# Patient Record
Sex: Female | Born: 1965 | Race: Black or African American | Hispanic: No | Marital: Married | Smoking: Former smoker
Health system: Southern US, Community
[De-identification: ages and names within clinical notes are randomized; demographics above are authoritative.]

## PROBLEM LIST (undated history)

## (undated) DIAGNOSIS — F419 Anxiety disorder, unspecified: Secondary | ICD-10-CM

## (undated) DIAGNOSIS — E119 Type 2 diabetes mellitus without complications: Secondary | ICD-10-CM

## (undated) DIAGNOSIS — D649 Anemia, unspecified: Secondary | ICD-10-CM

## (undated) DIAGNOSIS — N189 Chronic kidney disease, unspecified: Secondary | ICD-10-CM

## (undated) DIAGNOSIS — E042 Nontoxic multinodular goiter: Secondary | ICD-10-CM

## (undated) DIAGNOSIS — Z9109 Other allergy status, other than to drugs and biological substances: Secondary | ICD-10-CM

## (undated) DIAGNOSIS — E785 Hyperlipidemia, unspecified: Secondary | ICD-10-CM

## (undated) DIAGNOSIS — M5430 Sciatica, unspecified side: Secondary | ICD-10-CM

## (undated) DIAGNOSIS — I10 Essential (primary) hypertension: Secondary | ICD-10-CM

## (undated) DIAGNOSIS — E669 Obesity, unspecified: Secondary | ICD-10-CM

## (undated) DIAGNOSIS — F32A Depression, unspecified: Secondary | ICD-10-CM

## (undated) HISTORY — DX: Obesity, unspecified: E66.9

## (undated) HISTORY — DX: Morbid (severe) obesity due to excess calories: E66.01

## (undated) HISTORY — DX: Hyperlipidemia, unspecified: E78.5

## (undated) HISTORY — DX: Chronic kidney disease, unspecified: N18.9

## (undated) HISTORY — PX: PARTIAL HYSTERECTOMY: SHX80

## (undated) HISTORY — DX: Nontoxic multinodular goiter: E04.2

---
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
## 1997-08-20 ENCOUNTER — Emergency Department (HOSPITAL_COMMUNITY): Admission: EM | Admit: 1997-08-20 | Discharge: 1997-08-20 | Payer: Self-pay | Admitting: Emergency Medicine

## 1997-09-14 ENCOUNTER — Inpatient Hospital Stay (HOSPITAL_COMMUNITY): Admission: AD | Admit: 1997-09-14 | Discharge: 1997-09-14 | Payer: Self-pay | Admitting: Obstetrics

## 1997-09-17 ENCOUNTER — Inpatient Hospital Stay (HOSPITAL_COMMUNITY): Admission: AD | Admit: 1997-09-17 | Discharge: 1997-09-17 | Payer: Self-pay | Admitting: Obstetrics & Gynecology

## 1998-04-11 ENCOUNTER — Inpatient Hospital Stay (HOSPITAL_COMMUNITY): Admission: AD | Admit: 1998-04-11 | Discharge: 1998-04-11 | Payer: Self-pay | Admitting: Obstetrics & Gynecology

## 1998-04-11 ENCOUNTER — Encounter: Payer: Self-pay | Admitting: Obstetrics & Gynecology

## 1998-05-11 ENCOUNTER — Inpatient Hospital Stay (HOSPITAL_COMMUNITY): Admission: AD | Admit: 1998-05-11 | Discharge: 1998-05-11 | Payer: Self-pay | Admitting: Obstetrics

## 1998-05-23 ENCOUNTER — Encounter: Admission: RE | Admit: 1998-05-23 | Discharge: 1998-05-23 | Payer: Self-pay | Admitting: Obstetrics

## 1998-08-08 ENCOUNTER — Encounter: Admission: RE | Admit: 1998-08-08 | Discharge: 1998-08-08 | Payer: Self-pay | Admitting: Obstetrics

## 1999-10-31 ENCOUNTER — Inpatient Hospital Stay (HOSPITAL_COMMUNITY): Admission: AD | Admit: 1999-10-31 | Discharge: 1999-10-31 | Payer: Self-pay | Admitting: Obstetrics & Gynecology

## 1999-12-05 ENCOUNTER — Inpatient Hospital Stay (HOSPITAL_COMMUNITY): Admission: AD | Admit: 1999-12-05 | Discharge: 1999-12-05 | Payer: Self-pay | Admitting: *Deleted

## 1999-12-06 ENCOUNTER — Emergency Department (HOSPITAL_COMMUNITY): Admission: EM | Admit: 1999-12-06 | Discharge: 1999-12-06 | Payer: Self-pay | Admitting: *Deleted

## 1999-12-09 ENCOUNTER — Other Ambulatory Visit: Admission: RE | Admit: 1999-12-09 | Discharge: 1999-12-09 | Payer: Self-pay | Admitting: Obstetrics

## 1999-12-09 ENCOUNTER — Encounter: Admission: RE | Admit: 1999-12-09 | Discharge: 1999-12-09 | Payer: Self-pay | Admitting: Obstetrics & Gynecology

## 1999-12-11 ENCOUNTER — Ambulatory Visit (HOSPITAL_COMMUNITY): Admission: RE | Admit: 1999-12-11 | Discharge: 1999-12-11 | Payer: Self-pay | Admitting: *Deleted

## 2000-09-08 ENCOUNTER — Emergency Department (HOSPITAL_COMMUNITY): Admission: EM | Admit: 2000-09-08 | Discharge: 2000-09-08 | Payer: Self-pay | Admitting: Emergency Medicine

## 2000-09-09 ENCOUNTER — Emergency Department (HOSPITAL_COMMUNITY): Admission: EM | Admit: 2000-09-09 | Discharge: 2000-09-09 | Payer: Self-pay | Admitting: *Deleted

## 2002-05-18 ENCOUNTER — Inpatient Hospital Stay (HOSPITAL_COMMUNITY): Admission: AD | Admit: 2002-05-18 | Discharge: 2002-05-18 | Payer: Self-pay | Admitting: Family Medicine

## 2002-05-18 ENCOUNTER — Encounter: Payer: Self-pay | Admitting: Family Medicine

## 2002-11-09 ENCOUNTER — Inpatient Hospital Stay (HOSPITAL_COMMUNITY): Admission: AD | Admit: 2002-11-09 | Discharge: 2002-11-09 | Payer: Self-pay | Admitting: Obstetrics & Gynecology

## 2002-11-09 ENCOUNTER — Encounter: Payer: Self-pay | Admitting: Obstetrics & Gynecology

## 2002-11-28 ENCOUNTER — Encounter: Admission: RE | Admit: 2002-11-28 | Discharge: 2002-11-28 | Payer: Self-pay | Admitting: Obstetrics and Gynecology

## 2002-11-28 ENCOUNTER — Other Ambulatory Visit: Admission: RE | Admit: 2002-11-28 | Discharge: 2002-11-28 | Payer: Self-pay | Admitting: Obstetrics & Gynecology

## 2002-11-28 ENCOUNTER — Encounter (INDEPENDENT_AMBULATORY_CARE_PROVIDER_SITE_OTHER): Payer: Self-pay

## 2003-01-16 ENCOUNTER — Encounter: Admission: RE | Admit: 2003-01-16 | Discharge: 2003-01-16 | Payer: Self-pay | Admitting: Obstetrics and Gynecology

## 2003-01-23 ENCOUNTER — Emergency Department (HOSPITAL_COMMUNITY): Admission: EM | Admit: 2003-01-23 | Discharge: 2003-01-23 | Payer: Self-pay | Admitting: Emergency Medicine

## 2003-02-09 ENCOUNTER — Inpatient Hospital Stay (HOSPITAL_COMMUNITY): Admission: AD | Admit: 2003-02-09 | Discharge: 2003-02-09 | Payer: Self-pay | Admitting: Obstetrics & Gynecology

## 2003-02-12 ENCOUNTER — Emergency Department (HOSPITAL_COMMUNITY): Admission: EM | Admit: 2003-02-12 | Discharge: 2003-02-12 | Payer: Self-pay | Admitting: *Deleted

## 2003-10-19 ENCOUNTER — Ambulatory Visit: Payer: Self-pay | Admitting: Family Medicine

## 2003-10-19 ENCOUNTER — Inpatient Hospital Stay (HOSPITAL_COMMUNITY): Admission: AD | Admit: 2003-10-19 | Discharge: 2003-10-19 | Payer: Self-pay | Admitting: Family Medicine

## 2003-10-23 ENCOUNTER — Ambulatory Visit: Payer: Self-pay | Admitting: Obstetrics & Gynecology

## 2003-11-13 ENCOUNTER — Ambulatory Visit: Payer: Self-pay | Admitting: Obstetrics & Gynecology

## 2004-06-27 ENCOUNTER — Inpatient Hospital Stay (HOSPITAL_COMMUNITY): Admission: AD | Admit: 2004-06-27 | Discharge: 2004-06-27 | Payer: Self-pay | Admitting: *Deleted

## 2004-07-17 ENCOUNTER — Ambulatory Visit: Payer: Self-pay | Admitting: Obstetrics and Gynecology

## 2004-07-22 ENCOUNTER — Ambulatory Visit: Payer: Self-pay | Admitting: Obstetrics & Gynecology

## 2004-07-25 ENCOUNTER — Ambulatory Visit: Payer: Self-pay | Admitting: Family Medicine

## 2004-08-26 ENCOUNTER — Ambulatory Visit: Payer: Self-pay | Admitting: Obstetrics & Gynecology

## 2004-08-26 ENCOUNTER — Other Ambulatory Visit: Admission: RE | Admit: 2004-08-26 | Discharge: 2004-08-26 | Payer: Self-pay | Admitting: Obstetrics & Gynecology

## 2004-09-22 ENCOUNTER — Encounter (INDEPENDENT_AMBULATORY_CARE_PROVIDER_SITE_OTHER): Payer: Self-pay | Admitting: *Deleted

## 2004-09-22 ENCOUNTER — Ambulatory Visit: Payer: Self-pay | Admitting: Obstetrics & Gynecology

## 2004-09-22 ENCOUNTER — Inpatient Hospital Stay (HOSPITAL_COMMUNITY): Admission: AD | Admit: 2004-09-22 | Discharge: 2004-09-23 | Payer: Self-pay | Admitting: Obstetrics & Gynecology

## 2004-11-18 ENCOUNTER — Ambulatory Visit: Payer: Self-pay | Admitting: Obstetrics and Gynecology

## 2005-01-26 HISTORY — PX: ABDOMINAL HYSTERECTOMY: SHX81

## 2005-01-26 HISTORY — PX: TOTAL VAGINAL HYSTERECTOMY: SHX2548

## 2007-06-23 ENCOUNTER — Emergency Department (HOSPITAL_COMMUNITY): Admission: EM | Admit: 2007-06-23 | Discharge: 2007-06-23 | Payer: Self-pay | Admitting: Emergency Medicine

## 2007-06-23 ENCOUNTER — Encounter (INDEPENDENT_AMBULATORY_CARE_PROVIDER_SITE_OTHER): Payer: Self-pay | Admitting: Emergency Medicine

## 2007-06-23 ENCOUNTER — Ambulatory Visit: Payer: Self-pay | Admitting: Vascular Surgery

## 2009-08-06 ENCOUNTER — Emergency Department (HOSPITAL_COMMUNITY): Admission: EM | Admit: 2009-08-06 | Discharge: 2009-08-07 | Payer: Self-pay | Admitting: Emergency Medicine

## 2010-06-13 NOTE — Group Therapy Note (Signed)
NAME:  CEDRICA, BRUNE NO.:  [id number]   MEDICAL RECORD NO.:  [id number]          PATIENT TYPE:  WOC   LOCATION:  [facility]                   FACILITY:  [facility]   PHYSICIAN:  Argentina Donovan, MD        DATE OF BIRTH:  July 14, 1965   DATE OF SERVICE:  11/18/2004                                    CLINIC NOTE   The patient is a 45 year old black female who on the 28th of last month  underwent total vaginal hysterectomy.  Benign proliferative endometrium,  disorganized was her diagnosis post pathology.  On examination she had no  significant symptoms.  The vaginal cuff completely healed and there is very  little pain on bimanual examination.  Because of the habitus of the patient  I could not feel the ovaries.  She weighs 301 pounds.   DIAGNOSES:  Post vaginal hysterectomy, well healed.  Patient discharged.  Will order her lisinopril for blood pressure and renew her __________ .           ______________________________  Argentina Donovan, MD     PR/MEDQ  D:  11/18/2004  T:  11/19/2004  Job:  [id number]

## 2010-06-13 NOTE — Op Note (Signed)
NAME:  Rebecca Avila, Rebecca Avila                  ACCOUNT NO.:  [id number]   MEDICAL RECORD NO.:  [id number]          PATIENT TYPE:  INP   LOCATION:  9399                          [facility]   PHYSICIAN:  Lesly Dukes, M.D. DATE OF BIRTH:  [birth year]   DATE OF PROCEDURE:  09/22/2004  DATE OF DISCHARGE:                                 OPERATIVE REPORT   PREOPERATIVE DIAGNOSIS:  A 45 year old female with menorrhagia, fibroids,  adenomas, and anemia.   POSTOPERATIVE DIAGNOSIS:  A 45 year old female with menorrhagia, fibroids,  adenomas, and anemia.   PROCEDURE:  Transvaginal hysterectomy.   ASSISTANT:  Tanya S. Shawnie Pons, M.D.   ANESTHESIA:  General.   PATHOLOGY:  Uterus and cervix.   ESTIMATED BLOOD LOSS:  300.   COMPLICATIONS:  None.   FINDINGS:  Enlarged uterus weighing 214 g, mild cystocele, grossly normal  ovaries visually.   PROCEDURE:  After informed consent was obtained, the patient was taken to  the operating room and general anesthesia was induced.  The patient was  placed in the dorsal lithotomy position and dressed in the normal sterile  fashion.  Retractors were placed through the vagina and the cervix was  grasped with Christella Hartigan tenaculum.  The cervix was infiltrated with a 1%  lidocaine with 1:200,000 epinephrine solution.  The cervix was then  circumferentially incised with a scalpel until the level of the endopelvic  fascia.  The peritoneum was then identified anteriorly and entered with the  Metzenbaum scissors.  The Pratts were placed intraperitoneally.  The same  procedure was used to enter the peritoneum posteriorly and a long weighted  speculum was placed into the peritoneal cavity.  Heaney clamps were used to  clamp the uterosacral ligaments and uterine arteries.  Each pedicle was then  transected and the tissue ligated with 0 Vicryl.  Good hemostasis was noted.  The uterus was then bivalved once the uterine arteries were transected and  found to be hemostatic.   The utero-ovarian pedicles were then doubly  clamped, transected, and suture ligated with 0 Vicryl x2.  Good hemostasis  was noted from the utero-ovarian pedicles.  At this time, we saw the  ovaries, which looked grossly normal.  The vaginal cuff and posterior  peritoneum were then run in a locked fashion to aid hemostasis.  All  pedicles were examined one last time and all pedicles were noted to be  hemostatic.  The vaginal cuff was then closed with 0 Vicryl in a running  fashion.  The vagina was packed with 2 inch packing  tape soaked in Cleocin cream.  The patient tolerated the procedure well.  Sponge, laparotomy pad, instrument, and needle count were correct x2.  The  patient went to the recovery room in stable condition.  A Foley was placed  in the bladder at the end of the procedure.           ______________________________  Lesly Dukes, M.D.     KHL/MEDQ  D:  09/22/2004  T:  09/22/2004  Job:  [id number]

## 2010-06-13 NOTE — Group Therapy Note (Signed)
NAME:  Rebecca Avila, Rebecca Avila NO.:  [id number]   MEDICAL RECORD NO.:  [id number]          PATIENT TYPE:  WOC   LOCATION:  [facility]                   [facility]ACILITY[facility]   PHYSICIAN:  Elsie Lincoln, MD      DATE OF BIRTH:  08-29-1965   DATE OF SERVICE:  07/22/2004                                    CLINIC NOTE   HISTORY:  The patient is a 45 year old P70 female with LMP June 26, 2004, who  re-presents to the clinic for a complaint of severe dysmenorrhea.  The  patient most likely has adenomyosis.  She was scheduled for a hysterectomy  in 2005, after receiving Lupron to help her menorrhagia and anemia.  She  never showed up for surgery.  There were some sexual issues, and these are  outlined in previous notes.   The patient now is ready to have a hysterectomy, and has Medicaid.  Her  hemoglobin today is 10, and she desires to get one more shot of Lupron to  prevent her from having menses until surgery can be scheduled.  She is still  hypertensive, which has not received treatment.  I told her that she needs  to see a doctor before I schedule the surgery.  She also needs an  endometrial biopsy and Pap smear, as it has been almost two years.  The  patient is scheduled for that in three weeks.  In the meantime, she will  come back on Friday for a Lupron injection.  She is also given a  prescription for Vicodin to help her with the menses that will occur after  getting the Lupron.   The patient denies any new medical problems or surgeries.  We will schedule  her for a vaginal hysterectomy in the next four to six weeks.       KL/MEDQ  D:  07/22/2004  T:  07/22/2004  Job:  [id number]

## 2010-06-13 NOTE — Group Therapy Note (Signed)
NAME:  Rebecca Avila, Rebecca Avila NO.:  [id number]   MEDICAL RECORD NO.:  [id number]          PATIENT TYPE:  WOC   LOCATION[facility]                   [facility]   PHYSICIAN:  Elsie Lincoln, MD      DATE OF BIRTH:  16-Jan-1966   DATE OF SERVICE:  10/23/2003                                    CLINIC NOTE   REASON FOR VISIT:  The patient is a 45 year old female para 1-0-1-1 who was  worked up last year for a hysterectomy for adenomyosis.  The patient had  received blood transfusion secondary to a low hematocrit and also Lupron in  order to help her anemia.  The patient was scheduled for surgery early  January and was placed on iron.  She was sent four certified letters that  were all returned.  The patient states that she was not in a good state to  have the surgery at that time.  The patient then presented to the MAU on  October 19, 2003 with pain.  She was given a prescription for Percocet and  she had a hemoglobin of 9.4 that day.  Today she presents now desiring  hysterectomy.  She now has a stable house, her husband is out of jail, and  she has a working phone number.  She was given a shot of Lupron today in  order to help shrink the uterus again and aid in resolving her anemia.  Her  blood pressure today was 169/103.  She was told back in 2004 to get follow-  up for this hypertension before the surgery.  Now that she has gained 20  more pounds, her blood pressure is higher.  She was given an appointment  today for family practice in order to get her blood pressure under control  prior to surgery.  Once again, the patient was give a prescription for  Lupron and is going to come back tomorrow for the injection, and then return  to clinic in 8 weeks for a preoperative visit and to schedule surgery.  The  patient understands that we will attempt a transvaginal hysterectomy;  however, if not possible we will proceed with transabdominal hysterectomy.      KL/MEDQ   D:  10/23/2003  T:  10/23/2003  Job:  [id number]

## 2010-06-13 NOTE — Discharge Summary (Signed)
NAME:  Rebecca Avila, Rebecca Avila                  ACCOUNT NO.:  [id number]   MEDICAL RECORD NO.:  [id number]          PATIENT TYPE:  INP   LOCATION:  9305                          [facility]   PHYSICIAN:  Lesly Dukes, M.D. DATE OF BIRTH:  [birth year]   DATE OF ADMISSION:  09/22/2004  DATE OF DISCHARGE:  09/23/2004                                 DISCHARGE SUMMARY   ADMISSION DIAGNOSES:  1.  Menorrhagia.  2.  Fibroids.  3.  Adenomas.  4.  Anemia.   DISCHARGE DIAGNOSIS:  Status post transvaginal hysterectomy for menorrhagia,  fibroids, adenomas and anemia.   PERTINENT LABORATORY DATA:  Discharge hemoglobin 8.9, hematocrit 29.0, white  count 13.2 thousand, platelets 392,000.   HOSPITAL COURSE:  this is a 45 year old African-American female with a  history of menorrhagia, fibroids and adenomas who underwent a total vaginal  hysterectomy without any complications. The patient was discharged home on  postoperative day number 1 in good condition. Her blood pressures were  elevated during her hospital stay so she was started on Lisinopril 5 mg  daily and will continue this at discharge. The patient remained afebrile  throughout her hospital course and was ambulating and tolerating p.o. on the  day of discharge.   DISPOSITION:  She is discharged home on a regular diet. Activity as  tolerated.   MEDICATIONS:  1.  Lisinopril 5 mg p.o. daily.  2.  Percocet 5/325 mg 1-2 tablets q.4-6h p.r.n. pain.   DISCHARGE INSTRUCTIONS:  She is to take all medications as prescribed. She  is to follow up with Dr. Penne Lash at our clinic in 2 weeks.      Altamese Cabal, M.D.    ______________________________  Lesly Dukes, M.D.    KS/MEDQ  D:  09/23/2004  T:  09/24/2004  Job:  [id number]

## 2010-06-13 NOTE — H&P (Signed)
NAME:  Rebecca Avila, Rebecca Avila NO.:  [id number]   MEDICAL RECORD NO.:  [id number]          PATIENT TYPE:  WOC   LOCATION:  [facility]                   [facility]ACILITY[facility]   PHYSICIAN:  Elsie Lincoln, MD      DATE OF BIRTH:  Oct 26, 1965   DATE OF SERVICE:  08/26/2004                             PRE-OP HISTORY & PHYSICAL   The patient is a 45 year old G2, para 1-0-1-1 who has had severe  dysmenorrhea and menorrhagia since 2004.  The patient has been on Lupron to  help her anemia.  She most likely has adenomyosis on ultrasound.  She does  not desire to have any birth control pills or endometrial ablation and these  things are unlikely to work given it is probably adenomyosis.  Patient is  scheduled for a transvaginal hysterectomy on August 28 at 9:30.   PAST MEDICAL HISTORY:  Hypertension.  Patient has not gone for medication  for this at a primary care physician.  I have given her hydrochlorothiazide  in the past; however, she says it makes her jittery.  Patient was started on  lisinopril today 5 mg p.o. daily.  Patient is to come in if there is  increased swelling or a hacking dry cough.   PAST SURGICAL HISTORY:  None.   PAST GYNECOLOGICAL HISTORY:  One spontaneous AB and one NSVD of an 8 pound  baby.  Patient denies history of sexually transmitted diseases, ovarian  cysts, fibroids, or Pap smears that are not normal.   SOCIAL HISTORY:  Patient is a smoker of approximately three cigarettes a  day.  No alcohol or drugs.   FAMILY HISTORY:  No breast, uterine, colon, or ovarian cancer.  No diabetes.   REVIEW OF SYSTEMS:  No chest pain, shortness of breath, nausea, vomiting,  diarrhea, or change in bladder habits.  She does have occasional headache,  but none today.   PHYSICAL EXAMINATION:  VITAL SIGNS:  Temperature 97.3, pulse 85, blood  pressure 149/100, weight 298.3, height 5 feet 10 inches.  GENERAL:  Well-nourished, well-developed, in no apparent distress.  NECK:   Supple, enlarged bilateral homogenous thyroid.  However, patient  states she has had multiple TSHs in the past and they have all been normal.  CHEST:  Clear to auscultation bilaterally.  CARDIOVASCULAR:  Regular rate and rhythm.  ABDOMEN:  Soft, obese, nontender, nondistended.  No rebound.  No guarding.  GENITALIA:  Tanner V.  There is a small folliculitis on the left labia  majora that is draining.  Patient has had several of these now that the  weather is hot and she is encouraged to take sitz baths twice a day.  Vagina  pink, normal rugae.  Cervix closed, nontender.  No CMT.  Uterus anteverted,  slightly enlarged.  Adnexa:  No masses.  Nontender.  EXTREMITIES:  Slight edema.  No clubbing in the lower extremities.   ASSESSMENT/PLAN:  A 45 year old female with adenomyosis, severe  dysmenorrhea, and anemia.  1.  TVH on August 28.  2.  Check TSH and CMP today as starting an ACE.  3.  Lisinopril 5 mg p.o. daily.  Patient is to show up for surgery on the      28th.       KL/MEDQ  D:  08/26/2004  T:  08/26/2004  Job:  [id number]

## 2010-06-13 NOTE — Group Therapy Note (Signed)
NAME:  Rebecca Avila, Rebecca Avila NO.:  [id number]   MEDICAL RECORD NO.:  [id number]                   PATIENT TYPE:  OUT   LOCATION:  [facility]                           [facility]ACILITY[facility]   PHYSICIAN:  Elsie Lincoln, MD                   DATE OF BIRTH:  January 25, 1966   DATE OF SERVICE:  11/28/2002                                    CLINIC NOTE   REASON FOR VISIT:  This is a 45 year old G 2, para 1-0-1-1 who presents for  followup of a MAU visit on November 09, 2002.  The patient was having her  period at that time and having a lot of abdominal pain.  She had been  diagnosed with adenomyosis previously and had been having painful periods  for several years.  The patient states she is through with childbearing and  would like a hysterectomy.  On the day that she was seen in the MAU, she was  noted to be anemic with a hemoglobin of 7.8.  GC, Chlamydia, and wet prep  were negative.  The uterus on ultrasound was mildly enlarged measuring 11.9  x 7.6 x 7.5.  The endometrial stripe was ill-defined but was within normal  limits about 7.5 mm.  The myometrial echotexture was heterogenous,  particularly anteriorly, consistent with adenomyosis.  She was noted to be  hypertensive at her MAU visit and was instructed to get treated for her  hypertension by a primary medical doctor.   PAST MEDICAL HISTORY:  Hypertension.   PAST SURGICAL HISTORY:  None.   PAST GYNECOLOGICAL HISTORY:  One spontaneous abortion, one NSVD of an 8  pound baby.  No sexually transmitted diseases.  No ovarian cysts.  No  fibroids, only adenomyosis as described above.   SOCIAL HISTORY:  The patient is a smoker approximately 3 cigarettes a day.  No alcohol.  No drugs.   FAMILY HISTORY:  No breast, uterine, colon, or ovarian cancers.  No  diabetes.   REVIEW OF SYSTEMS:  No chest pain, shortness of breath, nausea, vomiting,  diarrhea, or change in bladder habits.  The patient does have migraines.  Experiences occasional headaches.   PHYSICAL EXAMINATION:  VITAL SIGNS:  Pulse 77, blood pressure 159/96, weight  293.1, height 5 feet 10 inches.  BREASTS:  No masses.  No skin changes.  Nontender.  ABDOMEN:  Obese.  Nontender.  Distended.  EXTERNAL GENITALIA:  Tanner V.  VAGINA:  Normal pink mucosa.  No discharge or blood.  CERVIX:  Closed, nontender.  No lesion.  UTERUS:  Difficult to palpate but nontender.  This is secondary to body  habitus.  ADNEXA:  No masses.  Nontender.   ASSESSMENT:  A 45 year old para 1-0-1-1 who is finished with childbearing  and had suffered from adenomyosis and desiring hysterectomy.  The patient  also has severe anemia secondary to her heavy menses.   PLAN:  1. Pap smear done today.  2. Endometrial biopsy done today.  3. The patient advised to quit smoking and change diet to help weight loss.  4. Referred to [facility] within a week to get treated for     hypertension.  5. The patient amenorrheic secondary to a three-month dose of Lupron that     she received in MAU.  Desires to have hysterectomy within the three     months so the patient will not experience menses again.  6. Continue iron three times a day.  7. Return to clinic in six weeks for preop, consultation, consent, and labs.                                               Elsie Lincoln, MD    KL/MEDQ  D:  11/28/2002  T:  11/28/2002  Job:  [id number]

## 2010-06-13 NOTE — Group Therapy Note (Signed)
NAME:  Rebecca Avila, Rebecca Avila NO.:  [id number]   MEDICAL RECORD NO.:  [id number]                   PATIENT TYPE:  OUT   LOCATION:  [facility]                           [facility]ACILITY:  [facility]   PHYSICIAN:  Elsie Lincoln, MD                   DATE OF BIRTH:  1965/09/02   DATE OF SERVICE:  01/16/2003                                    CLINIC NOTE   REPORT TITLE:  GYNECOLOGY CLINIC   REASON FOR VISIT:  A 45 year old female para 1-0-1-1.  This is the  preoperative visit for a hysterectomy.  The patient has a history of anemia  from menorrhagia and lots of abdominal pain from adenomyosis.  The patient  received Lupron on November 09, 2002 and has been amenorrheic and undergoing  the climacteric symptoms with medical menopause.  Pap smear and endometrial  biopsy were negative since then.  The patient still desires a hysterectomy.  She does not want medical management or a Mirena IUD.  The patient  understands that the risks of this procedure includes but is not limited to  bleeding, infection, damage to the bowel and bladder and major blood  vessels.  After discussion about route we will attempt a vaginal  hysterectomy.  However, if there is not a lot of descent of the uterus then  we will proceed with abdominal hysterectomy via Pfannenstiel incision.  The  patient agrees to this.  The ovaries will be left behind.   Of note, the patient did not go to the medical appointment to have her  borderline hypertension evaluated.  Today it is 139/91, weight is 287.6 and  this is a 5 pound weight loss.  The patient has not been taking the iron  that has been prescribed.  Her last CBC her hemoglobin was 7.8 in October.  I believe this has gone up as she has been amenorrheic for several months.  We will schedule this surgery early January before the Lupron stops taking  effect.  The patient is given another prescription and samples for Chromagen  and once again we will schedule the  surgery early January.  Colace is also  given for constipation and the patient is to do a bowel prep with GoLYTELY  starting at 3 p.m. the day before the surgery.                                               Elsie Lincoln, MD    KL/MEDQ  D:  01/16/2003  T:  01/16/2003  Job:  [id number]

## 2011-02-05 ENCOUNTER — Observation Stay (HOSPITAL_COMMUNITY)
Admission: EM | Admit: 2011-02-05 | Discharge: 2011-02-06 | Disposition: A | Discharge status: Home / Self Care | Payer: Self-pay | Attending: Emergency Medicine | Admitting: Emergency Medicine

## 2011-02-05 ENCOUNTER — Encounter (HOSPITAL_COMMUNITY): Payer: Self-pay

## 2011-02-05 DIAGNOSIS — T782XXA Anaphylactic shock, unspecified, initial encounter: Secondary | ICD-10-CM

## 2011-02-05 DIAGNOSIS — T39091A Poisoning by salicylates, accidental (unintentional), initial encounter: Secondary | ICD-10-CM | POA: Insufficient documentation

## 2011-02-05 DIAGNOSIS — I10 Essential (primary) hypertension: Secondary | ICD-10-CM | POA: Insufficient documentation

## 2011-02-05 DIAGNOSIS — F17200 Nicotine dependence, unspecified, uncomplicated: Secondary | ICD-10-CM | POA: Insufficient documentation

## 2011-02-05 DIAGNOSIS — Y92009 Unspecified place in unspecified non-institutional (private) residence as the place of occurrence of the external cause: Secondary | ICD-10-CM | POA: Insufficient documentation

## 2011-02-05 DIAGNOSIS — T39094A Poisoning by salicylates, undetermined, initial encounter: Principal | ICD-10-CM | POA: Insufficient documentation

## 2011-02-05 HISTORY — DX: Essential (primary) hypertension: I10

## 2011-02-05 HISTORY — DX: Other allergy status, other than to drugs and biological substances: Z91.09

## 2011-02-05 LAB — BASIC METABOLIC PANEL
CO2: 25 mEq/L (ref 19–32)
Calcium: 10.8 mg/dL — ABNORMAL HIGH (ref 8.4–10.5)
Glucose, Bld: 124 mg/dL — ABNORMAL HIGH (ref 70–99)

## 2011-02-05 MED ORDER — EPINEPHRINE 0.3 MG/0.3ML IJ DEVI
INTRAMUSCULAR | Status: AC
  Administered 2011-02-05: 0.3 mg via INTRAMUSCULAR
  Filled 2011-02-05: qty 0.3

## 2011-02-05 MED ORDER — FAMOTIDINE 10 MG PO TABS: ORAL_TABLET | ORAL | Status: DC

## 2011-02-05 MED ORDER — FAMOTIDINE IN NACL 20-0.9 MG/50ML-% IV SOLN: 20.0000 mg | Freq: Two times a day (BID) | INTRAVENOUS | Status: DC

## 2011-02-05 MED ORDER — METHYLPREDNISOLONE SODIUM SUCC 125 MG IJ SOLR
125.0000 mg | Freq: Once | INTRAMUSCULAR | Status: AC
  Administered 2011-02-05: 125 mg via INTRAVENOUS
  Filled 2011-02-05: qty 2

## 2011-02-05 MED ORDER — EPINEPHRINE 0.3 MG/0.3ML IJ DEVI
0.3000 mg | Freq: Once | INTRAMUSCULAR | Status: AC
  Administered 2011-02-05: 0.3 mg via INTRAMUSCULAR

## 2011-02-05 MED ORDER — DIPHENHYDRAMINE HCL 25 MG PO CAPS: 25.0000 mg | ORAL_CAPSULE | Freq: Four times a day (QID) | ORAL | Status: DC | PRN

## 2011-02-05 MED ORDER — DIPHENHYDRAMINE HCL 50 MG/ML IJ SOLN
50.0000 mg | Freq: Once | INTRAMUSCULAR | Status: AC
  Administered 2011-02-05: 50 mg via INTRAVENOUS
  Filled 2011-02-05: qty 1

## 2011-02-05 MED ORDER — METHYLPREDNISOLONE SODIUM SUCC 125 MG IJ SOLR
125.0000 mg | Freq: Four times a day (QID) | INTRAMUSCULAR | Status: DC
  Administered 2011-02-05: 125 mg via INTRAVENOUS
  Filled 2011-02-05: qty 2

## 2011-02-05 MED ORDER — PREDNISONE 10 MG PO TABS: 20.0000 mg | ORAL_TABLET | Freq: Two times a day (BID) | ORAL | Status: DC

## 2011-02-05 MED ORDER — RANITIDINE HCL 150 MG/10ML PO SYRP
150.0000 mg | ORAL_SOLUTION | Freq: Once | ORAL | Status: AC
  Administered 2011-02-05: 150 mg via ORAL
  Filled 2011-02-05 (×2): qty 10

## 2011-02-05 MED ORDER — EPINEPHRINE 0.3 MG/0.3ML IJ DEVI: 0.3000 mg | INTRAMUSCULAR | Status: DC | PRN

## 2011-02-05 MED ORDER — ONDANSETRON HCL 4 MG/2ML IJ SOLN
4.0000 mg | Freq: Once | INTRAMUSCULAR | Status: AC
  Administered 2011-02-05: 4 mg via INTRAVENOUS
  Filled 2011-02-05: qty 2

## 2011-02-05 MED ORDER — FAMOTIDINE 20 MG PO TABS
20.0000 mg | ORAL_TABLET | Freq: Two times a day (BID) | ORAL | Status: DC
  Administered 2011-02-05: 20 mg via ORAL
  Filled 2011-02-05: qty 1

## 2011-02-05 NOTE — ED Notes (Signed)
Bedside report given to [facility]

## 2011-02-05 NOTE — ED Notes (Signed)
Pt states that she was having some abdominal issues this afternoon so she decided to take some alka seltzer. She started to feel itching in the back of her throat and looked at the ingredient list and saw that it contained asa. 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 Pt states that her eyes are swelling and her throat is itching and she feels like her lower lip is starting to swell. Pt took 25mg  benadryl po pta with no relief. Alert and oriented.

## 2011-02-05 NOTE — ED Provider Notes (Signed)
History     CSN: [id number]  Arrival date & time 02/05/11  1642   First MD Initiated Contact with Patient 02/05/11 1705      Chief Complaint  Patient presents with  . Allergic Reaction    (Consider location/radiation/quality/duration/timing/severity/associated sxs/prior treatment) HPI history obtained from patient Level V caveat for urgent intervention needed Patient is a 46 year old female who presents with allergic reaction. 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 She had mild indigestion earlier today and took one dose of Alka-Seltzer. This is about 1-1-1/2 hours prior to arrival. Shortly after that she started noticing tingling in her lips as well as facial swelling. She had mild erythematous, pruritic rash on her face and left forearm. She also had subjective sensation of her throat closing up. She immediately came to the emergency department once the symptoms started. The symptoms have been constant and progressively worsening. She has not had any wheezing. No syncope, nausea, vomiting, diarrhea. She has had extreme reaction to above-mentioned medications similar to this in past. She has never required intubation. She does not have an EpiPen. Overall severity moderate to severe.  Past Medical History  Diagnosis Date  . Hypertension   . Environmental allergies     Past Surgical History  Procedure Date  . Partial hysterectomy     History reviewed. No pertinent family history.  History  Substance Use Topics  . Smoking status: Current Everyday Smoker  . Smokeless tobacco: Not on file  . Alcohol Use: No    OB History    Grav Para Term Preterm Abortions TAB SAB Ect Mult Living                  Review of Systems  Constitutional: Negative for fever and chills.  HENT: Negative for facial swelling.   Eyes: Negative for visual disturbance.  Respiratory: Positive for shortness of breath. Negative for cough and wheezing.   Cardiovascular: Negative for chest  pain.  Gastrointestinal: Negative for nausea, vomiting, abdominal pain and diarrhea.  Genitourinary: Negative for difficulty urinating.  Skin: Negative for rash.  Neurological: Negative for weakness and numbness.  Psychiatric/Behavioral: Negative for behavioral problems and confusion.  All other systems reviewed and are negative.    Allergies  Aspirin and Ibuprofen  Home Medications  No current outpatient prescriptions on file.  BP 171/96  Pulse 81  Temp(Src) 98 F (36.7 C) (Oral)  Resp 17  SpO2 98%  Physical Exam  Nursing note and vitals reviewed. Constitutional: She is oriented to person, place, and time. She appears well-developed and well-nourished. No distress.  HENT:       Moderate bilateral upper eyelid swelling. Patient still able to open eyes. Mild to moderate lower lip edema. No trismus. Patient will open her mouth fully and extend tongue. No tongue swelling. Subjective sensation of throat swelling up but no objective findings to support this. Mild erythematous rash over bilateral eyes.  Eyes: EOM are normal.  Neck: Normal range of motion. Neck supple.  Cardiovascular: Normal rate, regular rhythm and intact distal pulses.   No murmur heard. Pulmonary/Chest: Effort normal and breath sounds normal. No respiratory distress. She has no wheezes. She exhibits no tenderness.  Abdominal: Soft. She exhibits no distension. There is no tenderness.  Musculoskeletal: Normal range of motion. She exhibits no edema and no tenderness.       No calf TTP  Neurological: She is alert and oriented to person, place, and time.       Normal strength  Skin:  Skin is warm and dry. No rash noted. She is not diaphoretic.  Psychiatric: She has a normal mood and affect. Her behavior is normal. Thought content normal.    ED Course  Procedures (including critical care time)  Labs Reviewed  BASIC METABOLIC PANEL - Abnormal; Notable for the following:    Glucose, Bld 124 (*)    Calcium 10.8  (*)    GFR calc non Af Amer 70 (*)    GFR calc Af Amer 81 (*)    All other components within normal limits  BASIC METABOLIC PANEL   No results found.   1. Anaphylaxis       MDM   Based on patient's known allergy to salicylates and abrupt onset of facial swelling, dyspnea, erythematous rash shortly after taking Alka-Seltzer, clinical picture is consistent with anaphylactic reaction. Patient immediately given IM epinephrine, Solu-Medrol, Benadryl, H2 blocker. On initial exam she did have facial swelling but did not have significant respiratory compromise or hypoxia. She did not have wheezing or other evidence of distal constriction. She had significant symptomatic improvement with her initial interventions. Patient placed in allergic reaction observation protocol and transfer to CDU in improved and stable condition. There was no need to re dose epinephrine. Patient does have hypertension and has chronic lower extremity edema. Based on this screening metabolic panel done which was unremarkable. Patient will be discharged home with EpiPen.        Milus Glazier 02/05/11 2157

## 2011-02-05 NOTE — ED Notes (Signed)
Assumed care from [place], [place]. Pt assessed. Pt continues to have significant eye and tounge edema. Airway intact with clear breath sounds. Lips reportedly swollen as well. VSS. Will continue to monitor

## 2011-02-05 NOTE — ED Notes (Signed)
After patient was laying on stretcher stated slight nausea notified EDP administered antinausea medication and currently nausea resolved.

## 2011-02-05 NOTE — ED Notes (Signed)
Patient states at 1530 developed acid reflux and indigestion Alka seltzer.  At 1600 patient felt lips burning and swelling. Eyes itching with swelling periorbital both eyes, and itchy throat.  Took benadryl 1620 25mg  po. No relief. Upon arrival to ED Airway intact Bilateral equal chest rise and fall clear lung sounds in all fields. Periorbital swelling both eyes and itching throat. Ax4.  After patient received medication patient currently states itchy throat improving and burning sensation lips resolved. Swelling periorbital still present.

## 2011-02-05 NOTE — ED Provider Notes (Signed)
46 year old female with a history of aspirin allergy and developed an allergic reaction following taking a dose of Alka-Seltzer. She was not aware that Alka-Seltzer contains aspirin. She's been treated in the emergency department with steroids, epinephrine, H1 and H2 blockers and is feeling much better. She's not having any difficulty with her secretions, but she still has some swelling in her eyes and face. Incidentally, she has significant extremity edema which apparently is chronic and she was not aware of.  Rebecca Booze, MD 02/05/11 [phone]

## 2011-02-05 NOTE — ED Provider Notes (Signed)
I saw and evaluated the patient, reviewed the resident's note and I agree with the findings and plan.  CRITICAL CARE Performed by: Dione Booze   Total critical care time: 35 minutes  Critical care time was exclusive of separately billable procedures and treating other patients.  Critical care was necessary to treat or prevent imminent or life-threatening deterioration.  Critical care was time spent personally by me on the following activities: development of treatment plan with patient and/or surrogate as well as nursing, discussions with consultants, evaluation of patient's response to treatment, examination of patient, obtaining history from patient or surrogate, ordering and performing treatments and interventions, ordering and review of laboratory studies, ordering and review of radiographic studies, pulse oximetry and re-evaluation of patient's condition.   Dione Booze, MD 02/05/11 [phone]

## 2011-02-05 NOTE — ED Notes (Signed)
Provider notified of early dose methylpredisolone  adminstration time to be change to 23:30 per EDP

## 2011-05-20 ENCOUNTER — Encounter (HOSPITAL_COMMUNITY): Payer: Self-pay | Admitting: Emergency Medicine

## 2011-05-20 ENCOUNTER — Emergency Department (HOSPITAL_COMMUNITY): Payer: Self-pay

## 2011-05-20 ENCOUNTER — Emergency Department (HOSPITAL_COMMUNITY)
Admission: EM | Admit: 2011-05-20 | Discharge: 2011-05-21 | Disposition: A | Discharge status: Home / Self Care | Payer: Self-pay | Attending: Emergency Medicine | Admitting: Emergency Medicine

## 2011-05-20 DIAGNOSIS — R0989 Other specified symptoms and signs involving the circulatory and respiratory systems: Secondary | ICD-10-CM | POA: Insufficient documentation

## 2011-05-20 DIAGNOSIS — M79609 Pain in unspecified limb: Secondary | ICD-10-CM | POA: Insufficient documentation

## 2011-05-20 DIAGNOSIS — R0600 Dyspnea, unspecified: Secondary | ICD-10-CM

## 2011-05-20 DIAGNOSIS — R0609 Other forms of dyspnea: Secondary | ICD-10-CM | POA: Insufficient documentation

## 2011-05-20 DIAGNOSIS — R609 Edema, unspecified: Secondary | ICD-10-CM | POA: Insufficient documentation

## 2011-05-20 DIAGNOSIS — R0602 Shortness of breath: Secondary | ICD-10-CM | POA: Insufficient documentation

## 2011-05-20 DIAGNOSIS — K625 Hemorrhage of anus and rectum: Secondary | ICD-10-CM | POA: Insufficient documentation

## 2011-05-20 DIAGNOSIS — M7989 Other specified soft tissue disorders: Secondary | ICD-10-CM | POA: Insufficient documentation

## 2011-05-20 DIAGNOSIS — I10 Essential (primary) hypertension: Secondary | ICD-10-CM | POA: Insufficient documentation

## 2011-05-20 LAB — CBC
HCT: 39.9 % (ref 36.0–46.0)
Hemoglobin: 13.2 g/dL (ref 12.0–15.0)
MCH: 27.4 pg (ref 26.0–34.0)
MCHC: 33.1 g/dL (ref 30.0–36.0)
MCV: 83 fL (ref 78.0–100.0)
RDW: 14.1 % (ref 11.5–15.5)

## 2011-05-20 LAB — BASIC METABOLIC PANEL
BUN: 14 mg/dL (ref 6–23)
Creatinine, Ser: 0.97 mg/dL (ref 0.50–1.10)
GFR calc Af Amer: 81 mL/min — ABNORMAL LOW (ref >90)
GFR calc non Af Amer: 70 mL/min — ABNORMAL LOW (ref >90)
Glucose, Bld: 117 mg/dL — ABNORMAL HIGH (ref 70–99)

## 2011-05-20 LAB — POCT I-STAT, CHEM 8
BUN: 14 mg/dL (ref 6–23)
Calcium, Ion: 1.43 mmol/L — ABNORMAL HIGH (ref 1.12–1.32)
Chloride: 102 mEq/L (ref 96–112)
Creatinine, Ser: 1 mg/dL (ref 0.50–1.10)
Glucose, Bld: 109 mg/dL — ABNORMAL HIGH (ref 70–99)

## 2011-05-20 MED ORDER — MORPHINE SULFATE 4 MG/ML IJ SOLN
4.0000 mg | Freq: Once | INTRAMUSCULAR | Status: AC
  Administered 2011-05-20: 4 mg via INTRAVENOUS
  Filled 2011-05-20: qty 1

## 2011-05-20 NOTE — ED Notes (Signed)
Patient transported to X-ray 

## 2011-05-20 NOTE — ED Provider Notes (Signed)
History     CSN: [id number]  Arrival date & time 05/20/11  1925   First MD Initiated Contact with Patient 05/20/11 2150      Chief Complaint  Patient presents with  . Shortness of Breath  . Leg Pain  . Rectal Bleeding    (Consider location/radiation/quality/duration/timing/severity/associated sxs/prior treatment) Patient is a 46 y.o. female presenting with leg pain and shortness of breath. The history is provided by the patient.  Leg Pain  The incident occurred more than 2 days ago (4 days ago). Incident location: no injury. There was no injury mechanism. The pain is present in the right leg. The quality of the pain is described as aching and throbbing. The pain is severe. The pain has been constant since onset. Pertinent negatives include no numbness and no loss of motion. She reports no foreign bodies present. The symptoms are aggravated by bearing weight, palpation and activity. She has tried nothing for the symptoms.  Shortness of Breath  The current episode started 2 days ago. The problem occurs occasionally. The problem has been unchanged. The problem is mild. Associated symptoms include shortness of breath. Pertinent negatives include no chest pain and no cough. She has received no recent medical care.    Past Medical History  Diagnosis Date  . Hypertension   . Environmental allergies     Past Surgical History  Procedure Date  . Partial hysterectomy     No family history on file.  History  Substance Use Topics  . Smoking status: Current Everyday Smoker  . Smokeless tobacco: Not on file  . Alcohol Use: No    OB History    Grav Para Term Preterm Abortions TAB SAB Ect Mult Living                  Review of Systems  Constitutional: Negative for fatigue.  HENT: Negative for neck pain.   Respiratory: Positive for chest tightness and shortness of breath. Negative for cough.   Cardiovascular: Negative for chest pain.  Gastrointestinal: Negative for nausea,  vomiting, abdominal pain and diarrhea.  Genitourinary: Negative for dysuria.  Skin: Negative for rash.  Neurological: Negative for numbness and headaches.  All other systems reviewed and are negative.    Allergies  Aspirin and Ibuprofen  Home Medications   Current Outpatient Rx  Name Route Sig Dispense Refill  . ACETAMINOPHEN 500 MG PO TABS Oral Take 1,000 mg by mouth every 6 (six) hours as needed. For pain    . FAMOTIDINE 10 MG PO TABS  Take one po bid x 3 days and then prn 12 tablet 0  . EPINEPHRINE 0.3 MG/0.3ML IJ DEVI Intramuscular Inject 0.3 mLs (0.3 mg total) into the muscle as needed. 1 Device 0    BP 168/92  Pulse 74  Temp(Src) 97.5 F (36.4 C) (Oral)  Resp 20  SpO2 100%  Physical Exam  Nursing note and vitals reviewed. Constitutional: She is oriented to person, place, and time. She appears well-developed and well-nourished.  HENT:  Head: Normocephalic and atraumatic.  Eyes: EOM are normal.  Neck: Normal range of motion.  Cardiovascular: Normal rate, regular rhythm and normal heart sounds.   Pulmonary/Chest: Effort normal and breath sounds normal. No respiratory distress. She has no wheezes. She exhibits no tenderness.  Abdominal: Soft. There is no tenderness.  Musculoskeletal: Normal range of motion.       Right lower leg: She exhibits tenderness, swelling and edema.  Neurological: She is alert and oriented to person, place,  and time.  Skin: Skin is warm and dry.  Psychiatric: She has a normal mood and affect.    ED Course  Procedures (including critical care time)  Date: 05/21/2011  Rate: 73  Rhythm: normal sinus rhythm  QRS Axis: normal  Intervals: normal  ST/T Wave abnormalities: normal  Conduction Disutrbances:none  Narrative Interpretation:   Old EKG Reviewed: none available   Labs Reviewed  POCT I-STAT, CHEM 8 - Abnormal; Notable for the following:    Glucose, Bld 109 (*)    Calcium, Ion 1.43 (*)    All other components within normal limits    BASIC METABOLIC PANEL - Abnormal; Notable for the following:    Glucose, Bld 117 (*)    Calcium 10.6 (*)    GFR calc non Af Amer 70 (*)    GFR calc Af Amer 81 (*)    All other components within normal limits  D-DIMER, QUANTITATIVE  CBC   Dg Chest 2 View  05/20/2011  *RADIOLOGY REPORT*  Clinical Data: Shortness of breath, chest tightness  CHEST - 2 VIEW  Comparison: None.  Findings: Heart size is upper limits of normal.  Lungs are clear. No pleural effusion. Fine detail is obscured by patient body habitus.  No acute osseous finding.  IMPRESSION: Borderline cardiomegaly without focal acute cardiopulmonary process.  Original Report Authenticated By: Harrel Lemon, M.D.     1. Swelling of right lower extremity   2. Dyspnea       MDM  Patient presents with 4 days of right lower extremity swelling. She states it began without injury. The past couple days she is also noted some increased shortness of breath. She does describe tightness with her breathing. She's had no cough fever. No pleuritic pain. No cardiac or DVT/PE risk factors. On evaluation here the patient does have a tender right lower extremity below the knee. There is some fullness behind the knee. Question Bakers Cyst. There is no redness to her suggest cellulitis.  History the patient has no tachycardia or hypoxia. She is in no distress.   Labs are unremarkable included a negative d-dimer. Hemoglobin is stable. Questioned further she does report a lot of anxiety related to her symptoms of shortness of breath.   No cardiac risk factors and normal EKG, doubt ACS. No HLD, HTN, DM, early MI. Pt is an occasional smoker (not every day).  Chest x-ray was normal. Given the negative d-dimer we have low suspicion of DVT or PE. We'll however arrange an outpatient ultrasound. Do not feel the patient needs empiric anticoagulation at this point. Instructed to come to the vascular lab tomorrow for ultrasound.  Later review and nurses note  and further discussion with the patient indicated she has had some occasional rectal bleeding. She reports is intermittent for several weeks and describes it as spots of blood after wiping. She has had no symptoms of anemia hemoglobin is appropriate here. Feel like this is stable to be followed up as an outpatient.      Rebecca Poag, MD 05/21/11 [phone]

## 2011-05-20 NOTE — Discharge Instructions (Signed)
Come back tomorrow as directed on sheet for your ultrasound.

## 2011-05-20 NOTE — ED Notes (Signed)
Patient with blood in stool for three weeks.  Also having leg pain since Sunday, with shortness of breath.  Patient states she has not seen a PCP for blood in stool.

## 2011-05-21 ENCOUNTER — Ambulatory Visit (HOSPITAL_COMMUNITY)
Admission: RE | Admit: 2011-05-21 | Discharge: 2011-05-21 | Disposition: A | Discharge status: Home / Self Care | Payer: Self-pay | Source: Ambulatory Visit | Attending: Emergency Medicine | Admitting: Emergency Medicine

## 2011-05-21 ENCOUNTER — Emergency Department (HOSPITAL_COMMUNITY)
Admission: EM | Admit: 2011-05-21 | Discharge: 2011-05-21 | Disposition: A | Discharge status: Home / Self Care | Payer: Self-pay | Attending: Emergency Medicine | Admitting: Emergency Medicine

## 2011-05-21 ENCOUNTER — Encounter (HOSPITAL_COMMUNITY): Payer: Self-pay | Admitting: Emergency Medicine

## 2011-05-21 DIAGNOSIS — M79609 Pain in unspecified limb: Secondary | ICD-10-CM | POA: Insufficient documentation

## 2011-05-21 DIAGNOSIS — F17200 Nicotine dependence, unspecified, uncomplicated: Secondary | ICD-10-CM | POA: Insufficient documentation

## 2011-05-21 DIAGNOSIS — N289 Disorder of kidney and ureter, unspecified: Secondary | ICD-10-CM | POA: Insufficient documentation

## 2011-05-21 DIAGNOSIS — I10 Essential (primary) hypertension: Secondary | ICD-10-CM | POA: Insufficient documentation

## 2011-05-21 DIAGNOSIS — M7120 Synovial cyst of popliteal space [Baker], unspecified knee: Secondary | ICD-10-CM | POA: Insufficient documentation

## 2011-05-21 DIAGNOSIS — R0602 Shortness of breath: Secondary | ICD-10-CM | POA: Insufficient documentation

## 2011-05-21 DIAGNOSIS — M7989 Other specified soft tissue disorders: Secondary | ICD-10-CM

## 2011-05-21 DIAGNOSIS — M660 Rupture of popliteal cyst: Secondary | ICD-10-CM

## 2011-05-21 DIAGNOSIS — R0789 Other chest pain: Secondary | ICD-10-CM | POA: Insufficient documentation

## 2011-05-21 LAB — POCT I-STAT, CHEM 8
Calcium, Ion: 1.33 mmol/L — ABNORMAL HIGH (ref 1.12–1.32)
Glucose, Bld: 100 mg/dL — ABNORMAL HIGH (ref 70–99)
HCT: 41 % (ref 36.0–46.0)
TCO2: 25 mmol/L (ref 0–100)

## 2011-05-21 MED ORDER — OXYCODONE-ACETAMINOPHEN 5-325 MG PO TABS
1.0000 | ORAL_TABLET | Freq: Once | ORAL | Status: AC
  Administered 2011-05-21: 1 via ORAL
  Filled 2011-05-21: qty 1

## 2011-05-21 MED ORDER — OXYCODONE-ACETAMINOPHEN 5-325 MG PO TABS: 1.0000 | ORAL_TABLET | Freq: Four times a day (QID) | ORAL | Status: AC | PRN

## 2011-05-21 MED ORDER — HYDROCHLOROTHIAZIDE 25 MG PO TABS: 25.0000 mg | ORAL_TABLET | Freq: Every day | ORAL | Status: DC

## 2011-05-21 NOTE — ED Notes (Signed)
Knee sleeve has been applied.

## 2011-05-21 NOTE — ED Provider Notes (Signed)
History     CSN: [id number]  Arrival date & time 05/21/11  1052   First MD Initiated Contact with Patient 05/21/11 1121      Chief Complaint  Patient presents with  . Leg Swelling    sent here after sonogram    (Consider location/radiation/quality/duration/timing/severity/associated sxs/prior treatment) HPI Patient presents with complaint of pain in her right past and behind her right knee. She was seen in the emergency department last night and referred to have an outpatient ultrasound/DVT study this morning. The ultrasound report a Baker cyst that appears to be ruptured with associated fluid collection. She was referred back to the emergency department to 2 pain in her leg as well as elevated blood pressure. She states that she's been taking Tylenol for pain which helps somewhat but does not relieve the pain. She did feel a "pop" in her leg this morning as she was getting out of the car coming for the ultrasound. She has no chest pain or shortness of breath. She has no numbness or weakness of her foot or lower extremity. Palpation and movement make the pain worse. There no other associated systemic symptoms appear  Past Medical History  Diagnosis Date  . Hypertension   . Environmental allergies     Past Surgical History  Procedure Date  . Partial hysterectomy     History reviewed. No pertinent family history.  History  Substance Use Topics  . Smoking status: Current Everyday Smoker  . Smokeless tobacco: Not on file  . Alcohol Use: No    OB History    Grav Para Term Preterm Abortions TAB SAB Ect Mult Living                  Review of Systems ROS reviewed and all otherwise negative except for mentioned in HPI  Allergies  Aspirin and Ibuprofen  Home Medications   Current Outpatient Rx  Name Route Sig Dispense Refill  . ACETAMINOPHEN 500 MG PO TABS Oral Take 1,000 mg by mouth every 6 (six) hours as needed. For pain    . EPINEPHRINE 0.3 MG/0.3ML IJ DEVI  Intramuscular Inject 0.3 mg into the muscle as needed. For allergic reaction    . FAMOTIDINE 10 MG PO TABS Oral Take 10 mg by mouth 2 (two) times daily as needed. For acid reflux    . HYDROCHLOROTHIAZIDE 25 MG PO TABS Oral Take 1 tablet (25 mg total) by mouth daily. 30 tablet 0  . OXYCODONE-ACETAMINOPHEN 5-325 MG PO TABS Oral Take 1-2 tablets by mouth every 6 (six) hours as needed for pain. 15 tablet 0    BP 161/103  Pulse 73  Temp 97.6 F (36.4 C)  Resp 18  Wt 315 lb (142.883 kg)  SpO2 99% Vitals reviewed Physical Exam Physical Examination: General appearance - alert, well appearing, and in no distress Mental status - alert, oriented to person, place, and time Chest - clear to auscultation, no wheezes, rales or rhonchi, symmetric air entry Heart - normal rate, regular rhythm, normal S1, S2, no murmurs, rubs, clicks or gallops Neurological - alert, oriented, normal speech, sensation and strength distally intact in RLE Musculoskeletal - tenderness to palpation in right popliteal fossa and diffusely over posterior calf, no deformity, calf is more full compared to left calf- no signs of compartment syndrome,  Extremities - peripheral pulses normal, no pedal edema, no clubbing or cyanosis Skin - normal coloration and turgor, no rashes  ED Course  Procedures (including critical care time)  Labs Reviewed  POCT I-STAT, CHEM 8 - Abnormal; Notable for the following:    Glucose, Bld 100 (*)    Calcium, Ion 1.33 (*)    All other components within normal limits   Dg Chest 2 View  05/20/2011  *RADIOLOGY REPORT*  Clinical Data: Shortness of breath, chest tightness  CHEST - 2 VIEW  Comparison: None.  Findings: Heart size is upper limits of normal.  Lungs are clear. No pleural effusion. Fine detail is obscured by patient body habitus.  No acute osseous finding.  IMPRESSION: Borderline cardiomegaly without focal acute cardiopulmonary process.  Original Report Authenticated By: Harrel Lemon,  M.D.     1. Baker's cyst, ruptured   2. Hypertension   3. Renal insufficiency       MDM  Patient presents with pain in her right lower extremity. She had a Baker's cyst it appears to be ruptured on ultrasound diagnosis morning. There is no sign of DVT. Patient also noted to be hypertensive in the emergency department. An i-STAT reveals mild renal insufficiency. She states that she has been on antihypertensives in the past but has not been taking them do to their side effects. Patient was given pain control and, a knee sleeve was applied to aid with compression due to the swelling of her leg. She has no signs or symptoms of compartment syndrome or nerve entrapment at bedtime. She was given referral to orthopedics for followup. She was also advised to see primary care doctor regarding her hypertension and mild renal insufficiency. She was given a prescription for hydrochlorothiazide as well. Patient given strict return precautions and is agreeable with this plan.        Ethelda Chick, MD 05/21/11 1400

## 2011-05-21 NOTE — Discharge Instructions (Signed)
Return to the emergency department with any concerns including increased swelling of your leg, weakness of your foot or toes, numbness of the bottom of your foot or lower leg, chest pain, difficulty breathing, changes in vision or speech, or any other alarming symptoms.  It is very important that you arrange for followup for your Bakers cyst with orthopedics. He may need further evaluation of the knee joint.  It is also very important that you arrange for followup with a primary care doctor regarding her high blood pressure.

## 2011-05-21 NOTE — Progress Notes (Signed)
*  PRELIMINARY RESULTS* Vascular Ultrasound Right lower extremity venous duplex has been completed.   No evidence of left common femoral vein or right lower extremity deep vein thrombosis. Baker's cyst seen measuring 4.6 x 1.2 x 1.7cm. Separate, large multiseptated fluid collection measuring 14.4 x 2.5 x 6.5cm extending from anteromedial knee to proximal calf. This is suggestive of a possible Baker's cyst rupture.   Malachy Moan, RDMS, RDCS 05/21/2011, 10:42 AM

## 2011-05-21 NOTE — ED Notes (Signed)
Pt states she was seen last night and sent for a sonogram today and was sent here to ED due to increased swelling, and pain. Pt is extremely hypertensive at admission 178/110

## 2011-05-21 NOTE — Progress Notes (Signed)
Orthopedic Tech Progress Note Patient Details:  Rebecca Avila [birth year] [id number]  Other Ortho Devices Type of Ortho Device: Knee Sleeve Ortho Device Interventions: Application   Cammer, Mickie Bail 05/21/2011, 12:31 PM

## 2011-05-22 NOTE — ED Provider Notes (Signed)
  I performed a history and physical examination of Symphani Eckstrom and discussed her management with Dr.Phillips.  I agree with the history, physical, assessment, and plan of care, with the following exceptions: None  This generally well appearing F, who on my exam is in no distress, p/w LE swelling and pain.  On my exam the lesion is most c/w ruptured Baker's Cyst.  She denies appreciable CP/ dyspnea and clarifies that she is anxious about her LE pain.  Given the absence of abnormal VS, her lack of distress, and the negative dimer, she was d/c w AM [place] F/U  (chart review demonstrates that this study showed a likely ruptured Baker's Cyst.)  I was present for the following procedures: None Time Spent in Critical Care of the patient: None Time spent in discussions with the patient and family: 70  Neri Vieyra, Elvis Coil, MD 05/22/11 1546

## 2012-01-27 HISTORY — DX: Hypercalcemia: E83.52

## 2012-04-19 ENCOUNTER — Emergency Department (INDEPENDENT_AMBULATORY_CARE_PROVIDER_SITE_OTHER)
Admission: EM | Admit: 2012-04-19 | Discharge: 2012-04-19 | Disposition: A | Discharge status: Home / Self Care | Payer: 59 | Source: Home / Self Care | Attending: Family Medicine | Admitting: Family Medicine

## 2012-04-19 ENCOUNTER — Emergency Department (INDEPENDENT_AMBULATORY_CARE_PROVIDER_SITE_OTHER): Payer: 59

## 2012-04-19 ENCOUNTER — Encounter (HOSPITAL_COMMUNITY): Payer: Self-pay | Admitting: *Deleted

## 2012-04-19 DIAGNOSIS — J019 Acute sinusitis, unspecified: Secondary | ICD-10-CM

## 2012-04-19 DIAGNOSIS — Z720 Tobacco use: Secondary | ICD-10-CM

## 2012-04-19 DIAGNOSIS — J410 Simple chronic bronchitis: Secondary | ICD-10-CM

## 2012-04-19 DIAGNOSIS — J40 Bronchitis, not specified as acute or chronic: Secondary | ICD-10-CM

## 2012-04-19 MED ORDER — IPRATROPIUM BROMIDE 0.06 % NA SOLN: 2.0000 | Freq: Four times a day (QID) | NASAL | Status: DC

## 2012-04-19 MED ORDER — DOXYCYCLINE HYCLATE 100 MG PO CAPS: 100.0000 mg | ORAL_CAPSULE | Freq: Two times a day (BID) | ORAL | Status: DC

## 2012-04-19 MED ORDER — GUAIFENESIN-CODEINE 100-10 MG/5ML PO SYRP: 10.0000 mL | ORAL_SOLUTION | Freq: Four times a day (QID) | ORAL | Status: DC | PRN

## 2012-04-19 NOTE — ED Provider Notes (Signed)
History     CSN: [id number]  Arrival date & time 04/19/12  1143   First MD Initiated Contact with Patient 04/19/12 1158      Chief Complaint  Patient presents with  . Influenza    (Consider location/radiation/quality/duration/timing/severity/associated sxs/prior treatment) Patient is a 47 y.o. female presenting with flu symptoms. The history is provided by the patient and the spouse.  Influenza Presenting symptoms: cough, fatigue, fever, myalgias and rhinorrhea   Presenting symptoms: no diarrhea, no nausea, no shortness of breath, no sore throat and no vomiting   Severity:  Moderate Onset quality:  Sudden Duration:  2 days Progression:  Worsening Chronicity:  New Associated symptoms: decreased physical activity and nasal congestion   Risk factors comment:  Smoking   Past Medical History  Diagnosis Date  . Hypertension   . Environmental allergies     Past Surgical History  Procedure Laterality Date  . Partial hysterectomy      Family History  Problem Relation Age of Onset  . Family history unknown: Yes    History  Substance Use Topics  . Smoking status: Current Every Day Smoker  . Smokeless tobacco: Not on file  . Alcohol Use: No    OB History   Grav Para Term Preterm Abortions TAB SAB Ect Mult Living                  Review of Systems  Constitutional: Positive for fever and fatigue.  HENT: Positive for congestion and rhinorrhea. Negative for sore throat.   Respiratory: Positive for cough. Negative for shortness of breath.   Gastrointestinal: Negative.  Negative for nausea, vomiting and diarrhea.  Genitourinary: Negative.   Musculoskeletal: Positive for myalgias.    Allergies  Aspirin and Ibuprofen  Home Medications   Current Outpatient Rx  Name  Route  Sig  Dispense  Refill  . acetaminophen (TYLENOL) 500 MG tablet   Oral   Take 1,000 mg by mouth every 6 (six) hours as needed. For pain         . doxycycline (VIBRAMYCIN) 100 MG capsule  Oral   Take 1 capsule (100 mg total) by mouth 2 (two) times daily.   20 capsule   0   . EPINEPHrine (EPI-PEN) 0.3 mg/0.3 mL DEVI   Intramuscular   Inject 0.3 mg into the muscle as needed. For allergic reaction         . famotidine (PEPCID) 10 MG tablet   Oral   Take 10 mg by mouth 2 (two) times daily as needed. For acid reflux         . guaiFENesin-codeine (ROBITUSSIN AC) 100-10 MG/5ML syrup   Oral   Take 10 mLs by mouth 4 (four) times daily as needed for cough.   180 mL   0   . hydrochlorothiazide (HYDRODIURIL) 25 MG tablet   Oral   Take 1 tablet (25 mg total) by mouth daily.   30 tablet   0   . ipratropium (ATROVENT) 0.06 % nasal spray   Nasal   Place 2 sprays into the nose 4 (four) times daily.   15 mL   1     BP 189/122  Pulse 82  Temp(Src) 98.7 F (37.1 C) (Oral)  Resp 18  SpO2 96%  Physical Exam  Nursing note and vitals reviewed. Constitutional: She is oriented to person, place, and time. She appears well-developed and well-nourished.  HENT:  Right Ear: External ear normal.  Left Ear: External ear normal.  Nose: Mucosal  edema and rhinorrhea present.  Mouth/Throat: Oropharynx is clear and moist.  Eyes: Conjunctivae are normal. Pupils are equal, round, and reactive to light.  Neck: Normal range of motion. Neck supple.  Cardiovascular: Regular rhythm and normal heart sounds.   Pulmonary/Chest: No respiratory distress. She has decreased breath sounds. She has rhonchi.  Abdominal: Soft. Bowel sounds are normal.  Lymphadenopathy:    She has no cervical adenopathy.  Neurological: She is alert and oriented to person, place, and time.  Skin: Skin is warm and dry.    ED Course  Procedures (including critical care time)  Labs Reviewed - No data to display Dg Chest 2 View  04/19/2012  *RADIOLOGY REPORT*  Clinical Data: Cough and fever  CHEST - 2 VIEW  Comparison:  May 20, 2011  Findings: The lungs clear.  The heart size and pulmonary vascularity are  normal.  No adenopathy.  No bone lesions.  IMPRESSION: No abnormality noted.   Original Report Authenticated By: Bretta Bang, M.D.      1. Sinusitis, acute   2. Bronchitis due to tobacco use       MDM  X-rays reviewed and report per radiologist.         Linna Hoff, MD 04/19/12 [phone]

## 2012-04-19 NOTE — ED Notes (Signed)
Pt reports flu like symptoms since Sunday - body aches , congestion, productive green sputum, loss of voice

## 2013-02-03 ENCOUNTER — Emergency Department (HOSPITAL_COMMUNITY): Payer: 59

## 2013-02-03 ENCOUNTER — Encounter (HOSPITAL_COMMUNITY): Payer: Self-pay | Admitting: Emergency Medicine

## 2013-02-03 ENCOUNTER — Emergency Department (HOSPITAL_COMMUNITY)
Admission: EM | Admit: 2013-02-03 | Discharge: 2013-02-03 | Disposition: A | Discharge status: Home / Self Care | Payer: 59 | Attending: Emergency Medicine | Admitting: Emergency Medicine

## 2013-02-03 DIAGNOSIS — F17200 Nicotine dependence, unspecified, uncomplicated: Secondary | ICD-10-CM | POA: Insufficient documentation

## 2013-02-03 DIAGNOSIS — J111 Influenza due to unidentified influenza virus with other respiratory manifestations: Secondary | ICD-10-CM

## 2013-02-03 DIAGNOSIS — R69 Illness, unspecified: Secondary | ICD-10-CM

## 2013-02-03 DIAGNOSIS — Z79899 Other long term (current) drug therapy: Secondary | ICD-10-CM | POA: Insufficient documentation

## 2013-02-03 DIAGNOSIS — K625 Hemorrhage of anus and rectum: Secondary | ICD-10-CM

## 2013-02-03 DIAGNOSIS — I10 Essential (primary) hypertension: Secondary | ICD-10-CM

## 2013-02-03 MED ORDER — MORPHINE SULFATE 4 MG/ML IJ SOLN
4.0000 mg | Freq: Once | INTRAMUSCULAR | Status: AC
  Administered 2013-02-03: 4 mg via INTRAVENOUS
  Filled 2013-02-03: qty 1

## 2013-02-03 MED ORDER — HYDROCODONE-HOMATROPINE 5-1.5 MG/5ML PO SYRP: 5.0000 mL | ORAL_SOLUTION | Freq: Four times a day (QID) | ORAL | Status: DC | PRN

## 2013-02-03 MED ORDER — SODIUM CHLORIDE 0.9 % IV BOLUS (SEPSIS)
1000.0000 mL | INTRAVENOUS | Status: AC
  Administered 2013-02-03: 1000 mL via INTRAVENOUS

## 2013-02-03 MED ORDER — ALBUTEROL SULFATE HFA 108 (90 BASE) MCG/ACT IN AERS: 2.0000 | INHALATION_SPRAY | RESPIRATORY_TRACT | Status: DC | PRN

## 2013-02-03 MED ORDER — ALBUTEROL SULFATE (2.5 MG/3ML) 0.083% IN NEBU
5.0000 mg | INHALATION_SOLUTION | Freq: Once | RESPIRATORY_TRACT | Status: AC
  Administered 2013-02-03: 5 mg via RESPIRATORY_TRACT
  Filled 2013-02-03: qty 6

## 2013-02-03 NOTE — ED Notes (Signed)
Pt reports chest and nasal congestion, productive cough with dark yellow phlegm, and sinus pressure x4 days. Pt reports taking OTC decongestant with out any relief

## 2013-02-03 NOTE — ED Notes (Signed)
PA at bedside.

## 2013-02-03 NOTE — ED Provider Notes (Signed)
CSN: [id number]     Arrival date & time 02/03/13  1321 History   First MD Initiated Contact with Patient 02/03/13 1656     Chief Complaint  Patient presents with  . Nasal Congestion  . Cough   (Consider location/radiation/quality/duration/timing/severity/associated sxs/prior Treatment) Patient is a 48 y.o. female presenting with cough. The history is provided by the patient. No language interpreter was used.  Cough Cough characteristics:  Productive and paroxysmal Sputum characteristics:  Nondescript Severity:  Severe Duration:  4 days Timing:  Intermittent Progression:  Unchanged Chronicity:  New Smoker: no   Context: upper respiratory infection   Relieved by:  Nothing Worsened by:  Activity (heat) Associated symptoms: chills, headaches and sinus congestion   Associated symptoms: no chest pain, no diaphoresis, no ear fullness, no ear pain, no eye discharge, no fever, no myalgias, no rash, no rhinorrhea, no sore throat, no weight loss and no wheezing     Past Medical History  Diagnosis Date  . Hypertension   . Environmental allergies    Past Surgical History  Procedure Laterality Date  . Partial hysterectomy     History reviewed. No pertinent family history. History  Substance Use Topics  . Smoking status: Current Every Day Smoker  . Smokeless tobacco: Not on file  . Alcohol Use: No   OB History   Grav Para Term Preterm Abortions TAB SAB Ect Mult Living                 Review of Systems  Constitutional: Positive for chills. Negative for fever, weight loss and diaphoresis.  HENT: Negative for ear pain, rhinorrhea and sore throat.   Eyes: Negative for discharge.  Respiratory: Positive for cough. Negative for wheezing.   Cardiovascular: Negative for chest pain.  Musculoskeletal: Negative for myalgias.  Skin: Negative for rash.  Neurological: Positive for headaches.    Allergies  Aspirin and Ibuprofen  Home Medications   Current Outpatient Rx  Name  Route   Sig  Dispense  Refill  . acetaminophen (TYLENOL) 500 MG tablet   Oral   Take 1,000 mg by mouth every 6 (six) hours as needed. For pain         . EPINEPHrine (EPI-PEN) 0.3 mg/0.3 mL DEVI   Intramuscular   Inject 0.3 mg into the muscle as needed. For allergic reaction         . famotidine (PEPCID) 10 MG tablet   Oral   Take 10 mg by mouth 2 (two) times daily as needed. For acid reflux         . ipratropium (ATROVENT) 0.06 % nasal spray   Nasal   Place 2 sprays into the nose 4 (four) times daily.   15 mL   1   . OVER THE COUNTER MEDICATION   Oral   Take 2 tablets by mouth daily as needed (congestion). [facility] brand decongestant         . EXPIRED: hydrochlorothiazide (HYDRODIURIL) 25 MG tablet   Oral   Take 1 tablet (25 mg total) by mouth daily.   30 tablet   0    BP 162/83  Pulse 90  Temp(Src) 98.3 F (36.8 C) (Oral)  Resp 24  Ht 5\' 11"  (1.803 m)  Wt 358 lb 4.8 oz (162.524 kg)  BMI 49.99 kg/m2  SpO2 95% Physical Exam Appears moderately ill but not toxic; temperature as noted in vitals. Ears normal. Eyes:glassy appearance, no discharge  Heart: RRR, NO M/G/R Throat and pharynx normal.   Neck  supple. No adenopathyhy in the neck.  Sinuses non tender.  The chest is clear. Bronchitic, tight cough. Abdomen is soft and nontender Digital Rectal Exam reveals sphincter with good tone. Non-thrombosed external hemorrhoids. No masses or fissures. Stool color is brown with no overt blood.  ED Course  Procedures (including critical care time) Labs Review Labs Reviewed - No data to display Imaging Review Dg Chest 2 View  02/03/2013   CLINICAL DATA:  Shortness of breath  EXAM: CHEST  2 VIEW  COMPARISON:  April 19, 2012  FINDINGS: The heart size and mediastinal contours are within normal limits. Both lungs are clear. There is no focal infiltrate, pulmonary edema, or pleural effusion. The visualized skeletal structures are unremarkable.  IMPRESSION: No active  cardiopulmonary disease.   Electronically Signed   By: Sherian Rein M.D.   On: 02/03/2013 14:56    EKG Interpretation   None       MDM   1. Influenza-like illness   2. Rectal bleeding   3. Hypertension    7:37 PM BP 177/85  Pulse 73  Temp(Src) 98.3 F (36.8 C) (Oral)  Resp 24  Ht 5\' 11"  (1.803 m)  Wt 358 lb 4.8 oz (162.524 kg)  BMI 49.99 kg/m2  SpO2 93% Patient feeling better after fluids and morphine. No blood on dre. Palpable internal hemorrohoid, Will dc with gi fu and pcp referral.    , PA-C 02/03/13 2003

## 2013-02-03 NOTE — ED Notes (Signed)
Pt reports congestion, body aches, cough and runny nose x 4 days. States "this usually goes away but now it is sticking around."

## 2013-02-03 NOTE — Discharge Instructions (Signed)
Read the instructions below on reasons to return to the emergency department and to learn more about your diagnosis.  Use over the counter medications for symptomatic relief (mucinex as a decongestant, Tylenol for fever/pain, Motrin/Ibuprofen for muscle aches). If prescribed a cough suppressant during your visit, do not operate heavy machinery with in 5 hours of taking this medication. Followup with your primary care doctor in 4 days if your symptoms persist.  Your more than welcome to return to the emergency department if symptoms worsen or become concerning.  Upper Respiratory Infection, Adult  An upper respiratory infection (URI) is also sometimes known as the common cold. Most people improve within 1 week, but symptoms can last up to 2 weeks. A residual cough may last even longer.   URI is most commonly caused by a virus. Viruses are NOT treated with antibiotics. You can easily spread the virus to others by oral contact. This includes kissing, sharing a glass, coughing, or sneezing. Touching your mouth or nose and then touching a surface, which is then touched by another person, can also spread the virus.   TREATMENT  Treatment is directed at relieving symptoms. There is no cure. Antibiotics are not effective, because the infection is caused by a virus, not by bacteria. Treatment may include:  Increased fluid intake. Sports drinks offer valuable electrolytes, sugars, and fluids.  Breathing heated mist or steam (vaporizer or shower).  Eating chicken soup or other clear broths, and maintaining good nutrition.  Getting plenty of rest.  Using gargles or lozenges for comfort.  Controlling fevers with ibuprofen or acetaminophen as directed by your caregiver.  Increasing usage of your inhaler if you have asthma.  Return to work when your temperature has returned to normal.   SEEK MEDICAL CARE IF:  After the first few days, you feel you are getting worse rather than better.  You develop worsening  shortness of breath, or brown or red sputum. These may be signs of pneumonia.  You develop yellow or brown nasal discharge or pain in the face, especially when you bend forward. These may be signs of sinusitis.  You develop a fever, swollen neck glands, pain with swallowing, or white areas in the back of your throat. These may be signs of strep throat.   Influenza, Adult Influenza ("the flu") is a viral infection of the respiratory tract. It occurs more often in winter months because people spend more time in close contact with one another. Influenza can make you feel very sick. Influenza easily spreads from person to person (contagious). CAUSES  Influenza is caused by a virus that infects the respiratory tract. You can catch the virus by breathing in droplets from an infected person's cough or sneeze. You can also catch the virus by touching something that was recently contaminated with the virus and then touching your mouth, nose, or eyes. SYMPTOMS  Symptoms typically last 4 to 10 days and may include:  Fever.  Chills.  Headache, body aches, and muscle aches.  Sore throat.  Chest discomfort and cough.  Poor appetite.  Weakness or feeling tired.  Dizziness.  Nausea or vomiting. DIAGNOSIS  Diagnosis of influenza is often made based on your history and a physical exam. A nose or throat swab test can be done to confirm the diagnosis. RISKS AND COMPLICATIONS You may be at risk for a more severe case of influenza if you smoke cigarettes, have diabetes, have chronic heart disease (such as heart failure) or lung disease (such as asthma), or  if you have a weakened immune system. Elderly people and pregnant women are also at risk for more serious infections. The most common complication of influenza is a lung infection (pneumonia). Sometimes, this complication can require emergency medical care and may be life-threatening. PREVENTION  An annual influenza vaccination (flu shot) is the best  way to avoid getting influenza. An annual flu shot is now routinely recommended for all adults in the [place] TREATMENT  In mild cases, influenza goes away on its own. Treatment is directed at relieving symptoms. For more severe cases, your caregiver may prescribe antiviral medicines to shorten the sickness. Antibiotic medicines are not effective, because the infection is caused by a virus, not by bacteria. HOME CARE INSTRUCTIONS  Only take over-the-counter or prescription medicines for pain, discomfort, or fever as directed by your caregiver.  Use a cool mist humidifier to make breathing easier.  Get plenty of rest until your temperature returns to normal. This usually takes 3 to 4 days.  Drink enough fluids to keep your urine clear or pale yellow.  Cover your mouth and nose when coughing or sneezing, and wash your hands well to avoid spreading the virus.  Stay home from work or school until your fever has been gone for at least 1 full day. SEEK MEDICAL CARE IF:   You have chest pain or a deep cough that worsens or produces more mucus.  You have nausea, vomiting, or diarrhea. SEEK IMMEDIATE MEDICAL CARE IF:   You have difficulty breathing, shortness of breath, or your skin or nails turn bluish.  You have severe neck pain or stiffness.  You have a severe headache, facial pain, or earache.  You have a worsening or recurring fever.  You have nausea or vomiting that cannot be controlled. MAKE SURE YOU:  Understand these instructions.  Will watch your condition.  Will get help right away if you are not doing well or get worse. Document Released: 01/10/2000 Document Revised: 07/14/2011 Document Reviewed: 04/13/2011 [facility] Patient Information 2014 [place], [place].

## 2013-02-03 NOTE — ED Notes (Signed)
Pt now reports streaks bloody stool since Tuesday, pt reports she has been coughing and feels this is from straining to cough.

## 2013-02-04 NOTE — ED Provider Notes (Signed)
History/physical exam/procedure(s) were performed by non-physician practitioner and as supervising physician I was immediately available for consultation/collaboration. I have reviewed all notes and am in agreement with care and plan.   Hilario Quarry, MD 02/04/13 [phone]

## 2013-04-26 IMAGING — CR DG CHEST 2V
2 series · 2 of 2 positions shown · non-contrast
Comparison: May 20, 2011

CLINICAL DATA: Cough and fever

CHEST - 2 VIEW

[view not recorded (1 of 2)]
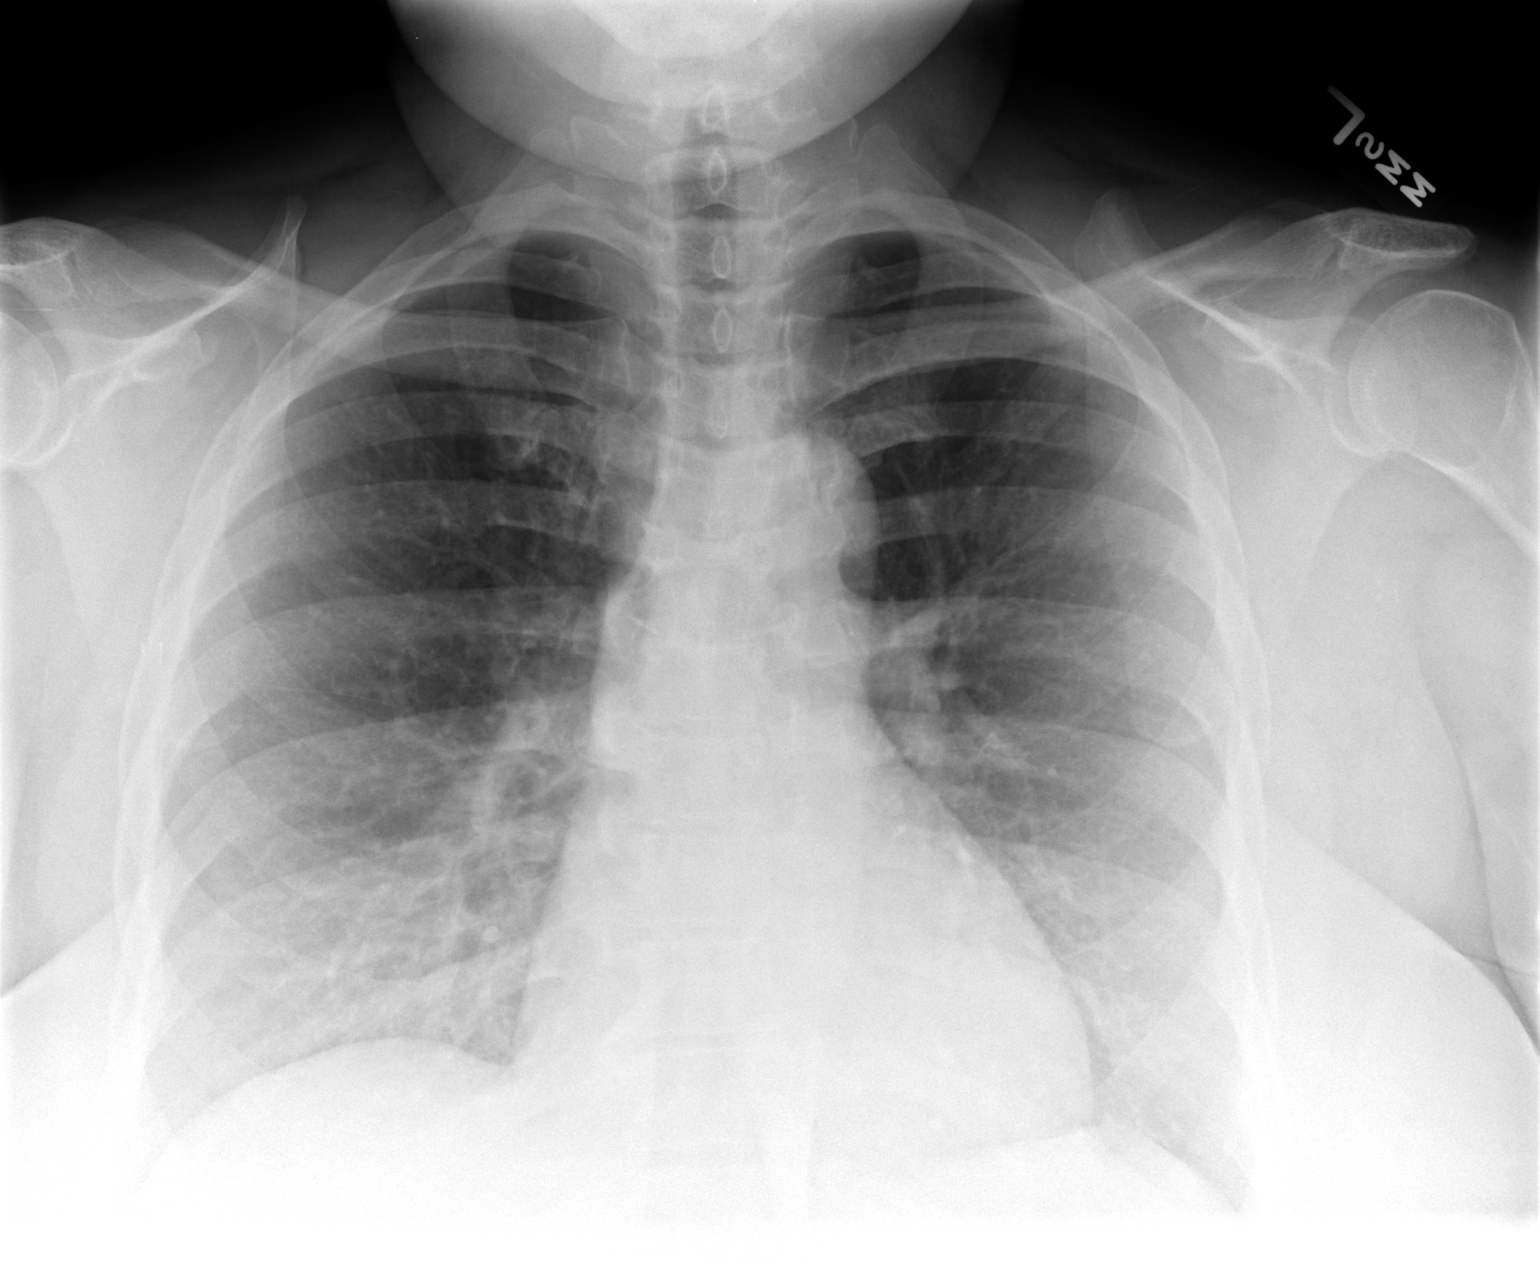

[view not recorded (2 of 2)]
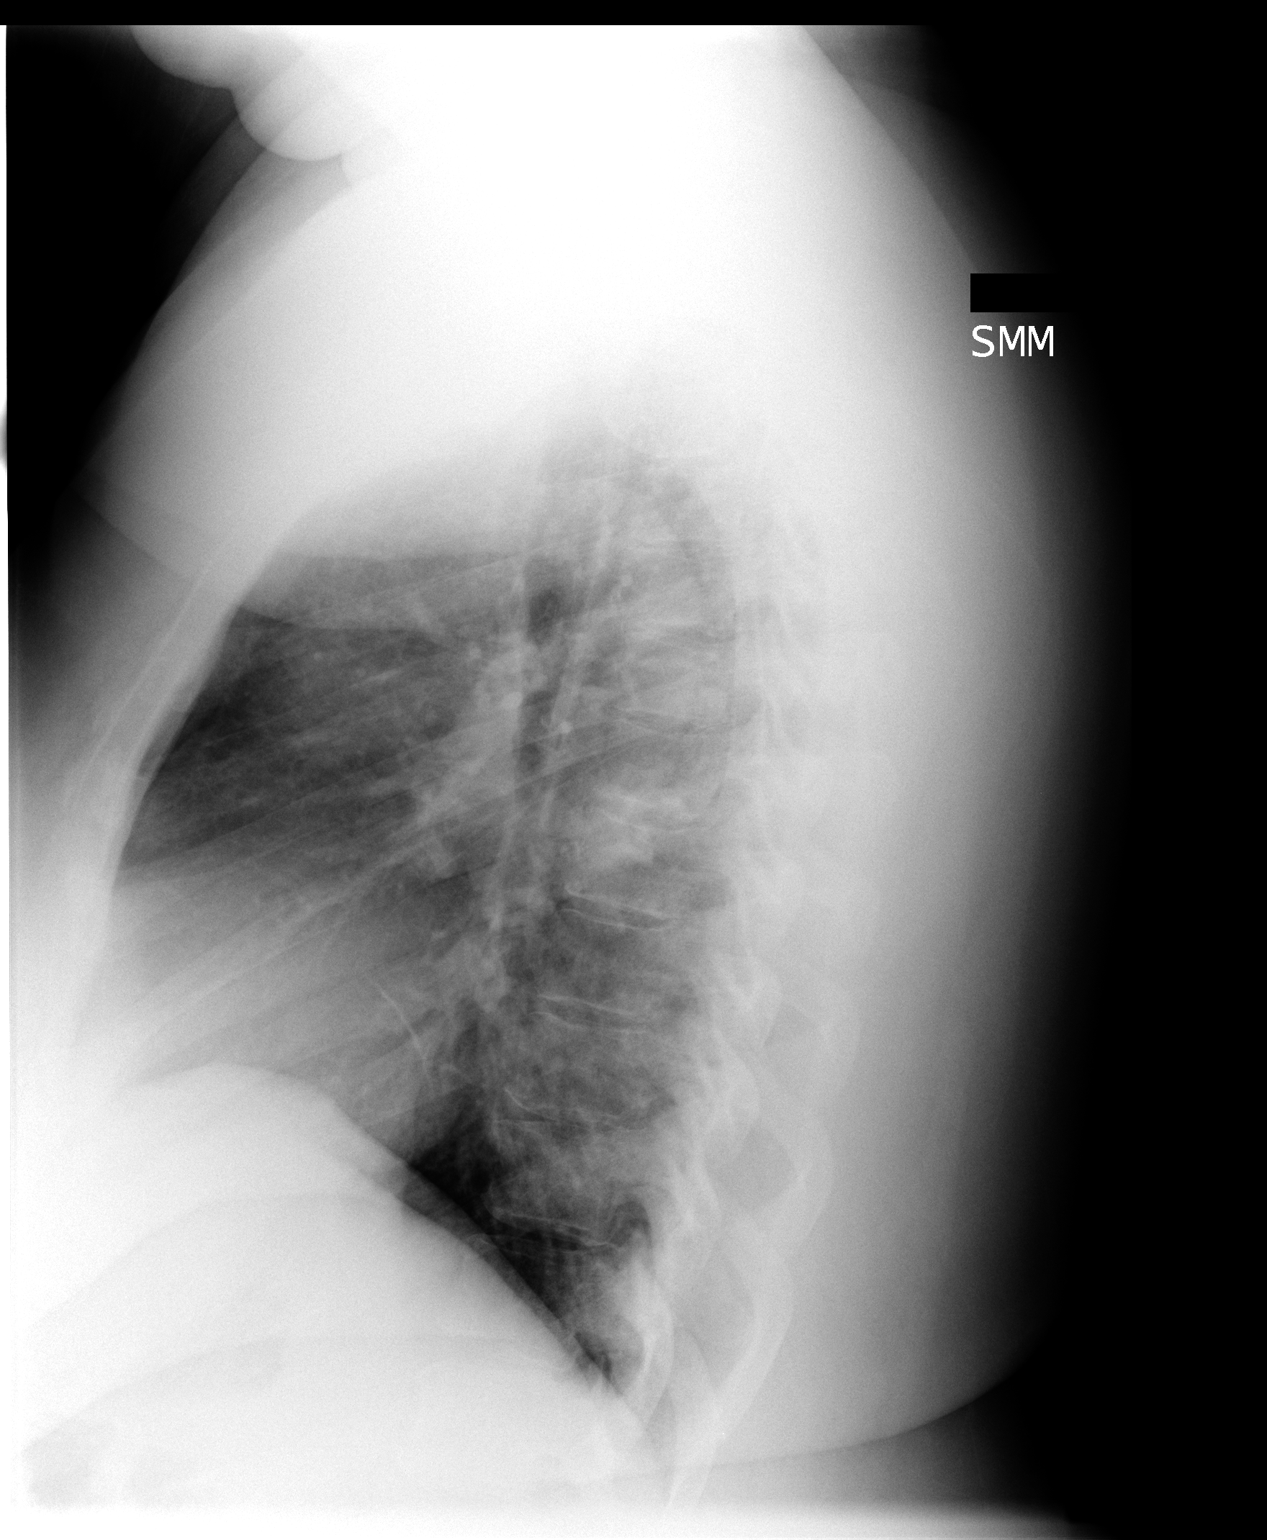

[2 of 2 positions shown; findings below may reference images not displayed]

FINDINGS: The lungs clear.  The heart size and pulmonary
vascularity are normal.  No adenopathy.  No bone lesions.
IMPRESSION: No abnormality noted.

## 2013-09-10 ENCOUNTER — Emergency Department (HOSPITAL_COMMUNITY)
Admission: EM | Admit: 2013-09-10 | Discharge: 2013-09-10 | Disposition: A | Discharge status: Home / Self Care | Payer: 59 | Attending: Emergency Medicine | Admitting: Emergency Medicine

## 2013-09-10 ENCOUNTER — Encounter (HOSPITAL_COMMUNITY): Payer: Self-pay | Admitting: Emergency Medicine

## 2013-09-10 DIAGNOSIS — I10 Essential (primary) hypertension: Secondary | ICD-10-CM | POA: Insufficient documentation

## 2013-09-10 DIAGNOSIS — F17200 Nicotine dependence, unspecified, uncomplicated: Secondary | ICD-10-CM | POA: Insufficient documentation

## 2013-09-10 DIAGNOSIS — Z79899 Other long term (current) drug therapy: Secondary | ICD-10-CM | POA: Insufficient documentation

## 2013-09-10 DIAGNOSIS — M545 Low back pain, unspecified: Secondary | ICD-10-CM | POA: Insufficient documentation

## 2013-09-10 MED ORDER — HYDROCODONE-ACETAMINOPHEN 5-325 MG PO TABS: 1.0000 | ORAL_TABLET | Freq: Four times a day (QID) | ORAL | Status: DC | PRN

## 2013-09-10 MED ORDER — CYCLOBENZAPRINE HCL 5 MG PO TABS: 10.0000 mg | ORAL_TABLET | Freq: Two times a day (BID) | ORAL | Status: DC | PRN

## 2013-09-10 NOTE — ED Provider Notes (Signed)
Medical screening examination/treatment/procedure(s) were performed by non-physician practitioner and as supervising physician I was immediately available for consultation/collaboration.   EKG Interpretation None        Aarilyn Dye N Tydarius Yawn, DO 09/10/13 1507 

## 2013-09-10 NOTE — ED Notes (Signed)
Pt c/o lower back pain that is now radiating down her left leg for about a wee now.

## 2013-09-10 NOTE — ED Provider Notes (Signed)
CSN: [id number]     Arrival date & time 09/10/13  1320 History  This chart was scribed for non-physician practitioner, Teressa Lower, NP working with Layla Maw Ward, DO by Greggory Stallion, ED scribe. This patient was seen in room WTR6/WTR6 and the patient's care was started at 1:50 PM.   Chief Complaint  Patient presents with  . Back Pain   The history is provided by the patient. No language interpreter was used.   HPI Comments: Rebecca Avila is a 48 y.o. female who presents to the Emergency Department complaining of gradual onset bilateral lower back pain that intermittently radiates into her legs that started one week ago. Pain worsened 2 days ago while she was working. 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 Reports history of back spasms and a slipped disc but denies other history of back problems. She states she was seeing a chiropractor for the slipped disc but has not seen them in a few years. Pt has taken tylenol with some relief. States she had one left over percocet pill that she has taken with some relief as well. Denies difficulty urinating, bowel or bladder incontinence, numbness or tingling. Pt does not have a PCP.   Past Medical History  Diagnosis Date  . Hypertension   . Environmental allergies    Past Surgical History  Procedure Laterality Date  . Partial hysterectomy     No family history on file. History  Substance Use Topics  . Smoking status: Current Every Day Smoker  . Smokeless tobacco: Not on file  . Alcohol Use: No   OB History   Grav Para Term Preterm Abortions TAB SAB Ect Mult Living                 Review of Systems  Genitourinary: Negative for difficulty urinating.       Negative for bowel or bladder incontinence.  Musculoskeletal: Positive for back pain and myalgias.  Neurological: Negative for numbness.  All other systems reviewed and are negative.  Allergies  Aspirin and Ibuprofen  Home Medications   Prior to Admission medications   Medication Sig Start  Date End Date Taking? Authorizing Provider  acetaminophen (TYLENOL) 500 MG tablet Take 1,000 mg by mouth every 6 (six) hours as needed. For pain    Historical Provider, MD  albuterol (PROVENTIL HFA;VENTOLIN HFA) 108 (90 BASE) MCG/ACT inhaler Inhale 2 puffs into the lungs every 4 (four) hours as needed for wheezing or shortness of breath. 02/03/13   Arthor Captain, PA-C  EPINEPHrine (EPI-PEN) 0.3 mg/0.3 mL DEVI Inject 0.3 mg into the muscle as needed. For allergic reaction 02/05/11   Arie Sabina Schinlever, PA-C  famotidine (PEPCID) 10 MG tablet Take 10 mg by mouth 2 (two) times daily as needed. For acid reflux    Historical Provider, MD  hydrochlorothiazide (HYDRODIURIL) 25 MG tablet Take 1 tablet (25 mg total) by mouth daily. 05/21/11 05/20/12  Ethelda Chick, MD  HYDROcodone-homatropine (HYCODAN) 5-1.5 MG/5ML syrup Take 5 mLs by mouth every 6 (six) hours as needed for cough. 02/03/13   Arthor Captain, PA-C  ipratropium (ATROVENT) 0.06 % nasal spray Place 2 sprays into the nose 4 (four) times daily. 04/19/12   Linna Hoff, MD  OVER THE COUNTER MEDICATION Take 2 tablets by mouth daily as needed (congestion). [facility] brand decongestant    Historical Provider, MD   BP 150/88  Pulse 92  Temp(Src) 98.7 F (37.1 C) (Oral)  Resp 20  SpO2 98%  Physical Exam  Nursing note and  vitals reviewed. Constitutional: She is oriented to person, place, and time. She appears well-developed and well-nourished. No distress.  HENT:  Head: Normocephalic and atraumatic.  Eyes: Conjunctivae and EOM are normal.  Neck: Neck supple. No tracheal deviation present.  Cardiovascular: Normal rate.   Pulmonary/Chest: Effort normal. No respiratory distress.  Musculoskeletal: Normal range of motion.  Generalized spinal and paraspinal tenderness. Moving all extremities without any problems.   Neurological: She is alert and oriented to person, place, and time. She exhibits normal muscle tone. Coordination normal.  Skin: Skin is  warm and dry.  Psychiatric: She has a normal mood and affect. Her behavior is normal.    ED Course  Procedures (including critical care time)  DIAGNOSTIC STUDIES: Oxygen Saturation is 98% on RA, normal by my interpretation.    COORDINATION OF CARE: 1:53 PM-Advised pt xray is not necessary today and she agrees. Discussed treatment plan which includes pain medication and a muscle relaxer with pt at bedside and pt agreed to plan. Will give pt orthopedic referral and advised her to follow up.   Labs Review Labs Reviewed - No data to display  Imaging Review No results found.   EKG Interpretation None      MDM   Final diagnoses:  Midline low back pain without sciatica    Pt has no red flags. Pt is neurovascularly intact. Will treat symptomatically and have pt follow up with Dr. Pearletha Forge as needed  I personally performed the services described in this documentation, which was scribed in my presence. The recorded information has been reviewed and is accurate.  Teressa Lower, NP 09/10/13 1406

## 2013-09-10 NOTE — Discharge Instructions (Signed)
Back Pain, Adult °Back pain is very common. The pain often gets better over time. The cause of back pain is usually not dangerous. Most people can learn to manage their back pain on their own.  °HOME CARE  °· Stay active. Start with short walks on flat ground if you can. Try to walk farther each day. °· Do not sit, drive, or stand in one place for more than 30 minutes. Do not stay in bed. °· Do not avoid exercise or work. Activity can help your back heal faster. °· Be careful when you bend or lift an object. Bend at your knees, keep the object close to you, and do not twist. °· Sleep on a firm mattress. Lie on your side, and bend your knees. If you lie on your back, put a pillow under your knees. °· Only take medicines as told by your doctor. °· Put ice on the injured area. °¨ Put ice in a plastic bag. °¨ Place a towel between your skin and the bag. °¨ Leave the ice on for 15-20 minutes, 03-04 times a day for the first 2 to 3 days. After that, you can switch between ice and heat packs. °· Ask your doctor about back exercises or massage. °· Avoid feeling anxious or stressed. Find good ways to deal with stress, such as exercise. °GET HELP RIGHT AWAY IF:  °· Your pain does not go away with rest or medicine. °· Your pain does not go away in 1 week. °· You have new problems. °· You do not feel well. °· The pain spreads into your legs. °· You cannot control when you poop (bowel movement) or pee (urinate). °· Your arms or legs feel weak or lose feeling (numbness). °· You feel sick to your stomach (nauseous) or throw up (vomit). °· You have belly (abdominal) pain. °· You feel like you may pass out (faint). °MAKE SURE YOU:  °· Understand these instructions. °· Will watch your condition. °· Will get help right away if you are not doing well or get worse. °Document Released: 07/01/2007 Document Revised: 04/06/2011 Document Reviewed: 05/16/2013 °[employer]® Patient Information ©2015 [employer]. This information is not intended  to replace advice given to you by your health care provider. Make sure you discuss any questions you have with your health care provider. ° °

## 2013-09-19 ENCOUNTER — Emergency Department (HOSPITAL_COMMUNITY)
Admission: EM | Admit: 2013-09-19 | Discharge: 2013-09-19 | Disposition: A | Discharge status: Home / Self Care | Payer: 59 | Attending: Emergency Medicine | Admitting: Emergency Medicine

## 2013-09-19 ENCOUNTER — Encounter (HOSPITAL_COMMUNITY): Payer: Self-pay | Admitting: Emergency Medicine

## 2013-09-19 DIAGNOSIS — Z79899 Other long term (current) drug therapy: Secondary | ICD-10-CM | POA: Insufficient documentation

## 2013-09-19 DIAGNOSIS — M5430 Sciatica, unspecified side: Secondary | ICD-10-CM | POA: Insufficient documentation

## 2013-09-19 DIAGNOSIS — M549 Dorsalgia, unspecified: Secondary | ICD-10-CM | POA: Insufficient documentation

## 2013-09-19 DIAGNOSIS — I10 Essential (primary) hypertension: Secondary | ICD-10-CM | POA: Insufficient documentation

## 2013-09-19 DIAGNOSIS — M5431 Sciatica, right side: Secondary | ICD-10-CM

## 2013-09-19 DIAGNOSIS — F17200 Nicotine dependence, unspecified, uncomplicated: Secondary | ICD-10-CM | POA: Insufficient documentation

## 2013-09-19 MED ORDER — DEXAMETHASONE SODIUM PHOSPHATE 10 MG/ML IJ SOLN
10.0000 mg | Freq: Once | INTRAMUSCULAR | Status: AC
  Administered 2013-09-19: 10 mg via INTRAMUSCULAR
  Filled 2013-09-19: qty 1

## 2013-09-19 MED ORDER — CYCLOBENZAPRINE HCL 10 MG PO TABS: 10.0000 mg | ORAL_TABLET | Freq: Three times a day (TID) | ORAL | Status: DC | PRN

## 2013-09-19 MED ORDER — OXYCODONE-ACETAMINOPHEN 5-325 MG PO TABS: 1.0000 | ORAL_TABLET | Freq: Four times a day (QID) | ORAL | Status: DC | PRN

## 2013-09-19 MED ORDER — PREDNISONE 50 MG PO TABS: 50.0000 mg | ORAL_TABLET | Freq: Every day | ORAL | Status: DC

## 2013-09-19 NOTE — ED Provider Notes (Signed)
CSN: [id number]     Arrival date & time 09/19/13  1801 History   None    This chart was scribed for non-physician practitioner, Ebbie Ridge PA-C working with Mirian Mo, MD by Arlan Organ, ED Scribe. This patient was seen in room WTR7/WTR7 and the patient's care was started at 6:15 PM.   Chief Complaint  Patient presents with  . Back Pain   The history is provided by the patient. No language interpreter was used.    HPI Comments: Rebecca Avila is a 48 y.o. female with a PMHx of HTN who presents to the Emergency Department complaining of constant, moderate, back pain x 1 week that is unchanged. States back pain intermittently shoots down her legs bilaterally. Pain is exacerbated with certain movements. No alleviating factors at this time. She has tried OTC medications with mild temporary improvement for symptoms. At this time she denies any fever or chills. No numbness, weakness, or loss of sensation. No known allergies to medications. No other concerns his visit.   Past Medical History  Diagnosis Date  . Hypertension   . Environmental allergies    Past Surgical History  Procedure Laterality Date  . Partial hysterectomy     No family history on file. History  Substance Use Topics  . Smoking status: Current Every Day Smoker  . Smokeless tobacco: Not on file  . Alcohol Use: No   OB History   Grav Para Term Preterm Abortions TAB SAB Ect Mult Living                 Review of Systems  Constitutional: Positive for chills. Negative for fever.  Musculoskeletal: Positive for back pain.  Neurological: Negative for weakness and numbness.      Allergies  Aspirin and Ibuprofen  Home Medications   Prior to Admission medications   Medication Sig Start Date End Date Taking? Authorizing Provider  acetaminophen (TYLENOL) 500 MG tablet Take 1,000 mg by mouth every 6 (six) hours as needed. For pain    Historical Provider, MD  albuterol (PROVENTIL HFA;VENTOLIN HFA) 108 (90 BASE)  MCG/ACT inhaler Inhale 2 puffs into the lungs every 4 (four) hours as needed for wheezing or shortness of breath. 02/03/13   Arthor Captain, PA-C  cyclobenzaprine (FLEXERIL) 5 MG tablet Take 2 tablets (10 mg total) by mouth 2 (two) times daily as needed for muscle spasms. 09/10/13   Teressa Lower, NP  EPINEPHrine (EPI-PEN) 0.3 mg/0.3 mL DEVI Inject 0.3 mg into the muscle as needed. For allergic reaction 02/05/11   Arie Sabina Schinlever, PA-C  famotidine (PEPCID) 10 MG tablet Take 10 mg by mouth 2 (two) times daily as needed. For acid reflux    Historical Provider, MD  hydrochlorothiazide (HYDRODIURIL) 25 MG tablet Take 1 tablet (25 mg total) by mouth daily. 05/21/11 05/20/12  Ethelda Chick, MD  HYDROcodone-acetaminophen (NORCO/VICODIN) 5-325 MG per tablet Take 1-2 tablets by mouth every 6 (six) hours as needed. 09/10/13   Teressa Lower, NP  HYDROcodone-homatropine (HYCODAN) 5-1.5 MG/5ML syrup Take 5 mLs by mouth every 6 (six) hours as needed for cough. 02/03/13   Arthor Captain, PA-C  ipratropium (ATROVENT) 0.06 % nasal spray Place 2 sprays into the nose 4 (four) times daily. 04/19/12   Linna Hoff, MD  OVER THE COUNTER MEDICATION Take 2 tablets by mouth daily as needed (congestion). [facility] brand decongestant    Historical Provider, MD   Triage Vitals: BP 183/101  Pulse 89  Temp(Src) 98.2 F (36.8 C) (Oral)  Resp 19  SpO2 98%   Physical Exam  Nursing note and vitals reviewed. Constitutional: She is oriented to person, place, and time. She appears well-developed and well-nourished.  HENT:  Head: Normocephalic.  Eyes: EOM are normal.  Neck: Normal range of motion.  Pulmonary/Chest: Effort normal.  Musculoskeletal: Normal range of motion.       Back:  Neurological: She is alert and oriented to person, place, and time. She has normal strength and normal reflexes. No sensory deficit. She exhibits normal muscle tone. Coordination and gait normal.  Psychiatric: She has a normal mood and  affect.    ED Course  Procedures (including critical care time)  DIAGNOSTIC STUDIES: Oxygen Saturation is 98% on RA, Normal by my interpretation.    COORDINATION OF CARE: 6:15 PM- Will give dose of Decadron. Discussed treatment plan with pt at bedside and pt agreed to plan.     The patient has sciatica that will need more aggressive therapy and follow up.   I personally performed the services described in this documentation, which was scribed in my presence. The recorded information has been reviewed and is accurate.    Carlyle Dolly, PA-C 09/19/13 1909

## 2013-09-19 NOTE — Discharge Instructions (Signed)
Return here as needed.  Followup with Dr. provided.  Use ice and heat on your lower back

## 2013-09-19 NOTE — ED Notes (Signed)
Pt c/o back pain that radiates down both her legs. Pt states that she was seen here last week and then pain meds help for little bit but then the pain returns.

## 2013-09-23 NOTE — ED Provider Notes (Signed)
Medical screening examination/treatment/procedure(s) were performed by non-physician practitioner and as supervising physician I was immediately available for consultation/collaboration.   EKG Interpretation None        Matthew Gentry, MD 09/23/13 0842 

## 2013-10-11 ENCOUNTER — Encounter (HOSPITAL_COMMUNITY): Payer: Self-pay | Admitting: Emergency Medicine

## 2013-10-11 ENCOUNTER — Emergency Department (HOSPITAL_COMMUNITY)
Admission: EM | Admit: 2013-10-11 | Discharge: 2013-10-11 | Disposition: A | Discharge status: Home / Self Care | Payer: 59 | Attending: Emergency Medicine | Admitting: Emergency Medicine

## 2013-10-11 DIAGNOSIS — I10 Essential (primary) hypertension: Secondary | ICD-10-CM | POA: Insufficient documentation

## 2013-10-11 DIAGNOSIS — M545 Low back pain, unspecified: Secondary | ICD-10-CM | POA: Insufficient documentation

## 2013-10-11 DIAGNOSIS — Z79899 Other long term (current) drug therapy: Secondary | ICD-10-CM | POA: Insufficient documentation

## 2013-10-11 DIAGNOSIS — R7309 Other abnormal glucose: Secondary | ICD-10-CM | POA: Insufficient documentation

## 2013-10-11 DIAGNOSIS — R739 Hyperglycemia, unspecified: Secondary | ICD-10-CM

## 2013-10-11 DIAGNOSIS — IMO0002 Reserved for concepts with insufficient information to code with codable children: Secondary | ICD-10-CM | POA: Insufficient documentation

## 2013-10-11 DIAGNOSIS — M5430 Sciatica, unspecified side: Secondary | ICD-10-CM | POA: Insufficient documentation

## 2013-10-11 DIAGNOSIS — M5441 Lumbago with sciatica, right side: Secondary | ICD-10-CM

## 2013-10-11 DIAGNOSIS — F17200 Nicotine dependence, unspecified, uncomplicated: Secondary | ICD-10-CM | POA: Insufficient documentation

## 2013-10-11 HISTORY — DX: Sciatica, unspecified side: M54.30

## 2013-10-11 LAB — URINALYSIS, ROUTINE W REFLEX MICROSCOPIC
Bilirubin Urine: NEGATIVE
Glucose, UA: NEGATIVE mg/dL
HGB URINE DIPSTICK: NEGATIVE
Ketones, ur: NEGATIVE mg/dL
Leukocytes, UA: NEGATIVE
Nitrite: NEGATIVE
Protein, ur: NEGATIVE mg/dL
SPECIFIC GRAVITY, URINE: 1.018 (ref 1.005–1.030)
UROBILINOGEN UA: 0.2 mg/dL (ref 0.0–1.0)
pH: 6 (ref 5.0–8.0)

## 2013-10-11 LAB — I-STAT CHEM 8, ED
BUN: 8 mg/dL (ref 6–23)
CREATININE: 0.9 mg/dL (ref 0.50–1.10)
Calcium, Ion: 1.42 mmol/L — ABNORMAL HIGH (ref 1.12–1.23)
Chloride: 103 mEq/L (ref 96–112)
GLUCOSE: 170 mg/dL — AB (ref 70–99)
HCT: 48 % — ABNORMAL HIGH (ref 36.0–46.0)
Hemoglobin: 16.3 g/dL — ABNORMAL HIGH (ref 12.0–15.0)
POTASSIUM: 3.6 meq/L — AB (ref 3.7–5.3)
SODIUM: 140 meq/L (ref 137–147)
TCO2: 27 mmol/L (ref 0–100)

## 2013-10-11 MED ORDER — HYDROCHLOROTHIAZIDE 25 MG PO TABS: 25.0000 mg | ORAL_TABLET | Freq: Every day | ORAL | Status: DC

## 2013-10-11 MED ORDER — OXYCODONE-ACETAMINOPHEN 5-325 MG PO TABS
2.0000 | ORAL_TABLET | Freq: Once | ORAL | Status: AC
  Administered 2013-10-11: 2 via ORAL
  Filled 2013-10-11: qty 2

## 2013-10-11 MED ORDER — OXYCODONE-ACETAMINOPHEN 5-325 MG PO TABS: 1.0000 | ORAL_TABLET | ORAL | Status: DC | PRN

## 2013-10-11 NOTE — Discharge Instructions (Signed)
Try using heat on your back and doing some gentle exercises to improve your discomfort. Do not take the narcotic pain medication while working or driving. It is best to use nonnarcotic treatments like Tylenol for pain. Your blood pressure is high and needs to be treated. We will start some medication, but you will need to follow up with a primary care doctor for ongoing management. Do not stop taking the blood pressure medicine unless advised to do so, by a physician. Your blood sugar is high, indicating that you may have diabetes. Try to avoid concentrated sweets. Walking, as an exercise, will help your blood sugar and your blood pressure. Ultimately, you will likely need to lose weight, as well, to improve these problems.    Back Pain, Adult Back pain is very common. The pain often gets better over time. The cause of back pain is usually not dangerous. Most people can learn to manage their back pain on their own.  HOME CARE   Stay active. Start with short walks on flat ground if you can. Try to walk farther each day.  Do not sit, drive, or stand in one place for more than 30 minutes. Do not stay in bed.  Do not avoid exercise or work. Activity can help your back heal faster.  Be careful when you bend or lift an object. Bend at your knees, keep the object close to you, and do not twist.  Sleep on a firm mattress. Lie on your side, and bend your knees. If you lie on your back, put a pillow under your knees.  Only take medicines as told by your doctor.  Put ice on the injured area.  Put ice in a plastic bag.  Place a towel between your skin and the bag.  Leave the ice on for 15-20 minutes, 03-04 times a day for the first 2 to 3 days. After that, you can switch between ice and heat packs.  Ask your doctor about back exercises or massage.  Avoid feeling anxious or stressed. Find good ways to deal with stress, such as exercise. GET HELP RIGHT AWAY IF:   Your pain does not go  away with rest or medicine.  Your pain does not go away in 1 week.  You have new problems.  You do not feel well.  The pain spreads into your legs.  You cannot control when you poop (bowel movement) or pee (urinate).  Your arms or legs feel weak or lose feeling (numbness).  You feel sick to your stomach (nauseous) or throw up (vomit).  You have belly (abdominal) pain.  You feel like you may pass out (faint). MAKE SURE YOU:   Understand these instructions.  Will watch your condition.  Will get help right away if you are not doing well or get worse. Document Released: 07/01/2007 Document Revised: 04/06/2011 Document Reviewed: 05/16/2013 [facility] Patient Information 2015 [place], [place]. This information is not intended to replace advice given to you by your health care provider. Make sure you discuss any questions you have with your health care provider.  Back Exercises Back exercises help treat and prevent back injuries. The goal of back exercises is to increase the strength of your abdominal and back muscles and the flexibility of your back. These exercises should be started when you no longer have back pain. Back exercises include:  Pelvic Tilt. Lie on your back with your knees bent. Tilt your pelvis until the lower part of your back is against the  floor. Hold this position 5 to 10 sec and repeat 5 to 10 times.  Knee to Chest. Pull first 1 knee up against your chest and hold for 20 to 30 seconds, repeat this with the other knee, and then both knees. This may be done with the other leg straight or bent, whichever feels better.  Sit-Ups or Curl-Ups. Bend your knees 90 degrees. Start with tilting your pelvis, and do a partial, slow sit-up, lifting your trunk only 30 to 45 degrees off the floor. Take at least 2 to 3 seconds for each sit-up. Do not do sit-ups with your knees out straight. If partial sit-ups are difficult, simply do the above but with only tightening your abdominal  muscles and holding it as directed.  Hip-Lift. Lie on your back with your knees flexed 90 degrees. Push down with your feet and shoulders as you raise your hips a couple inches off the floor; hold for 10 seconds, repeat 5 to 10 times.  Back arches. Lie on your stomach, propping yourself up on bent elbows. Slowly press on your hands, causing an arch in your low back. Repeat 3 to 5 times. Any initial stiffness and discomfort should lessen with repetition over time.  Shoulder-Lifts. Lie face down with arms beside your body. Keep hips and torso pressed to floor as you slowly lift your head and shoulders off the floor. Do not overdo your exercises, especially in the beginning. Exercises may cause you some mild back discomfort which lasts for a few minutes; however, if the pain is more severe, or lasts for more than 15 minutes, do not continue exercises until you see your caregiver. Improvement with exercise therapy for back problems is slow.  See your caregivers for assistance with developing a proper back exercise program. Document Released: 02/20/2004 Document Revised: 04/06/2011 Document Reviewed: 11/13/2010 [facility] Patient Information 2015 [place], [place]. This information is not intended to replace advice given to you by your health care provider. Make sure you discuss any questions you have with your health care provider.  DASH Eating Plan DASH stands for "Dietary Approaches to Stop Hypertension." The DASH eating plan is a healthy eating plan that has been shown to reduce high blood pressure (hypertension). Additional health benefits may include reducing the risk of type 2 diabetes mellitus, heart disease, and stroke. The DASH eating plan may also help with weight loss. WHAT DO I NEED TO KNOW ABOUT THE DASH EATING PLAN? For the DASH eating plan, you will follow these general guidelines:  Choose foods with a percent daily value for sodium of less than 5% (as listed on the food label).  Use  salt-free seasonings or herbs instead of table salt or sea salt.  Check with your health care provider or pharmacist before using salt substitutes.  Eat lower-sodium products, often labeled as "lower sodium" or "no salt added."  Eat fresh foods.  Eat more vegetables, fruits, and low-fat dairy products.  Choose whole grains. Look for the word "whole" as the first word in the ingredient list.  Choose fish and skinless chicken or [place] more often than red meat. Limit fish, poultry, and meat to 6 oz (170 g) each day.  Limit sweets, desserts, sugars, and sugary drinks.  Choose heart-healthy fats.  Limit cheese to 1 oz (28 g) per day.  Eat more home-cooked food and less restaurant, buffet, and fast food.  Limit fried foods.  Cook foods using methods other than frying.  Limit canned vegetables. If you do use them, rinse  them well to decrease the sodium.  When eating at a restaurant, ask that your food be prepared with less salt, or no salt if possible. WHAT FOODS CAN I EAT? Seek help from a dietitian for individual calorie needs. Grains Whole grain or whole wheat bread. Brown rice. Whole grain or whole wheat pasta. Quinoa, bulgur, and whole grain cereals. Low-sodium cereals. Corn or whole wheat flour tortillas. Whole grain cornbread. Whole grain crackers. Low-sodium crackers. Vegetables Fresh or frozen vegetables (raw, steamed, roasted, or grilled). Low-sodium or reduced-sodium tomato and vegetable juices. Low-sodium or reduced-sodium tomato sauce and paste. Low-sodium or reduced-sodium canned vegetables.  Fruits All fresh, canned (in natural juice), or frozen fruits. Meat and Other Protein Products Ground beef (85% or leaner), grass-fed beef, or beef trimmed of fat. Skinless chicken or [place]. Ground chicken or [place]. Pork trimmed of fat. All fish and seafood. Eggs. Dried beans, peas, or lentils. Unsalted nuts and seeds. Unsalted canned beans. Dairy Low-fat dairy products, such as  skim or 1% milk, 2% or reduced-fat cheeses, low-fat ricotta or cottage cheese, or plain low-fat yogurt. Low-sodium or reduced-sodium cheeses. Fats and Oils Tub margarines without trans fats. Light or reduced-fat mayonnaise and salad dressings (reduced sodium). Avocado. Safflower, olive, or canola oils. Natural peanut or almond butter. Other Unsalted popcorn and pretzels. The items listed above may not be a complete list of recommended foods or beverages. Contact your dietitian for more options. WHAT FOODS ARE NOT RECOMMENDED? Grains White bread. White pasta. White rice. Refined cornbread. Bagels and croissants. Crackers that contain trans fat. Vegetables Creamed or fried vegetables. Vegetables in a cheese sauce. Regular canned vegetables. Regular canned tomato sauce and paste. Regular tomato and vegetable juices. Fruits Dried fruits. Canned fruit in light or heavy syrup. Fruit juice. Meat and Other Protein Products Fatty cuts of meat. Ribs, chicken wings, bacon, sausage, bologna, salami, chitterlings, fatback, hot dogs, bratwurst, and packaged luncheon meats. Salted nuts and seeds. Canned beans with salt. Dairy Whole or 2% milk, cream, half-and-half, and cream cheese. Whole-fat or sweetened yogurt. Full-fat cheeses or blue cheese. Nondairy creamers and whipped toppings. Processed cheese, cheese spreads, or cheese curds. Condiments Onion and garlic salt, seasoned salt, table salt, and sea salt. Canned and packaged gravies. Worcestershire sauce. Tartar sauce. Barbecue sauce. Teriyaki sauce. Soy sauce, including reduced sodium. Steak sauce. Fish sauce. Oyster sauce. Cocktail sauce. Horseradish. Ketchup and mustard. Meat flavorings and tenderizers. Bouillon cubes. Hot sauce. Tabasco sauce. Marinades. Taco seasonings. Relishes. Fats and Oils Butter, stick margarine, lard, shortening, ghee, and bacon fat. Coconut, palm kernel, or palm oils. Regular salad dressings. Other Pickles and olives. Salted  popcorn and pretzels. The items listed above may not be a complete list of foods and beverages to avoid. Contact your dietitian for more information. WHERE CAN I FIND MORE INFORMATION? National [employer], [employer], [employer]: [email or web address] Document Released: 01/01/2011 Document Revised: 05/29/2013 Document Reviewed: 11/16/2012 [facility] Patient Information 2015 [place], [place]. This information is not intended to replace advice given to you by your health care provider. Make sure you discuss any questions you have with your health care provider.  Hyperglycemia Hyperglycemia occurs when the glucose (sugar) in your blood is too high. Hyperglycemia can happen for many reasons, but it most often happens to people who do not know they have diabetes or are not managing their diabetes properly.  CAUSES  Whether you have diabetes or not, there are other causes of hyperglycemia. Hyperglycemia can occur when you have diabetes, but it can also occur in  other situations that you might not be as aware of, such as: Diabetes  If you have diabetes and are having problems controlling your blood glucose, hyperglycemia could occur because of some of the following reasons:  Not following your meal plan.  Not taking your diabetes medications or not taking it properly.  Exercising less or doing less activity than you normally do.  Being sick. Pre-diabetes  This cannot be ignored. Before people develop Type 2 diabetes, they almost always have "pre-diabetes." This is when your blood glucose levels are higher than normal, but not yet high enough to be diagnosed as diabetes. Research has shown that some long-term damage to the body, especially the heart and circulatory system, may already be occurring during pre-diabetes. If you take action to manage your blood glucose when you have pre-diabetes, you may delay or prevent Type 2 diabetes from developing. Stress  If you have  diabetes, you may be "diet" controlled or on oral medications or insulin to control your diabetes. However, you may find that your blood glucose is higher than usual in the hospital whether you have diabetes or not. This is often referred to as "stress hyperglycemia." Stress can elevate your blood glucose. This happens because of hormones put out by the body during times of stress. If stress has been the cause of your high blood glucose, it can be followed regularly by your caregiver. That way he/she can make sure your hyperglycemia does not continue to get worse or progress to diabetes. Steroids  Steroids are medications that act on the infection fighting system (immune system) to block inflammation or infection. One side effect can be a rise in blood glucose. Most people can produce enough extra insulin to allow for this rise, but for those who cannot, steroids make blood glucose levels go even higher. It is not unusual for steroid treatments to "uncover" diabetes that is developing. It is not always possible to determine if the hyperglycemia will go away after the steroids are stopped. A special blood test called an A1c is sometimes done to determine if your blood glucose was elevated before the steroids were started. SYMPTOMS  Thirsty.  Frequent urination.  Dry mouth.  Blurred vision.  Tired or fatigue.  Weakness.  Sleepy.  Tingling in feet or leg. DIAGNOSIS  Diagnosis is made by monitoring blood glucose in one or all of the following ways:  A1c test. This is a chemical found in your blood.  Fingerstick blood glucose monitoring.  Laboratory results. TREATMENT  First, knowing the cause of the hyperglycemia is important before the hyperglycemia can be treated. Treatment may include, but is not be limited to:  Education.  Change or adjustment in medications.  Change or adjustment in meal plan.  Treatment for an illness, infection, etc.  More frequent blood glucose  monitoring.  Change in exercise plan.  Decreasing or stopping steroids.  Lifestyle changes. HOME CARE INSTRUCTIONS   Test your blood glucose as directed.  Exercise regularly. Your caregiver will give you instructions about exercise. Pre-diabetes or diabetes which comes on with stress is helped by exercising.  Eat wholesome, balanced meals. Eat often and at regular, fixed times. Your caregiver or nutritionist will give you a meal plan to guide your sugar intake.  Being at an ideal weight is important. If needed, losing as little as 10 to 15 pounds may help improve blood glucose levels. SEEK MEDICAL CARE IF:   You have questions about medicine, activity, or diet.  You continue to have  symptoms (problems such as increased thirst, urination, or weight gain). SEEK IMMEDIATE MEDICAL CARE IF:   You are vomiting or have diarrhea.  Your breath smells fruity.  You are breathing faster or slower.  You are very sleepy or incoherent.  You have numbness, tingling, or pain in your feet or hands.  You have chest pain.  Your symptoms get worse even though you have been following your caregiver's orders.  If you have any other questions or concerns. Document Released: 07/08/2000 Document Revised: 04/06/2011 Document Reviewed: 05/11/2011 [facility] Patient Information 2015 [place], [place]. This information is not intended to replace advice given to you by your health care provider. Make sure you discuss any questions you have with your health care provider.  Hypertension Hypertension, commonly called high blood pressure, is when the force of blood pumping through your arteries is too strong. Your arteries are the blood vessels that carry blood from your heart throughout your body. A blood pressure reading consists of a higher number over a lower number, such as 110/72. The higher number (systolic) is the pressure inside your arteries when your heart pumps. The lower number (diastolic) is the  pressure inside your arteries when your heart relaxes. Ideally you want your blood pressure below 120/80. Hypertension forces your heart to work harder to pump blood. Your arteries may become narrow or stiff. Having hypertension puts you at risk for heart disease, stroke, and other problems.  RISK FACTORS Some risk factors for high blood pressure are controllable. Others are not.  Risk factors you cannot control include:   Race. You may be at higher risk if you are African American.  Age. Risk increases with age.  Gender. Men are at higher risk than women before age 72 years. After age 73, women are at higher risk than men. Risk factors you can control include:  Not getting enough exercise or physical activity.  Being overweight.  Getting too much fat, sugar, calories, or salt in your diet.  Drinking too much alcohol. SIGNS AND SYMPTOMS Hypertension does not usually cause signs or symptoms. Extremely high blood pressure (hypertensive crisis) may cause headache, anxiety, shortness of breath, and nosebleed. DIAGNOSIS  To check if you have hypertension, your health care provider will measure your blood pressure while you are seated, with your arm held at the level of your heart. It should be measured at least twice using the same arm. Certain conditions can cause a difference in blood pressure between your right and left arms. A blood pressure reading that is higher than normal on one occasion does not mean that you need treatment. If one blood pressure reading is high, ask your health care provider about having it checked again. TREATMENT  Treating high blood pressure includes making lifestyle changes and possibly taking medicine. Living a healthy lifestyle can help lower high blood pressure. You may need to change some of your habits. Lifestyle changes may include:  Following the DASH diet. This diet is high in fruits, vegetables, and whole grains. It is low in salt, red meat, and added  sugars.  Getting at least 2 hours of brisk physical activity every week.  Losing weight if necessary.  Not smoking.  Limiting alcoholic beverages.  Learning ways to reduce stress. If lifestyle changes are not enough to get your blood pressure under control, your health care provider may prescribe medicine. You may need to take more than one. Work closely with your health care provider to understand the risks and benefits. HOME CARE INSTRUCTIONS  Have your blood pressure rechecked as directed by your health care provider.   Take medicines only as directed by your health care provider. Follow the directions carefully. Blood pressure medicines must be taken as prescribed. The medicine does not work as well when you skip doses. Skipping doses also puts you at risk for problems.   Do not smoke.   Monitor your blood pressure at home as directed by your health care provider. SEEK MEDICAL CARE IF:   You think you are having a reaction to medicines taken.  You have recurrent headaches or feel dizzy.  You have swelling in your ankles.  You have trouble with your vision. SEEK IMMEDIATE MEDICAL CARE IF:  You develop a severe headache or confusion.  You have unusual weakness, numbness, or feel faint.  You have severe chest or abdominal pain.  You vomit repeatedly.  You have trouble breathing. MAKE SURE YOU:   Understand these instructions.  Will watch your condition.  Will get help right away if you are not doing well or get worse. Document Released: 01/12/2005 Document Revised: 05/29/2013 Document Reviewed: 11/04/2012 [facility] Patient Information 2015 [place], [place]. This information is not intended to replace advice given to you by your health care provider. Make sure you discuss any questions you have with your health care provider.

## 2013-10-11 NOTE — ED Notes (Signed)
Pt c/o R lower back pain radiating down R leg x 2 months.  Pain score 10/10.  Pt reports taking Tylenol PM for pain.  Pt has been seen previously for same and diagnosed w/ sciatica.  Pt reports she has been unable to follow up w/ a MD, due to lack of insurance.  Hx of sciatica and HTN.  Sts she does not take HTN medication, because "it makes her heart race."

## 2013-10-11 NOTE — Progress Notes (Signed)
[facility] Community Liaison [place],  [place] with patient about community resources to help her establish primary care. Provided pt with a list of self-pay providers and [facility] Card application. Also, spoke with patient about [facility] and provided her with contact information.

## 2013-10-11 NOTE — ED Provider Notes (Signed)
CSN: [id number]     Arrival date & time 10/11/13  1478 History   First MD Initiated Contact with Patient 10/11/13 0750     Chief Complaint  Patient presents with  . Sciatica  . Hypertension     (Consider location/radiation/quality/duration/timing/severity/associated sxs/prior Treatment) Patient is a 48 y.o. female presenting with hypertension. The history is provided by the patient.  Hypertension   She presents for evaluation of persistent, right sided sciatica, not relieved by oral Tylenol. She is unable to take anti-inflammatory medicines due to anaphylaxis with ibuprofen. She's not currently have a primary care provider. She is a known hypertensive, but does not take medication. She denies headache, weakness, dizziness, nausea, vomiting, urinary or bowel abnormalities. She works at a desk job. There are no other known modifying factors  Past Medical History  Diagnosis Date  . Hypertension   . Environmental allergies   . Sciatica    Past Surgical History  Procedure Laterality Date  . Partial hysterectomy     History reviewed. No pertinent family history. History  Substance Use Topics  . Smoking status: Current Every Day Smoker  . Smokeless tobacco: Not on file  . Alcohol Use: No   OB History   Grav Para Term Preterm Abortions TAB SAB Ect Mult Living                 Review of Systems  All other systems reviewed and are negative.     Allergies  Aspirin and Ibuprofen  Home Medications   Prior to Admission medications   Medication Sig Start Date End Date Taking? Authorizing Provider  acetaminophen (TYLENOL) 500 MG tablet Take 1,000 mg by mouth every 6 (six) hours as needed. For pain   Yes Historical Provider, MD  albuterol (PROVENTIL HFA;VENTOLIN HFA) 108 (90 BASE) MCG/ACT inhaler Inhale 2 puffs into the lungs every 6 (six) hours as needed for wheezing or shortness of breath.   Yes Historical Provider, MD  cyclobenzaprine (FLEXERIL) 10 MG tablet Take 10 mg by mouth  3 (three) times daily as needed for muscle spasms.   Yes Historical Provider, MD  EPINEPHrine (EPI-PEN) 0.3 mg/0.3 mL DEVI Inject 0.3 mg into the muscle as needed. For allergic reaction 02/05/11  Yes Catherine E Schinlever, PA-C  famotidine (PEPCID) 10 MG tablet Take 10 mg by mouth 2 (two) times daily as needed. For acid reflux   Yes Historical Provider, MD  ipratropium (ATROVENT) 0.06 % nasal spray Place 2 sprays into both nostrils 4 (four) times daily as needed for rhinitis.   Yes Historical Provider, MD  hydrochlorothiazide (HYDRODIURIL) 25 MG tablet Take 1 tablet (25 mg total) by mouth daily. 10/11/13   Flint Melter, MD  oxyCODONE-acetaminophen (PERCOCET) 5-325 MG per tablet Take 1 tablet by mouth every 4 (four) hours as needed. 10/11/13   Flint Melter, MD  oxyCODONE-acetaminophen (PERCOCET/ROXICET) 5-325 MG per tablet Take 1-2 tablets by mouth every 6 (six) hours as needed for severe pain.    Historical Provider, MD  predniSONE (DELTASONE) 50 MG tablet Take 50 mg by mouth daily with breakfast.    Historical Provider, MD   BP 152/91  Pulse 83  Temp(Src) 97.1 F (36.2 C) (Oral)  Resp 20  SpO2 97% Physical Exam  Nursing note and vitals reviewed. Constitutional: She is oriented to person, place, and time. She appears well-developed and well-nourished.  HENT:  Head: Normocephalic and atraumatic.  Eyes: Conjunctivae and EOM are normal. Pupils are equal, round, and reactive to light.  Neck:  Normal range of motion and phonation normal. Neck supple.  Cardiovascular: Normal rate and regular rhythm.   Pulmonary/Chest: Effort normal and breath sounds normal. She exhibits no tenderness.  Abdominal: Soft. She exhibits no distension. There is no tenderness. There is no guarding.  Musculoskeletal: Normal range of motion.  Moderate diffuse lumbar tenderness to palpation. She is able to sit on the stretcher without significant pain. Negative straight leg raising bilaterally.   Neurological: She is  alert and oriented to person, place, and time. She exhibits normal muscle tone.  Skin: Skin is warm and dry.  Psychiatric: She has a normal mood and affect. Her behavior is normal. Judgment and thought content normal.    ED Course  Procedures (including critical care time)  Persistant, untreated hypertension. Will require evaluation and treatment, since she does not have a PCP  Medications  oxyCODONE-acetaminophen (PERCOCET/ROXICET) 5-325 MG per tablet 2 tablet (2 tablets Oral Given 10/11/13 0849)    Patient Vitals for the past 24 hrs:  BP Temp Temp src Pulse Resp SpO2  10/11/13 0933 152/91 mmHg - - 83 20 97 %  10/11/13 0745 170/110 mmHg 97.1 F (36.2 C) Oral 93 16 95 %    10:02 AM Reevaluation with update and discussion. After initial assessment and treatment, an updated evaluation reveals she is comfortable now. Repeat blood pressure is still elevated. Findings discussed with patient, all questions answered. Rodricus Candelaria L     Labs Review Labs Reviewed  URINALYSIS, ROUTINE W REFLEX MICROSCOPIC - Abnormal; Notable for the following:    APPearance CLOUDY (*)    All other components within normal limits  I-STAT CHEM 8, ED - Abnormal; Notable for the following:    Potassium 3.6 (*)    Glucose, Bld 170 (*)    Calcium, Ion 1.42 (*)    Hemoglobin 16.3 (*)    HCT 48.0 (*)    All other components within normal limits    Imaging Review No results found.   EKG Interpretation None      MDM   Final diagnoses:  Bilateral low back pain with right-sided sciatica  Essential hypertension  Hyperglycemia    Nonspecific low back pain with symptoms consistent with sciatica. Patient is morbidly obese and this is likely the primary cause of her discomfort. She has incidental hypertension, and hyperglycemia that are asymptomatic.  There is no indication for further treatment, urgently or hospitalization, at this time. Patient understands the significance of her abnormal findings and  is committed to seeking care. She was seen by care management team with instructions given for followup care options.   Nursing Notes Reviewed/ Care Coordinated Applicable Imaging Reviewed Interpretation of Laboratory Data incorporated into ED treatment  The patient appears reasonably screened and/or stabilized for discharge and I doubt any other medical condition or other [facility] requiring further screening, evaluation, or treatment in the ED at this time prior to discharge.  Plan: Home Medications- Percocet, HCTZ; Home Treatments- rest, low carb. Diet, mild exercise; return here if the recommended treatment, does not improve the symptoms; Recommended follow up- PCP of choice asap    Flint Melter, MD 10/11/13 1014

## 2013-11-22 ENCOUNTER — Encounter (HOSPITAL_COMMUNITY): Payer: Self-pay | Admitting: Emergency Medicine

## 2013-11-22 ENCOUNTER — Emergency Department (HOSPITAL_COMMUNITY)
Admission: EM | Admit: 2013-11-22 | Discharge: 2013-11-22 | Disposition: A | Discharge status: Home / Self Care | Payer: 59 | Attending: Emergency Medicine | Admitting: Emergency Medicine

## 2013-11-22 DIAGNOSIS — Z720 Tobacco use: Secondary | ICD-10-CM | POA: Insufficient documentation

## 2013-11-22 DIAGNOSIS — K088 Other specified disorders of teeth and supporting structures: Secondary | ICD-10-CM | POA: Insufficient documentation

## 2013-11-22 DIAGNOSIS — I159 Secondary hypertension, unspecified: Secondary | ICD-10-CM | POA: Insufficient documentation

## 2013-11-22 DIAGNOSIS — Z79899 Other long term (current) drug therapy: Secondary | ICD-10-CM | POA: Insufficient documentation

## 2013-11-22 DIAGNOSIS — K029 Dental caries, unspecified: Secondary | ICD-10-CM | POA: Insufficient documentation

## 2013-11-22 DIAGNOSIS — Z8739 Personal history of other diseases of the musculoskeletal system and connective tissue: Secondary | ICD-10-CM | POA: Insufficient documentation

## 2013-11-22 DIAGNOSIS — K0889 Other specified disorders of teeth and supporting structures: Secondary | ICD-10-CM

## 2013-11-22 MED ORDER — BUPIVACAINE-EPINEPHRINE (PF) 0.5% -1:200000 IJ SOLN
1.8000 mL | Freq: Once | INTRAMUSCULAR | Status: AC
  Administered 2013-11-22: 1.8 mL
  Filled 2013-11-22: qty 1.8

## 2013-11-22 MED ORDER — AMOXICILLIN 500 MG PO CAPS: 500.0000 mg | ORAL_CAPSULE | Freq: Three times a day (TID) | ORAL | Status: DC

## 2013-11-22 MED ORDER — OXYCODONE-ACETAMINOPHEN 5-325 MG PO TABS
2.0000 | ORAL_TABLET | Freq: Once | ORAL | Status: AC
  Administered 2013-11-22: 2 via ORAL
  Filled 2013-11-22: qty 2

## 2013-11-22 MED ORDER — HYDROCHLOROTHIAZIDE 25 MG PO TABS
25.0000 mg | ORAL_TABLET | Freq: Once | ORAL | Status: DC
  Filled 2013-11-22: qty 1

## 2013-11-22 MED ORDER — HYDROCHLOROTHIAZIDE 25 MG PO TABS: 25.0000 mg | ORAL_TABLET | Freq: Every day | ORAL | Status: DC

## 2013-11-22 MED ORDER — OXYCODONE-ACETAMINOPHEN 10-325 MG PO TABS: 1.0000 | ORAL_TABLET | ORAL | Status: DC | PRN

## 2013-11-22 NOTE — Discharge Instructions (Signed)

## 2013-11-22 NOTE — ED Notes (Signed)
Pt c/o dental pain x 2 days.  Hx of htn.  Has not taken her BP meds today.

## 2013-11-22 NOTE — ED Provider Notes (Signed)
CSN: [id number]     Arrival date & time 11/22/13  0913 History   First MD Initiated Contact with Patient 11/22/13 [phone]     Chief Complaint  Patient presents with  . Dental Pain     (Consider location/radiation/quality/duration/timing/severity/associated sxs/prior Treatment) HPI  Patient presents to the emergency department with a dental complaint. Symptoms began 2 days ago. The patient has tried to alleviate pain with Percocet 5mg .  Pain rated at a 10/10, characterized as throbbing in nature and located left lower molar. Patient denies fever, night sweats, chills, difficulty swallowing or opening mouth, SOB, nuchal rigidity or decreased ROM of neck.  Patient does not have a dentist and requests a resource guide at discharge.   Past Medical History  Diagnosis Date  . Hypertension   . Environmental allergies   . Sciatica    Past Surgical History  Procedure Laterality Date  . Partial hysterectomy     No family history on file. History  Substance Use Topics  . Smoking status: Current Every Day Smoker  . Smokeless tobacco: Not on file  . Alcohol Use: No   OB History   Grav Para Term Preterm Abortions TAB SAB Ect Mult Living                 Review of Systems  10 Systems reviewed and are negative for acute change except as noted in the HPI.    Allergies  Aspirin and Ibuprofen  Home Medications   Prior to Admission medications   Medication Sig Start Date End Date Taking? Authorizing Provider  acetaminophen (TYLENOL) 500 MG tablet Take 1,000 mg by mouth every 6 (six) hours as needed. For pain   Yes Historical Provider, MD  albuterol (PROVENTIL HFA;VENTOLIN HFA) 108 (90 BASE) MCG/ACT inhaler Inhale 2 puffs into the lungs every 6 (six) hours as needed for wheezing or shortness of breath.   Yes Historical Provider, MD  cyclobenzaprine (FLEXERIL) 10 MG tablet Take 10 mg by mouth 3 (three) times daily as needed for muscle spasms.   Yes Historical Provider, MD  EPINEPHrine  (EPI-PEN) 0.3 mg/0.3 mL DEVI Inject 0.3 mg into the muscle as needed. For allergic reaction 02/05/11  Yes 04/05/11 Schinlever, PA-C  oxyCODONE-acetaminophen (PERCOCET) 5-325 MG per tablet Take 1 tablet by mouth every 4 (four) hours as needed. 10/11/13  Yes 10/13/13, MD  amoxicillin (AMOXIL) 500 MG capsule Take 1 capsule (500 mg total) by mouth 3 (three) times daily. 11/22/13   Yasamin Karel 11/24/13, PA-C  hydrochlorothiazide (HYDRODIURIL) 25 MG tablet Take 1 tablet (25 mg total) by mouth daily. 11/22/13   Yehya Brendle 11/24/13, PA-C  oxyCODONE-acetaminophen (PERCOCET) 10-325 MG per tablet Take 1 tablet by mouth every 4 (four) hours as needed for pain. 11/22/13   Devron Cohick 11/24/13, PA-C   BP 197/107  Pulse 100  Temp(Src) 98.1 F (36.7 C) (Oral)  Resp 18  SpO2 100% Physical Exam  Constitutional: She appears well-developed and well-nourished. No distress.  HENT:  Head: Normocephalic and atraumatic.  Mouth/Throat: Uvula is midline, oropharynx is clear and moist and mucous membranes are normal. Normal dentition. Dental caries (Pts tooth shows no obvious abscess but moderate to severe tenderness to palpation of marked tooth) present. No uvula swelling.    Eyes: Pupils are equal, round, and reactive to light.  Neck: Trachea normal, normal range of motion and full passive range of motion without pain. Neck supple.  Cardiovascular: Normal rate, regular rhythm, normal heart sounds and normal pulses.  Pulmonary/Chest: Effort normal and breath sounds normal. No respiratory distress. Chest wall is not dull to percussion. She exhibits no tenderness, no crepitus, no edema, no deformity and no retraction.  Abdominal: Normal appearance.  Musculoskeletal: Normal range of motion.  Neurological: She is alert. She has normal strength.  Skin: Skin is warm, dry and intact. She is not diaphoretic.  Psychiatric: She has a normal mood and affect. Her speech is normal. Cognition and memory are normal.      ED  Course  Procedures (including critical care time) Labs Review Labs Reviewed - No data to display  Imaging Review No results found.   EKG Interpretation None      MDM   Final diagnoses:  Pain, dental  Secondary hypertension, unspecified    NERVE BLOCK Date/Time: 11:19 am 11/22/13 Performed by: Dorthula Matas Authorized by: Dorthula Matas Consent: Verbal consent obtained. Risks and benefits: risks, benefits and alternatives were discussed Consent given by: patient Indications: pain relief Body area: face/mouth Laterality: left Needle gauge: 25 G Local anesthetic: lidocaine 2% without epinephrine Anesthetic total: 2 ml Outcome: pain improved Patient tolerance: Patient tolerated the procedure well with no immediate complications. Comments: Patient had complete relief of pain.    Patient has dental pain. No emergent s/sx's present. Patent airway. No trismus.  Will be given pain medication and antibiotics. I discussed the need to call dentist within 24/48 hours for follow-up. Dental referral given. Return to ED precautions given.  Pt voiced understanding and has agreed to follow-up.   48 y.o.Evann Koelzer evaluation in the Emergency Department is complete. It has been determined that no acute conditions requiring further emergency intervention are present at this time. The patient/guardian have been advised of the diagnosis and plan. We have discussed signs and symptoms that warrant return to the ED, such as changes or worsening in symptoms.  Vital signs are stable at discharge. Filed Vitals:   11/22/13 0923  BP: 197/107  Pulse: 100  Temp: 98.1 F (36.7 C)  Resp: 18    Patient/guardian has voiced understanding and agreed to follow-up with the PCP or specialist.    Dorthula Matas, PA-C 11/22/13 1119  Dorthula Matas, PA-C 11/22/13 1120

## 2013-11-23 NOTE — ED Provider Notes (Signed)
Medical screening examination/treatment/procedure(s) were performed by non-physician practitioner and as supervising physician I was immediately available for consultation/collaboration.   EKG Interpretation None        Pedram Goodchild, DO 11/23/13 0742 

## 2014-02-11 ENCOUNTER — Encounter (HOSPITAL_COMMUNITY): Payer: Self-pay | Admitting: *Deleted

## 2014-02-11 ENCOUNTER — Emergency Department (HOSPITAL_COMMUNITY)
Admission: EM | Admit: 2014-02-11 | Discharge: 2014-02-11 | Disposition: A | Discharge status: Home / Self Care | Payer: Self-pay | Attending: Emergency Medicine | Admitting: Emergency Medicine

## 2014-02-11 DIAGNOSIS — K088 Other specified disorders of teeth and supporting structures: Secondary | ICD-10-CM | POA: Insufficient documentation

## 2014-02-11 DIAGNOSIS — Z720 Tobacco use: Secondary | ICD-10-CM | POA: Insufficient documentation

## 2014-02-11 DIAGNOSIS — Z8739 Personal history of other diseases of the musculoskeletal system and connective tissue: Secondary | ICD-10-CM | POA: Insufficient documentation

## 2014-02-11 DIAGNOSIS — Z79899 Other long term (current) drug therapy: Secondary | ICD-10-CM | POA: Insufficient documentation

## 2014-02-11 DIAGNOSIS — K0889 Other specified disorders of teeth and supporting structures: Secondary | ICD-10-CM

## 2014-02-11 DIAGNOSIS — I10 Essential (primary) hypertension: Secondary | ICD-10-CM | POA: Insufficient documentation

## 2014-02-11 DIAGNOSIS — K029 Dental caries, unspecified: Secondary | ICD-10-CM | POA: Insufficient documentation

## 2014-02-11 MED ORDER — BUPIVACAINE-EPINEPHRINE (PF) 0.5% -1:200000 IJ SOLN
1.8000 mL | Freq: Once | INTRAMUSCULAR | Status: AC
  Administered 2014-02-11: 1.8 mL

## 2014-02-11 MED ORDER — OXYCODONE-ACETAMINOPHEN 10-325 MG PO TABS: 1.0000 | ORAL_TABLET | ORAL | Status: DC | PRN

## 2014-02-11 MED ORDER — AMOXICILLIN 500 MG PO CAPS: 500.0000 mg | ORAL_CAPSULE | Freq: Three times a day (TID) | ORAL | Status: DC

## 2014-02-11 NOTE — ED Notes (Signed)
Pt reports left lower dental pain since yesterday. Has been having problems with tooth for a few months but was seen here previously and given amoxicillin and percocet. Sts she had some of both left over and took that today. Reports she has not been able to have tooth pulled yet.

## 2014-02-11 NOTE — ED Provider Notes (Signed)
CSN: [id number]     Arrival date & time 02/11/14  1236 History  This chart was scribed for non-physician practitioner, Lareta Bruneau Bethann Punches, working with Layla Maw Ward, DO by Freida Busman, ED Scribe. This patient was seen in room WTR9/WTR9 and the patient's care was started at 1:27 PM.    Chief Complaint  Patient presents with  . Dental Pain     Patient is a 49 y.o. female presenting with tooth pain. The history is provided by the patient. No language interpreter was used.  Dental Pain Location:  Lower Severity:  Moderate Timing:  Constant Progression:  Worsening Context: enamel fracture   Relieved by:  Dental block Associated symptoms: no drooling and no fever      HPI Comments:  Rebecca Avila is a 49 y.o. female who presents to the Emergency Department complaining of worsening  left lower dental pain which returned 3 days ago. But pain worsened yesterday when she   Cracked the tooth. She has been experiencing the same pain for a few months; during last visit for same was given percocet which she has been taking with mild relief. She denies fever vomiting, CP, and drooling. Pt states she is waiting on her insurance to arrive before she is able to follow up with a dentist.   Past Medical History  Diagnosis Date  . Hypertension   . Environmental allergies   . Sciatica    Past Surgical History  Procedure Laterality Date  . Partial hysterectomy     No family history on file. History  Substance Use Topics  . Smoking status: Current Some Day Smoker  . Smokeless tobacco: Not on file  . Alcohol Use: No   OB History    No data available     Review of Systems  Constitutional: Negative for fever.  HENT: Positive for dental problem. Negative for drooling.   All other systems reviewed and are negative.     Allergies  Aspirin and Ibuprofen  Home Medications   Prior to Admission medications   Medication Sig Start Date End Date Taking? Authorizing Provider  acetaminophen  (TYLENOL) 500 MG tablet Take 1,000 mg by mouth every 6 (six) hours as needed. For pain    Historical Provider, MD  albuterol (PROVENTIL HFA;VENTOLIN HFA) 108 (90 BASE) MCG/ACT inhaler Inhale 2 puffs into the lungs every 6 (six) hours as needed for wheezing or shortness of breath.    Historical Provider, MD  amoxicillin (AMOXIL) 500 MG capsule Take 1 capsule (500 mg total) by mouth 3 (three) times daily. 02/11/14   Koreen Lizaola Irine Seal, PA-C  cyclobenzaprine (FLEXERIL) 10 MG tablet Take 10 mg by mouth 3 (three) times daily as needed for muscle spasms.    Historical Provider, MD  EPINEPHrine (EPI-PEN) 0.3 mg/0.3 mL DEVI Inject 0.3 mg into the muscle as needed. For allergic reaction 02/05/11   Arie Sabina Schinlever, PA-C  hydrochlorothiazide (HYDRODIURIL) 25 MG tablet Take 1 tablet (25 mg total) by mouth daily. 11/22/13   Ceylon Arenson Irine Seal, PA-C  oxyCODONE-acetaminophen (PERCOCET) 10-325 MG per tablet Take 1 tablet by mouth every 4 (four) hours as needed for pain. 02/11/14   Brian Zeitlin Irine Seal, PA-C  oxyCODONE-acetaminophen (PERCOCET) 5-325 MG per tablet Take 1 tablet by mouth every 4 (four) hours as needed. 10/11/13   Flint Melter, MD   BP 184/107 mmHg  Pulse 79  Temp(Src) 98 F (36.7 C) (Oral)  Resp 17  SpO2 95% Physical Exam  Constitutional: She is oriented to person,  place, and time. She appears well-developed and well-nourished. No distress.  HENT:  Head: Normocephalic and atraumatic.  Mouth/Throat: Uvula is midline, oropharynx is clear and moist and mucous membranes are normal. No trismus in the jaw. Normal dentition. Dental caries (Pts tooth shows no obvious abscess but moderate to severe tenderness to palpation of marked tooth) present. No dental abscesses or uvula swelling.    Eyes: Pupils are equal, round, and reactive to light.  Neck: Trachea normal, normal range of motion and full passive range of motion without pain. Neck supple.  Cardiovascular: Normal rate, regular rhythm, normal heart  sounds and normal pulses.   Pulmonary/Chest: Effort normal and breath sounds normal. No respiratory distress. Chest wall is not dull to percussion. She exhibits no tenderness, no crepitus, no edema, no deformity and no retraction.  Abdominal: Normal appearance. She exhibits no distension.  Musculoskeletal: Normal range of motion.  Neurological: She is alert and oriented to person, place, and time. She has normal strength.  Skin: Skin is warm, dry and intact. She is not diaphoretic.  Psychiatric: She has a normal mood and affect. Her speech is normal. Cognition and memory are normal.  Nursing note and vitals reviewed.   ED Course  Procedures   DIAGNOSTIC STUDIES:  Oxygen Saturation is 95% on RA, adequate by my interpretation.    COORDINATION OF CARE:  1:30 PM Will perform dental block and prescribe antibiotic and pain medicine. Discussed treatment plan with pt at bedside and pt agreed to plan.  Labs Review Labs Reviewed - No data to display  Imaging Review No results found.   EKG Interpretation None      MDM   Final diagnoses:  Toothache    NERVE BLOCK Date/Time: 02/11/2014 Performed by: Dorthula Matas Authorized by: Dorthula Matas Consent: Verbal consent obtained. Risks and benefits: risks, benefits and alternatives were discussed Consent given by: patient Indications: pain relief Body area: face/mouth Laterality: left Needle gauge: 25 G Local anesthetic: lidocaine 2% without epinephrine Anesthetic total: 2 ml Outcome: pain improved Patient tolerance: Patient tolerated the procedure well with no immediate complications. Comments: Patient had complete relief of pain.  49 y.o.Rebecca Avila evaluation in the Emergency Department is complete. It has been determined that no acute conditions requiring further emergency intervention are present at this time. The patient/guardian have been advised of the diagnosis and plan. We have discussed signs and symptoms that  warrant return to the ED, such as changes or worsening in symptoms.  Vital signs are stable at discharge. Filed Vitals:   02/11/14 1253  BP: 184/107  Pulse: 79  Temp: 98 F (36.7 C)  Resp: 17    Patient/guardian has voiced understanding and agreed to follow-up with the PCP or specialist.  I personally performed the services described in this documentation, which was scribed in my presence. The recorded information has been reviewed and is accurate.    Dorthula Matas, PA-C 02/11/14 1338  Kristen N Ward, DO 02/11/14 1512

## 2014-02-11 NOTE — Discharge Instructions (Signed)
Dental Pain °A tooth ache may be caused by cavities (tooth decay). Cavities expose the nerve of the tooth to air and hot or cold temperatures. It may come from an infection or abscess (also called a boil or furuncle) around your tooth. It is also often caused by dental caries (tooth decay). This causes the pain you are having. °DIAGNOSIS  °Your caregiver can diagnose this problem by exam. °TREATMENT  °· If caused by an infection, it may be treated with medications which kill germs (antibiotics) and pain medications as prescribed by your caregiver. Take medications as directed. °· Only take over-the-counter or prescription medicines for pain, discomfort, or fever as directed by your caregiver. °· Whether the tooth ache today is caused by infection or dental disease, you should see your dentist as soon as possible for further care. °SEEK MEDICAL CARE IF: °The exam and treatment you received today has been provided on an emergency basis only. This is not a substitute for complete medical or dental care. If your problem worsens or new problems (symptoms) appear, and you are unable to meet with your dentist, call or return to this location. °SEEK IMMEDIATE MEDICAL CARE IF:  °· You have a fever. °· You develop redness and swelling of your face, jaw, or neck. °· You are unable to open your mouth. °· You have severe pain uncontrolled by pain medicine. °MAKE SURE YOU:  °· Understand these instructions. °· Will watch your condition. °· Will get help right away if you are not doing well or get worse. °Document Released: 01/12/2005 Document Revised: 04/06/2011 Document Reviewed: 08/31/2007 °ExitCare® Patient Information ©2015 [employer]. This information is not intended to replace advice given to you by your health care provider. Make sure you discuss any questions you have with your health care provider. ° ° °RESOURCE GUIDE ° °Chronic Pain Problems: °Contact [facility]  297-2271 °Patients need to be  referred by their primary care doctor. ° °Insufficient Money for Medicine: °Contact United Way:  call "211" or [facility] 271-5999. ° °No Primary Care Doctor: °Call [facility]  832-8000 - can help you locate a primary care doctor that  accepts your insurance, provides certain services, etc. °Physician Referral Service- 1-800-533-3463 ° °Agencies that provide inexpensive medical care: °[facility]  [phone] °[facility]  832-7272 °[facility]  [phone] °[facility] Clinic  832-4777 °[facility]anned Parenthood  373-0678 °[facility]  272-1050 ° °Medicaid-accepting [place] Providers: °[facility]- [address] ° [phone], Mon-Fri 9am-7pm, Sat 9am-1pm °[facility]- [address], [address] ° [phone] °[facility]- [address] ° [phone] °[facility]- [address] [place] [address] ° [phone] °[facility]- [address], [phone] ° Only accepts  Access Medicaid patients after they have their name  applied to their card ° °Self Pay (no insurance) in [place]: °Sickle Cell Patients: Dr Eric Dean, [facility] ° [address], [phone] °[facility]- [address] ° [phone] °      -     [facility]- [address] °      -     [facility]- see information above (Speak to Pam H if you do not have insurance) °      -  [facility]- [address], [phone] °      -  [facility] [place]- [address],  [phone] °      -  [facility]- [address] [place] [address], [phone] °      -  Dr Osei-Bonsu-  [address], [place], [phone] °      -  [facility]- [address], [phone] °      -  [facility]- [address] [place] [address], [phone], also [address], [phone] °      -     [facility]- [address], [phone], 1st & 3rd Saturday   every month, 10am-1pm ° °1) Find a Doctor and Pay Out of Pocket °Although you won't have to find out who is covered by your insurance plan, it is a good idea to ask around and get recommendations. You will then need to call the office and see if the doctor you have chosen will accept you as a new patient and what types of options they offer for patients who are self-pay. Some doctors offer discounts or will set up payment plans for their patients who do not have insurance, but you will need to ask so you aren't surprised when you get to your appointment. ° °2) Contact Your Local Health Department °Not all health departments have doctors that can see patients for sick visits, but many do, so it is worth a call to see if yours does. If you don't know where your local health department is, you can check in your phone book. The CDC also has a tool to help you locate your state's health department, and many state websites also have listings of all of their local health departments. ° °3) Find a Walk-in Clinic °If your illness is not likely to be very severe or complicated, you may want to try a walk in clinic. These are popping up all over the country in pharmacies, drugstores, and shopping centers. They're usually staffed by nurse practitioners or physician assistants that have been trained to treat common illnesses and complaints. They're usually fairly quick and inexpensive. However, if you have serious medical issues or chronic medical problems, these are probably not your best option ° °STD Testing °[place] [facility] [place], [facility] Clinic, [address], [place], phone [phone] or 1-877-539-9860.  Monday - Friday, call for an appointment. °[place] [facility] [place], [facility] Clinic, [address], [place], phone [phone] or 1-877-539-9860.  Monday - Friday, call for an  appointment. ° °Abuse/Neglect: °[place] Child Abuse Hotline (336) 641-3795 °[place] Child Abuse Hotline 800-378-5315 (After Hours) ° °Emergency Shelter:  [facility] (336) 271-5985 ° °Maternity Homes: °[facility] (336) 275-9566 °[facility] (704) 372-4663 ° °MRSA Hotline #:   832-7006 ° °[place] Resources ° °[facility] [place]  [employer] [place] [facility] °[address]                 [address]         [address]  °[place]                                               [place]                                [place] °Phone:  349-3220                                  Phone:  [phone]                   Phone:  342-8140 ° °[place] [facility], 342-8316 °[place] [facility] - [facility]- 1-888-581-9988 °      -     [facility] in , [address],                                  336-349-4454, Insurance ° °[place] Child Abuse Hotline °(336) 342-1394 or (336) 342-3537 (After Hours) ° ° °Behavioral Health Services ° °Substance Abuse Resources: °[facility]  336-882-2125 °[facility] 336-784-9470 °[facility] 336-285-9073 °[facility] 336-845-3988 °Residential & Outpatient Substance Abuse Program  800-659-3381 ° °Psychological Services: °[facility]  832-9600 °[facility]  378-7881 °[facility], [address], [place], ACCESS LINE: 1-800-853-5163 or 336-641-4981, [email or web address] ° °Dental Assistance ° °If unable to pay or uninsured, contact:  [facility] or [facility] [facility] to become qualified for the adult dental clinic. ° °Patients with Medicaid: [facility] °[address], [phone] °[address], [phone] ° °If unable to pay, or uninsured, contact  [facility] (271-5999) or [place] [facility] (641-3152 in [place], 842-7733 in [place]) to become qualified for the adult dental clinic ° °Other Low-Cost Community Dental Services: °[facility]- [address], [place], [place], [address], 723-1848, Ext. 123, 2nd and 4th Thursday of the month at 6:30am.  10 clients each day by appointment, can sometimes see walk-in patients if someone does not show for an appointment. °[facility]- [address], [place], [place], [address], [phone] °[facility]- [address], [place], [place], [address], 631-2330 °[facility]- 342-8273 °[facility]- 703-3100 °[facility]- 570-6415 ° °Please make every effort to establish with a primary care physician for routine medical care ° °Adult Health Services  °The [place] [facility] provides a wide range of adult health services. Some of these services are designed to address the healthcare needs of all [place] residents and all services are designed to meet the needs of uninsured/underinsured low income residents. Some services are available to any resident of [place], call [phone] for details. °] °[facility], a new medical clinic for adults, is now open. For more information about the Center and its services please call [phone]. °For information on our [facility] services, click here. ° °For more information on any of the following Department of Public Health programs, including hours of service, click on the highlighted link. ° °SERVICES FOR WOMEN (Adults and Teens) °Family Planning Services provide a full range of birth control options plus education and counseling. New patient visit and annual return visits include a complete examination, pap test as indicated, and other laboratory as indicated. Included is our Regional Vasectomy Program  for men. ° °Maternity Care is provided through pregnancy, including a six week post partum exam. Women who meet eligibility criteria for the Medicaid for Pregnant Women program, receive care free. Other women are charged on a sliding scale according to income. °Note: Our   Dental Clinic provides services to pregnant women who have a Medicaid card. Call [phone] for an appointment in [place] or [phone] for an appointment in [place]. ° °Primary Care for Medicaid [place] [facility] is available through the [place] [facility]. As primary care provider for the [place] Access Medicaid Managed Care program, women may designate the Women’s Health clinic as their primary care provider. ° °PLEASE CALL [phone] FOR AN APPOINTMENT FOR THE ABOVE SERVICES IN EITHER [place] OR [place]. Information available in English and Spanish.  ° °Childbirth Education Classes are open to the public and offered to help families prepare for the best possible childbirth experience as well as to promote lifelong health and wellness. Classes are offered throughout the year and meet on the same night once a week for five weeks. Medicaid covers the cost of the classes for the mother-to-be and her partner. For participants without Medicaid, the cost of the class series is $45.00 for the mother-to-be and her partner. Class size is limited and registration is required. For more information or to register call 336-641-4718. Baby items donated by [employer] and the [employer] [place] are given away during each class series. ° °SERVICES FOR WOMEN AND MEN °Sexually Transmitted Infection appointments, including HIV testing, are available daily (weekdays, except holidays). Call early as same-day appointments are limited. For an appointment in either [place] or [place], call [phone]. Services are confidential and free of charge. ° °Skin Testing for Tuberculosis Please call  [phone]. °Adult Immunizations are available, usually for a fee. Please call [phone] for details. ° °PLEASE CALL [phone] FOR AN APPOINTMENT FOR THE ABOVE SERVICES IN EITHER [place] OR [place].  ° °[facility] provides up to the minute recommended vaccines for your travel destination. We also provide essential health and political information to help insure a safe and pleasurable travel experience. This program is self-sustaining, however, fees are very competitive. We are a CERTIFIED YELLOW FEVER IMMUNIZATION approved clinic site. °PLEASE CALL [phone] FOR AN APPOINTMENT IN EITHER [place] OR [place].  ° °If you have questions about the services listed above, we want to answer them! Email us at: [email or web address] °Home Visiting Services for elderly and the disabled are available to residents of [place] who are in need of care that compares to the care offered by a nursing home, have needs that can be met by the program, and have [place] Medicaid. Other short term services are available to residents 18 years and older who are unable to meet requirements for eligibility to receive services from a certified home health agency, spend the majority of time at home, and need care for six months or less. ° °PLEASE CALL [phone] OR [phone] FOR MORE INFORMATION. °Medication Assistance Program serves as a link between pharmaceutical companies and patients to provide low cost or free prescription medications. This servce is available for residents who meet certain income restrictions and have no insurance coverage. ° °PLEASE CALL [phone] () OR [phone] ([place]) FOR MORE INFORMATION.  °Updated Feb. 21, 2013 ° ° °

## 2014-07-13 ENCOUNTER — Encounter (HOSPITAL_COMMUNITY): Payer: Self-pay | Admitting: Emergency Medicine

## 2014-07-13 ENCOUNTER — Inpatient Hospital Stay (HOSPITAL_COMMUNITY)
Admission: EM | Admit: 2014-07-13 | Discharge: 2014-07-16 | DRG: 638 | Disposition: A | Discharge status: Home / Self Care | Payer: 59 | Attending: Internal Medicine | Admitting: Internal Medicine

## 2014-07-13 DIAGNOSIS — F17210 Nicotine dependence, cigarettes, uncomplicated: Secondary | ICD-10-CM | POA: Diagnosis present

## 2014-07-13 DIAGNOSIS — E1165 Type 2 diabetes mellitus with hyperglycemia: Secondary | ICD-10-CM | POA: Diagnosis present

## 2014-07-13 DIAGNOSIS — E1100 Type 2 diabetes mellitus with hyperosmolarity without nonketotic hyperglycemic-hyperosmolar coma (NKHHC): Secondary | ICD-10-CM | POA: Diagnosis present

## 2014-07-13 DIAGNOSIS — E210 Primary hyperparathyroidism: Secondary | ICD-10-CM

## 2014-07-13 DIAGNOSIS — B370 Candidal stomatitis: Secondary | ICD-10-CM | POA: Diagnosis present

## 2014-07-13 DIAGNOSIS — Z794 Long term (current) use of insulin: Secondary | ICD-10-CM | POA: Diagnosis present

## 2014-07-13 DIAGNOSIS — E119 Type 2 diabetes mellitus without complications: Secondary | ICD-10-CM | POA: Diagnosis present

## 2014-07-13 DIAGNOSIS — B373 Candidiasis of vulva and vagina: Secondary | ICD-10-CM | POA: Diagnosis present

## 2014-07-13 DIAGNOSIS — I4510 Unspecified right bundle-branch block: Secondary | ICD-10-CM | POA: Diagnosis present

## 2014-07-13 DIAGNOSIS — Z90711 Acquired absence of uterus with remaining cervical stump: Secondary | ICD-10-CM | POA: Diagnosis present

## 2014-07-13 DIAGNOSIS — Z8249 Family history of ischemic heart disease and other diseases of the circulatory system: Secondary | ICD-10-CM

## 2014-07-13 DIAGNOSIS — Z6841 Body Mass Index (BMI) 40.0 and over, adult: Secondary | ICD-10-CM | POA: Diagnosis not present

## 2014-07-13 DIAGNOSIS — M5430 Sciatica, unspecified side: Secondary | ICD-10-CM | POA: Diagnosis present

## 2014-07-13 DIAGNOSIS — R0902 Hypoxemia: Secondary | ICD-10-CM

## 2014-07-13 DIAGNOSIS — E876 Hypokalemia: Secondary | ICD-10-CM | POA: Diagnosis not present

## 2014-07-13 DIAGNOSIS — E1101 Type 2 diabetes mellitus with hyperosmolarity with coma: Secondary | ICD-10-CM | POA: Diagnosis not present

## 2014-07-13 DIAGNOSIS — Z79899 Other long term (current) drug therapy: Secondary | ICD-10-CM

## 2014-07-13 DIAGNOSIS — Z886 Allergy status to analgesic agent status: Secondary | ICD-10-CM | POA: Diagnosis not present

## 2014-07-13 DIAGNOSIS — I10 Essential (primary) hypertension: Secondary | ICD-10-CM | POA: Diagnosis present

## 2014-07-13 DIAGNOSIS — E860 Dehydration: Secondary | ICD-10-CM | POA: Diagnosis present

## 2014-07-13 DIAGNOSIS — E1159 Type 2 diabetes mellitus with other circulatory complications: Secondary | ICD-10-CM

## 2014-07-13 LAB — COMPREHENSIVE METABOLIC PANEL
ALBUMIN: 4.5 g/dL (ref 3.5–5.0)
ALK PHOS: 97 U/L (ref 38–126)
ALT: 11 U/L — ABNORMAL LOW (ref 14–54)
ANION GAP: 14 (ref 5–15)
AST: 14 U/L — ABNORMAL LOW (ref 15–41)
BUN: 16 mg/dL (ref 6–20)
CO2: 25 mmol/L (ref 22–32)
CREATININE: 1.13 mg/dL — AB (ref 0.44–1.00)
Calcium: 11 mg/dL — ABNORMAL HIGH (ref 8.9–10.3)
Chloride: 94 mmol/L — ABNORMAL LOW (ref 101–111)
GFR calc non Af Amer: 57 mL/min — ABNORMAL LOW (ref >60)
GLUCOSE: 577 mg/dL — AB (ref 65–99)
POTASSIUM: 3.9 mmol/L (ref 3.5–5.1)
Sodium: 133 mmol/L — ABNORMAL LOW (ref 135–145)
TOTAL PROTEIN: 8.8 g/dL — AB (ref 6.5–8.1)
Total Bilirubin: 0.8 mg/dL (ref 0.3–1.2)

## 2014-07-13 LAB — MRSA PCR SCREENING: MRSA by PCR: NEGATIVE

## 2014-07-13 LAB — CBG MONITORING, ED
GLUCOSE-CAPILLARY: 547 mg/dL — AB (ref 65–99)
GLUCOSE-CAPILLARY: 499 mg/dL — AB (ref 65–99)
Glucose-Capillary: 463 mg/dL — ABNORMAL HIGH (ref 65–99)

## 2014-07-13 LAB — BLOOD GAS, VENOUS
Acid-Base Excess: 1.2 mmol/L (ref 0.0–2.0)
Bicarbonate: 27.7 mEq/L — ABNORMAL HIGH (ref 20.0–24.0)
FIO2: 0.21 %
O2 Saturation: 44.8 %
PATIENT TEMPERATURE: 98.6
TCO2: 23.8 mmol/L (ref 0–100)
pCO2, Ven: 51.6 mmHg — ABNORMAL HIGH (ref 45.0–50.0)
pH, Ven: 7.349 — ABNORMAL HIGH (ref 7.250–7.300)

## 2014-07-13 LAB — I-STAT CG4 LACTIC ACID, ED: LACTIC ACID, VENOUS: 2.58 mmol/L — AB (ref 0.5–2.0)

## 2014-07-13 LAB — URINE MICROSCOPIC-ADD ON

## 2014-07-13 LAB — URINALYSIS, ROUTINE W REFLEX MICROSCOPIC
BILIRUBIN URINE: NEGATIVE
Glucose, UA: >1000 mg/dL — AB
Hgb urine dipstick: NEGATIVE
KETONES UR: 15 mg/dL — AB
Leukocytes, UA: NEGATIVE
Nitrite: NEGATIVE
PROTEIN: NEGATIVE mg/dL
Specific Gravity, Urine: 1.038 — ABNORMAL HIGH (ref 1.005–1.030)
UROBILINOGEN UA: 1 mg/dL (ref 0.0–1.0)
pH: 5.5 (ref 5.0–8.0)

## 2014-07-13 LAB — CBC WITH DIFFERENTIAL/PLATELET
BASOS PCT: 0 % (ref 0–1)
Basophils Absolute: 0 10*3/uL (ref 0.0–0.1)
EOS ABS: 0.2 10*3/uL (ref 0.0–0.7)
Eosinophils Relative: 2 % (ref 0–5)
HCT: 48.2 % — ABNORMAL HIGH (ref 36.0–46.0)
HEMOGLOBIN: 16.1 g/dL — AB (ref 12.0–15.0)
Lymphocytes Relative: 26 % (ref 12–46)
Lymphs Abs: 1.8 10*3/uL (ref 0.7–4.0)
MCH: 27.6 pg (ref 26.0–34.0)
MCHC: 33.4 g/dL (ref 30.0–36.0)
MCV: 82.7 fL (ref 78.0–100.0)
MONOS PCT: 4 % (ref 3–12)
Monocytes Absolute: 0.3 10*3/uL (ref 0.1–1.0)
NEUTROS ABS: 4.7 10*3/uL (ref 1.7–7.7)
NEUTROS PCT: 68 % (ref 43–77)
Platelets: 250 10*3/uL (ref 150–400)
RBC: 5.83 MIL/uL — ABNORMAL HIGH (ref 3.87–5.11)
RDW: 13.6 % (ref 11.5–15.5)
WBC: 7 10*3/uL (ref 4.0–10.5)

## 2014-07-13 LAB — MAGNESIUM: Magnesium: 1.9 mg/dL (ref 1.7–2.4)

## 2014-07-13 LAB — GLUCOSE, CAPILLARY
GLUCOSE-CAPILLARY: 295 mg/dL — AB (ref 65–99)
GLUCOSE-CAPILLARY: 275 mg/dL — AB (ref 65–99)
GLUCOSE-CAPILLARY: 119 mg/dL — AB (ref 65–99)
Glucose-Capillary: 431 mg/dL — ABNORMAL HIGH (ref 65–99)
Glucose-Capillary: 280 mg/dL — ABNORMAL HIGH (ref 65–99)
Glucose-Capillary: 210 mg/dL — ABNORMAL HIGH (ref 65–99)
Glucose-Capillary: 293 mg/dL — ABNORMAL HIGH (ref 65–99)
Glucose-Capillary: 184 mg/dL — ABNORMAL HIGH (ref 65–99)

## 2014-07-13 LAB — PREGNANCY, URINE: Preg Test, Ur: NEGATIVE

## 2014-07-13 LAB — TROPONIN I

## 2014-07-13 LAB — TSH: TSH: 3.943 u[IU]/mL (ref 0.350–4.500)

## 2014-07-13 LAB — LIPASE, BLOOD: Lipase: 33 U/L (ref 22–51)

## 2014-07-13 MED ORDER — ENOXAPARIN SODIUM 80 MG/0.8ML ~~LOC~~ SOLN
75.0000 mg | SUBCUTANEOUS | Status: DC
  Administered 2014-07-13 – 2014-07-15 (×3): 75 mg via SUBCUTANEOUS
  Filled 2014-07-13 (×6): qty 0.8

## 2014-07-13 MED ORDER — SODIUM CHLORIDE 0.9 % IV SOLN
1000.0000 mL | INTRAVENOUS | Status: DC
  Administered 2014-07-13: 1000 mL via INTRAVENOUS

## 2014-07-13 MED ORDER — INSULIN REGULAR BOLUS VIA INFUSION
0.0000 [IU] | Freq: Three times a day (TID) | INTRAVENOUS | Status: DC
  Administered 2014-07-13: 6 [IU] via INTRAVENOUS
  Filled 2014-07-13: qty 10

## 2014-07-13 MED ORDER — SODIUM CHLORIDE 0.9 % IV SOLN
INTRAVENOUS | Status: DC
Start: 2014-07-13 — End: 2014-07-14
  Administered 2014-07-13: 14:00:00 via INTRAVENOUS

## 2014-07-13 MED ORDER — LISINOPRIL 10 MG PO TABS
10.0000 mg | ORAL_TABLET | Freq: Every day | ORAL | Status: DC
  Administered 2014-07-13 – 2014-07-16 (×4): 10 mg via ORAL
  Filled 2014-07-13 (×4): qty 1

## 2014-07-13 MED ORDER — SODIUM CHLORIDE 0.9 % IJ SOLN
3.0000 mL | Freq: Two times a day (BID) | INTRAMUSCULAR | Status: DC
  Administered 2014-07-13 – 2014-07-16 (×5): 3 mL via INTRAVENOUS

## 2014-07-13 MED ORDER — MAGIC MOUTHWASH: 10.0000 mL | Freq: Four times a day (QID) | ORAL | Status: DC | PRN

## 2014-07-13 MED ORDER — SODIUM CHLORIDE 0.9 % IV SOLN
1000.0000 mL | Freq: Once | INTRAVENOUS | Status: AC
  Administered 2014-07-13: 1000 mL via INTRAVENOUS

## 2014-07-13 MED ORDER — DIPHENHYDRAMINE HCL 25 MG PO CAPS: 25.0000 mg | ORAL_CAPSULE | Freq: Every evening | ORAL | Status: DC | PRN

## 2014-07-13 MED ORDER — LIVING WELL WITH DIABETES BOOK
Freq: Once | Status: AC
  Administered 2014-07-13: 14:00:00
  Filled 2014-07-13: qty 1

## 2014-07-13 MED ORDER — ACETAMINOPHEN 325 MG PO TABS
650.0000 mg | ORAL_TABLET | Freq: Four times a day (QID) | ORAL | Status: DC | PRN
  Administered 2014-07-13: 650 mg via ORAL
  Filled 2014-07-13: qty 2

## 2014-07-13 MED ORDER — DEXTROSE-NACL 5-0.45 % IV SOLN
INTRAVENOUS | Status: DC
  Administered 2014-07-13: 17:00:00 via INTRAVENOUS

## 2014-07-13 MED ORDER — ACETAMINOPHEN 500 MG PO TABS
500.0000 mg | ORAL_TABLET | Freq: Every evening | ORAL | Status: DC | PRN
  Administered 2014-07-14: 500 mg via ORAL
  Filled 2014-07-13: qty 1

## 2014-07-13 MED ORDER — ONDANSETRON HCL 4 MG PO TABS: 4.0000 mg | ORAL_TABLET | Freq: Four times a day (QID) | ORAL | Status: DC | PRN

## 2014-07-13 MED ORDER — ACETAMINOPHEN 650 MG RE SUPP: 650.0000 mg | Freq: Four times a day (QID) | RECTAL | Status: DC | PRN

## 2014-07-13 MED ORDER — ONDANSETRON HCL 4 MG/2ML IJ SOLN: 4.0000 mg | Freq: Four times a day (QID) | INTRAMUSCULAR | Status: DC | PRN

## 2014-07-13 MED ORDER — POLYETHYLENE GLYCOL 3350 17 G PO PACK: 17.0000 g | PACK | Freq: Every day | ORAL | Status: DC | PRN

## 2014-07-13 MED ORDER — BISACODYL 5 MG PO TBEC: 5.0000 mg | DELAYED_RELEASE_TABLET | Freq: Every day | ORAL | Status: DC | PRN

## 2014-07-13 MED ORDER — DEXTROSE 50 % IV SOLN: 25.0000 mL | INTRAVENOUS | Status: DC | PRN

## 2014-07-13 MED ORDER — SODIUM CHLORIDE 0.9 % IV SOLN
INTRAVENOUS | Status: DC | PRN
  Administered 2014-07-13: 12:00:00 via INTRAVENOUS
  Filled 2014-07-13: qty 2.5

## 2014-07-13 MED ORDER — FLUCONAZOLE 100 MG PO TABS
100.0000 mg | ORAL_TABLET | Freq: Every day | ORAL | Status: DC
  Administered 2014-07-13 – 2014-07-16 (×4): 100 mg via ORAL
  Filled 2014-07-13 (×4): qty 1

## 2014-07-13 MED ORDER — SODIUM CHLORIDE 0.9 % IV SOLN
INTRAVENOUS | Status: DC
  Administered 2014-07-13: 4.6 [IU]/h via INTRAVENOUS
  Administered 2014-07-13: 2.8 [IU]/h via INTRAVENOUS
  Administered 2014-07-13: 7.4 [IU]/h via INTRAVENOUS
  Administered 2014-07-14: 9.4 [IU]/h via INTRAVENOUS
  Administered 2014-07-14: 7.9 [IU]/h via INTRAVENOUS
  Administered 2014-07-14: 12.1 [IU]/h via INTRAVENOUS
  Administered 2014-07-14: 5.5 [IU]/h via INTRAVENOUS
  Administered 2014-07-14: 8.4 [IU]/h via INTRAVENOUS
  Administered 2014-07-14: 8.1 [IU]/h via INTRAVENOUS
  Filled 2014-07-13 (×2): qty 2.5

## 2014-07-13 MED ORDER — DIPHENHYDRAMINE-APAP (SLEEP) 25-500 MG PO TABS: 1.0000 | ORAL_TABLET | Freq: Every evening | ORAL | Status: DC | PRN

## 2014-07-13 MED ORDER — INSULIN ASPART 100 UNIT/ML ~~LOC~~ SOLN: 0.0000 [IU] | SUBCUTANEOUS | Status: DC

## 2014-07-13 NOTE — H&P (Signed)
[facility] History and Physical  Bruna Dills IOE:[id number] DOB: [birth year] DOA: 07/13/2014  Referring physician:  Arby Barrette PCP:  Georgann Housekeeper, MD   Chief Complaint:  Polyuria, polydipsia, blurry vision, weakness  HPI:  The patient is a 49 y.o. year-old female with history of morbid obesity and hypertension who presents with weakness, increased thirst and urination.  The patient was last at their baseline health until a few weeks ago when she developed progressive fatigue, blurry vision, increased thirstiness and increased frequency of urination.  Over the last few days, she has had some nausea and mild abdominal cramping.  She has had severe mouth soreness with redness and whiteness on her tongue that did not get better with vigorous brushing, and vulvar itching/burning with white discharge.  Today, she had such severe blurry vision and fatigue that she came to the ER.    In the ER, VSS.  Her labs were notable for blood glucose of 577 on BMP, normal pH, CO2 25 and normal anion gap.  Her hemoglobin 16.1.  She was given 1L IVF and started on MIVF and insulin gtt.  Most recent CBG 499.    Review of Systems:  General:  Denies fevers, chills HEENT:  Denies changes to hearing and vision, rhinorrhea, sinus congestion + sore mouth CV:  Denies chest pain and palpitations, lower extremity edema.  PULM:  Denies SOB, wheezing, cough.   GI:  Denies vomiting, constipation, diarrhea.   GU:  Denies dysuria, +  Frequency, + urgency ENDO:  + polyuria, polydipsia.   HEME:  Denies hematemesis, blood in stools, melena, abnormal bruising or bleeding.  LYMPH:  Denies lymphadenopathy.   MSK:  Denies arthralgias, myalgias.   DERM:  Denies skin rash or ulcer.   NEURO:  Denies focal numbness, weakness, slurred speech, confusion, facial droop.  Full body heaviness/weakness PSYCH:  Denies anxiety and depression.    Past Medical History  Diagnosis Date  . Hypertension   . Environmental allergies   .  Sciatica    Past Surgical History  Procedure Laterality Date  . Partial hysterectomy     Social History:  reports that she has been smoking.  She has never used smokeless tobacco. She reports that she does not drink alcohol or use illicit drugs.    Allergies  Allergen Reactions  . Aspirin Anaphylaxis  . Ibuprofen Anaphylaxis    Family History  Problem Relation Age of Onset  . High blood pressure Father   . High blood pressure Paternal Grandmother   . High blood pressure Paternal Aunt   . Diabetes Neg Hx   . Thyroid disease Neg Hx   . Lung cancer Mother   . Cancer Brother     congenital tumor     Prior to Admission medications   Medication Sig Start Date End Date Taking? Authorizing Provider  acetaminophen (TYLENOL) 500 MG tablet Take 1,000 mg by mouth every 6 (six) hours as needed. For pain   Yes Historical Provider, MD  albuterol (PROVENTIL HFA;VENTOLIN HFA) 108 (90 BASE) MCG/ACT inhaler Inhale 2 puffs into the lungs every 6 (six) hours as needed for wheezing or shortness of breath.   Yes Historical Provider, MD  amLODipine (NORVASC) 5 MG tablet Take 5 mg by mouth daily. 06/18/14  Yes Historical Provider, MD  cyclobenzaprine (FLEXERIL) 10 MG tablet Take 10 mg by mouth 3 (three) times daily as needed for muscle spasms.   Yes Historical Provider, MD  diphenhydramine-acetaminophen (TYLENOL PM) 25-500 MG TABS Take 1 tablet by mouth  at bedtime as needed (sleep).   Yes Historical Provider, MD  EPINEPHrine (EPI-PEN) 0.3 mg/0.3 mL DEVI Inject 0.3 mg into the muscle as needed. For allergic reaction 02/05/11  Yes Ruby Cola, PA-C  hydrochlorothiazide (HYDRODIURIL) 25 MG tablet Take 1 tablet (25 mg total) by mouth daily. 11/22/13  Yes Tiffany Neva Seat, PA-C  amoxicillin (AMOXIL) 500 MG capsule Take 1 capsule (500 mg total) by mouth 3 (three) times daily. Patient not taking: Reported on 07/13/2014 02/11/14   Marlon Pel, PA-C  oxyCODONE-acetaminophen (PERCOCET) 10-325 MG per tablet  Take 1 tablet by mouth every 4 (four) hours as needed for pain. Patient not taking: Reported on 07/13/2014 02/11/14   Marlon Pel, PA-C  oxyCODONE-acetaminophen (PERCOCET) 5-325 MG per tablet Take 1 tablet by mouth every 4 (four) hours as needed. Patient not taking: Reported on 07/13/2014 10/11/13   Mancel Bale, MD   Physical Exam: Filed Vitals:   07/13/14 1035 07/13/14 1100 07/13/14 1134 07/13/14 1213  BP:  121/62  151/87  Pulse: 84 82  85  Temp:    97.9 F (36.6 C)  TempSrc:    Oral  Resp: 22 20  20   Height:   5\' 9"  (1.753 m)   Weight:   154.223 kg (340 lb)   SpO2: 97% 96%  97%     General:  Obese female, NAD  Eyes:  PERRL, anicteric, non-injected.  ENT:  Nares clear.  OP with erythema of the soft palate, posterior palate, tongue with white plaques seen on the tongue.  Tacky mucous membranes.  Neck:  Supple without TM or JVD.    Lymph:  No cervical, supraclavicular, or submandibular LAD.  Cardiovascular:  RRR, normal S1, S2, without m/r/g.  2+ pulses, warm extremities  Respiratory:  CTA bilaterally without increased WOB.  Abdomen:  NABS.  Soft, ND/NT.    Skin:  No rashes or focal lesions.  Musculoskeletal:  Normal bulk and tone.  No LE edema.  Psychiatric:  A & O x 4.  Appropriate affect.  Neurologic:  CN 3-12 intact.  5/5 strength.  Sensation intact.  Labs on Admission:  Basic Metabolic Panel:  Recent Labs Lab 07/13/14 1021  NA 133*  K 3.9  CL 94*  CO2 25  GLUCOSE 577*  BUN 16  CREATININE 1.13*  CALCIUM 11.0*  MG 1.9   Liver Function Tests:  Recent Labs Lab 07/13/14 1021  AST 14*  ALT 11*  ALKPHOS 97  BILITOT 0.8  PROT 8.8*  ALBUMIN 4.5    Recent Labs Lab 07/13/14 1021  LIPASE 33   No results for input(s): AMMONIA in the last 168 hours. CBC:  Recent Labs Lab 07/13/14 1021  WBC 7.0  NEUTROABS 4.7  HGB 16.1*  HCT 48.2*  MCV 82.7  PLT 250   Cardiac Enzymes:  Recent Labs Lab 07/13/14 1021  TROPONINI <0.03    BNP (last  3 results) No results for input(s): BNP in the last 8760 hours.  ProBNP (last 3 results) No results for input(s): PROBNP in the last 8760 hours.  CBG:  Recent Labs Lab 07/13/14 0933 07/13/14 1129 07/13/14 1210  GLUCAP 547* 499* 463*    Radiological Exams on Admission: No results found.  EKG:  NSR with new RBBB.  Cycle troponins.    Assessment/Plan Principal Problem:   Uncontrolled type 2 diabetes mellitus with hyperosmolar nonketotic hyperglycemia Active Problems:   Essential hypertension   Morbid obesity with BMI of 50.0-59.9, adult   RBBB   Thrush, oral  ---  Diabetes  mellitus type 2, new onset -  Admit to stepdown -  Glucose stabilizer with q1h CBG -  Hemoglobin A1c pending -  Diabetic education by RN and diabetic educator -  Information about checking CBG and diabetic diet and health maintenance -  Troponin negative and TSH wnl.  No recent infections  Thrush and vaginal yeast infection -  Start Fluconazole and magic mouthwash  Pseudohyponatremia due to hyperglycemia -  Trend Na as glucose corrects  Hypertension, blood pressure wnl -  Hold HCTZ for now while rehydrating  -  D/c norvasc -  Start lisinopril -  Anticipate discharging on HCTZ-lisinopril combination pill  Dehydration with elevated hemoglobin and calcium -  Aggressive IVF  Morbid obesity Body mass index is 50.19 kg/(m^2).  -  Did not discuss at this time, but need to encourage weight loss  New RBBB -  Telemetry -  Troponin negative   Diet:  diabetic Access:  PIV IVF:  yes Proph:  lovenox  Code Status: full Family Communication: patient and her sister and brother Disposition Plan: Admit to stepdown  Time spent: 60 min Renae Fickle [facility] Pager [phone]  If 7PM-7AM, please contact night-coverage [email or web address] Password [email or web address] 07/13/2014, 1:05 PM

## 2014-07-13 NOTE — ED Notes (Signed)
Per pt, states increased weakness, polydipsia, frequent urination, burning with urination-states blurred vision, no history of DM

## 2014-07-13 NOTE — ED Notes (Signed)
POC [place] 2.58 reported to [employer]

## 2014-07-13 NOTE — Plan of Care (Signed)
Problem: Phase I Progression Outcomes Goal: Voiding-avoid urinary catheter unless indicated Outcome: Progressing oob using bsc

## 2014-07-13 NOTE — ED Notes (Signed)
Pfeiffer at bedside. 

## 2014-07-13 NOTE — Progress Notes (Signed)
  RD consulted for nutrition education regarding diabetes.   No results found for: HGBA1C  RD provided "Carbohydrate Counting for People with Diabetes" handout from the Academy of Nutrition and Dietetics. Discussed different food groups and their effects on blood sugar, emphasizing carbohydrate-containing foods. Provided list of carbohydrates and recommended serving sizes of common foods.  Discussed importance of controlled and consistent carbohydrate intake throughout the day. Provided examples of ways to balance meals/snacks and encouraged intake of high-fiber, whole grain complex carbohydrates, vegetables, and lean protein. Teach back method used.  Expect very good compliance.  Body mass index is 50.19 kg/(m^2). Pt meets criteria for Morbid Obesity based on current BMI.  Current diet order is Carb Modified/Heart Healthy, diet just advanced. Labs and medications reviewed. No further nutrition interventions warranted at this time. RD contact information provided. If additional nutrition issues arise, please re-consult RD.  Ian Malkin RD, LDN Inpatient Clinical Dietitian Pager: [phone] After Hours Pager: [phone]

## 2014-07-13 NOTE — ED Notes (Signed)
Bed: WA17 Expected date:  Expected time:  Means of arrival:  Comments: SOB 

## 2014-07-13 NOTE — ED Notes (Signed)
EKG shown to Dr. Donnald Garre, report given as well

## 2014-07-13 NOTE — ED Notes (Signed)
Pt reports increased urination, thirst, decreased appetite, and weakness for a week.

## 2014-07-13 NOTE — ED Provider Notes (Signed)
CSN: [id number]     Arrival date & time 07/13/14  4163 History   First MD Initiated Contact with Patient 07/13/14 [phone]     Chief Complaint  Patient presents with  . Weakness     (Consider location/radiation/quality/duration/timing/severity/associated sxs/prior Treatment) HPI Patient is only identifies symptoms as having developed over the past several days. She has been having increased frequency of urination, constant thirst and becoming increasingly weak. She states she has very little energy to do anything at this point. Her vision has been becoming blurry. She has also noted a painful rash with white spots on her tongue and vaginal itching with white discharge but not malodorous. No associated pelvic pain or abdominal pain. She states today she is feeling nauseated but has not had vomiting. The patient was not known to be diabetic. Past Medical History  Diagnosis Date  . Hypertension   . Environmental allergies   . Sciatica    Past Surgical History  Procedure Laterality Date  . Partial hysterectomy     No family history on file. History  Substance Use Topics  . Smoking status: Current Some Day Smoker  . Smokeless tobacco: Not on file  . Alcohol Use: No   OB History    No data available     Review of Systems 10 Systems reviewed and are negative for acute change except as noted in the HPI.    Allergies  Aspirin and Ibuprofen  Home Medications   Prior to Admission medications   Medication Sig Start Date End Date Taking? Authorizing Provider  acetaminophen (TYLENOL) 500 MG tablet Take 1,000 mg by mouth every 6 (six) hours as needed. For pain   Yes Historical Provider, MD  albuterol (PROVENTIL HFA;VENTOLIN HFA) 108 (90 BASE) MCG/ACT inhaler Inhale 2 puffs into the lungs every 6 (six) hours as needed for wheezing or shortness of breath.   Yes Historical Provider, MD  amLODipine (NORVASC) 5 MG tablet Take 5 mg by mouth daily. 06/18/14  Yes Historical Provider, MD   cyclobenzaprine (FLEXERIL) 10 MG tablet Take 10 mg by mouth 3 (three) times daily as needed for muscle spasms.   Yes Historical Provider, MD  diphenhydramine-acetaminophen (TYLENOL PM) 25-500 MG TABS Take 1 tablet by mouth at bedtime as needed (sleep).   Yes Historical Provider, MD  EPINEPHrine (EPI-PEN) 0.3 mg/0.3 mL DEVI Inject 0.3 mg into the muscle as needed. For allergic reaction 02/05/11  Yes Ruby Cola, PA-C  hydrochlorothiazide (HYDRODIURIL) 25 MG tablet Take 1 tablet (25 mg total) by mouth daily. 11/22/13  Yes Tiffany Neva Seat, PA-C  amoxicillin (AMOXIL) 500 MG capsule Take 1 capsule (500 mg total) by mouth 3 (three) times daily. Patient not taking: Reported on 07/13/2014 02/11/14   Marlon Pel, PA-C  oxyCODONE-acetaminophen (PERCOCET) 10-325 MG per tablet Take 1 tablet by mouth every 4 (four) hours as needed for pain. Patient not taking: Reported on 07/13/2014 02/11/14   Marlon Pel, PA-C  oxyCODONE-acetaminophen (PERCOCET) 5-325 MG per tablet Take 1 tablet by mouth every 4 (four) hours as needed. Patient not taking: Reported on 07/13/2014 10/11/13   Mancel Bale, MD   BP 121/62 mmHg  Pulse 82  Resp 20  Ht 5\' 9"  (1.753 m)  Wt 340 lb (154.223 kg)  BMI 50.19 kg/m2  SpO2 96% Physical Exam  Constitutional: She is oriented to person, place, and time. She appears well-developed and well-nourished.  Patient is morbidly obese. Her mental status is clear but she appears fatigued. No respiratory distress. Skin is warm and dry.  HENT:  Head: Normocephalic and atraumatic.  Nose: Nose normal.  Tongue has mild white plaque centrally and toward the front. This year or first is widely patent.  Eyes: EOM are normal. Pupils are equal, round, and reactive to light. Right eye exhibits no discharge. Left eye exhibits no discharge. No scleral icterus.  Neck: Neck supple.  Cardiovascular: Normal rate, regular rhythm, normal heart sounds and intact distal pulses.   Pulmonary/Chest: Effort  normal and breath sounds normal.  Abdominal: Soft. Bowel sounds are normal. She exhibits no distension. There is no tenderness.  Skin beneath the skin folds is clean and dry without intertriginous rash or candida.  Musculoskeletal: Normal range of motion. She exhibits no edema or tenderness.  No wounds or lesions to the feet or the lower legs.  Neurological: She is alert and oriented to person, place, and time. She has normal strength. She exhibits normal muscle tone. Coordination normal. GCS eye subscore is 4. GCS verbal subscore is 5. GCS motor subscore is 6.  Skin: Skin is warm, dry and intact.  Psychiatric: She has a normal mood and affect.    ED Course  Procedures (including critical care time) Labs Review Labs Reviewed  COMPREHENSIVE METABOLIC PANEL - Abnormal; Notable for the following:    Sodium 133 (*)    Chloride 94 (*)    Glucose, Bld 577 (*)    Creatinine, Ser 1.13 (*)    Calcium 11.0 (*)    Total Protein 8.8 (*)    AST 14 (*)    ALT 11 (*)    GFR calc non Af Amer 57 (*)    All other components within normal limits  CBC WITH DIFFERENTIAL/PLATELET - Abnormal; Notable for the following:    RBC 5.83 (*)    Hemoglobin 16.1 (*)    HCT 48.2 (*)    All other components within normal limits  URINALYSIS, ROUTINE W REFLEX MICROSCOPIC (NOT AT [facility]) - Abnormal; Notable for the following:    Specific Gravity, Urine 1.038 (*)    Glucose, UA >1000 (*)    Ketones, ur 15 (*)    All other components within normal limits  BLOOD GAS, VENOUS - Abnormal; Notable for the following:    pH, Ven 7.349 (*)    pCO2, Ven 51.6 (*)    Bicarbonate 27.7 (*)    All other components within normal limits  URINE MICROSCOPIC-ADD ON - Abnormal; Notable for the following:    Squamous Epithelial / LPF FEW (*)    All other components within normal limits  CBG MONITORING, ED - Abnormal; Notable for the following:    Glucose-Capillary 547 (*)    All other components within normal limits  CBG  MONITORING, ED - Abnormal; Notable for the following:    Glucose-Capillary 499 (*)    All other components within normal limits  I-STAT CG4 LACTIC ACID, ED - Abnormal; Notable for the following:    Lactic Acid, Venous 2.58 (*)    All other components within normal limits  LIPASE, BLOOD  TROPONIN I  PREGNANCY, URINE  MAGNESIUM  TSH  T3, FREE  HEMOGLOBIN A1C    Imaging Review No results found.   EKG Interpretation   Date/Time:  Friday July 13 2014 09:31:38 EDT Ventricular Rate:  94 PR Interval:  133 QRS Duration: 127 QT Interval:  486 QTC Calculation: 608 R Axis:   76 Text Interpretation:  Sinus rhythm Right bundle branch block ST elev,  probable normal early repol pattern agree Confirmed by Elis Rawlinson,  MD, Lebron Conners  [phone]) on 07/13/2014 9:36:51 AM     CRITICAL CARE Performed by: Arby Barrette   Total critical care time: 30  Critical care time was exclusive of separately billable procedures and treating other patients.  Critical care was necessary to treat or prevent imminent or life-threatening deterioration.  Critical care was time spent personally by me on the following activities: development of treatment plan with patient and/or surrogate as well as nursing, discussions with consultants, evaluation of patient's response to treatment, examination of patient, obtaining history from patient or surrogate, ordering and performing treatments and interventions, ordering and review of laboratory studies, ordering and review of radiographic studies, pulse oximetry and re-evaluation of patient's condition. MDM   Final diagnoses:  Uncontrolled type 2 diabetes mellitus with hyperosmolar nonketotic hyperglycemia   Patient has new onset of uncontrolled diabetes. She has significantly blurred vision, polydipsia and polyuria. The patient will be admitted on an insulin drip.   Arby Barrette, MD 07/13/14 1200

## 2014-07-13 NOTE — Progress Notes (Signed)
Inpatient Diabetes Program Recommendations  AACE/ADA: New Consensus Statement on Inpatient Glycemic Control (2013)  Target Ranges:  Prepandial:   less than 140 mg/dL      Peak postprandial:   less than 180 mg/dL (1-2 hours)      Critically ill patients:  140 - 180 mg/dL   Results for RIKO, LUMSDEN (MRN [id number]) as of 07/13/2014 14:19  Ref. Range 07/13/2014 10:21  Sodium Latest Ref Range: 135-145 mmol/L 133 (L)  Potassium Latest Ref Range: 3.5-5.1 mmol/L 3.9  Chloride Latest Ref Range: 101-111 mmol/L 94 (L)  CO2 Latest Ref Range: 22-32 mmol/L 25  BUN Latest Ref Range: 6-20 mg/dL 16  Creatinine Latest Ref Range: 0.44-1.00 mg/dL 1.13 (H)  Calcium Latest Ref Range: 8.9-10.3 mg/dL 11.0 (H)  EGFR (Non-African Amer.) Latest Ref Range: >60 mL/min 57 (L)  EGFR (African American) Latest Ref Range: >60 mL/min >60  Glucose Latest Ref Range: 65-99 mg/dL 577 (HH)  Anion gap Latest Ref Range: 5-15  14    Admit with: New Onset DM- Type 2  History: HTN, Obesity  Current DM Orders: IV insulin drip per [employer] with pt about new diagnosis.  Explained what an A1C is, basic pathophysiology of DM Type 2, basic home care, basic diabetes diet nutrition principles, importance of checking CBGs and maintaining good CBG control to prevent long-term and short-term complications.  Reviewed signs and symptoms of hyperglycemia and hypoglycemia and how to treat hypoglycemia at home.  Also reviewed blood sugar goals at home.    **RNs to provide ongoing basic DM education at bedside with this patient.  Have ordered educational booklet, CBG meter teaching, and DM videos.  Have also placed RD consult for DM diet education for this patient.  **Discussed with patient the various ways to control her blood sugar levels at home including medications (oral and insulin), nutrition, and exercise.  **Also discussed with patient that the results of her A1c test will help guide the physician when it's time to  decide on discharge medications.  **Outpatient DM education referral placed to the [facility] DM management center for further DM education after d/c.   Will follow Wyn Quaker RN, MSN, CDE Diabetes Coordinator Inpatient Glycemic Control Team Team Pager: [phone] (8a-5p)

## 2014-07-13 NOTE — ED Notes (Signed)
Unsuccessful IV attempt by this RN. Ikrame reports will update nurse unable to obtain 2nd IV. Short MD at bedside; transport post consult/assessment.

## 2014-07-14 ENCOUNTER — Inpatient Hospital Stay (HOSPITAL_COMMUNITY): Payer: 59

## 2014-07-14 LAB — GLUCOSE, CAPILLARY
GLUCOSE-CAPILLARY: 174 mg/dL — AB (ref 65–99)
Glucose-Capillary: 150 mg/dL — ABNORMAL HIGH (ref 65–99)
Glucose-Capillary: 155 mg/dL — ABNORMAL HIGH (ref 65–99)
Glucose-Capillary: 181 mg/dL — ABNORMAL HIGH (ref 65–99)
Glucose-Capillary: 184 mg/dL — ABNORMAL HIGH (ref 65–99)
Glucose-Capillary: 198 mg/dL — ABNORMAL HIGH (ref 65–99)
Glucose-Capillary: 199 mg/dL — ABNORMAL HIGH (ref 65–99)
Glucose-Capillary: 241 mg/dL — ABNORMAL HIGH (ref 65–99)
Glucose-Capillary: 257 mg/dL — ABNORMAL HIGH (ref 65–99)

## 2014-07-14 LAB — CBC
HEMATOCRIT: 43.4 % (ref 36.0–46.0)
Hemoglobin: 13.9 g/dL (ref 12.0–15.0)
MCH: 27 pg (ref 26.0–34.0)
MCHC: 32 g/dL (ref 30.0–36.0)
MCV: 84.3 fL (ref 78.0–100.0)
Platelets: 259 10*3/uL (ref 150–400)
RBC: 5.15 MIL/uL — ABNORMAL HIGH (ref 3.87–5.11)
RDW: 13.9 % (ref 11.5–15.5)
WBC: 8.6 10*3/uL (ref 4.0–10.5)

## 2014-07-14 LAB — BASIC METABOLIC PANEL
Anion gap: 9 (ref 5–15)
BUN: 15 mg/dL (ref 6–20)
CHLORIDE: 102 mmol/L (ref 101–111)
CO2: 27 mmol/L (ref 22–32)
Calcium: 10.6 mg/dL — ABNORMAL HIGH (ref 8.9–10.3)
Creatinine, Ser: 1.03 mg/dL — ABNORMAL HIGH (ref 0.44–1.00)
GFR calc non Af Amer: >60 mL/min (ref >60)
Glucose, Bld: 192 mg/dL — ABNORMAL HIGH (ref 65–99)
POTASSIUM: 3 mmol/L — AB (ref 3.5–5.1)
SODIUM: 138 mmol/L (ref 135–145)

## 2014-07-14 LAB — HEMOGLOBIN A1C
Hgb A1c MFr Bld: 11.4 % — ABNORMAL HIGH (ref 4.8–5.6)
MEAN PLASMA GLUCOSE: 280 mg/dL

## 2014-07-14 LAB — T3, FREE: T3 FREE: 2.9 pg/mL (ref 2.0–4.4)

## 2014-07-14 MED ORDER — INSULIN ASPART 100 UNIT/ML ~~LOC~~ SOLN
0.0000 [IU] | Freq: Three times a day (TID) | SUBCUTANEOUS | Status: DC
  Administered 2014-07-14: 15 [IU] via SUBCUTANEOUS
  Administered 2014-07-14: 2 [IU] via SUBCUTANEOUS

## 2014-07-14 MED ORDER — INSULIN ASPART 100 UNIT/ML ~~LOC~~ SOLN
12.0000 [IU] | Freq: Three times a day (TID) | SUBCUTANEOUS | Status: DC
  Administered 2014-07-14: 12 [IU] via SUBCUTANEOUS

## 2014-07-14 MED ORDER — INSULIN ASPART 100 UNIT/ML ~~LOC~~ SOLN: 16.0000 [IU] | Freq: Three times a day (TID) | SUBCUTANEOUS | Status: DC

## 2014-07-14 MED ORDER — INSULIN ASPART 100 UNIT/ML ~~LOC~~ SOLN: 12.0000 [IU] | Freq: Three times a day (TID) | SUBCUTANEOUS | Status: DC

## 2014-07-14 MED ORDER — POLYETHYLENE GLYCOL 3350 17 G PO PACK
17.0000 g | PACK | Freq: Every day | ORAL | Status: DC
  Administered 2014-07-16: 17 g via ORAL
  Filled 2014-07-14 (×3): qty 1

## 2014-07-14 MED ORDER — INSULIN ASPART 100 UNIT/ML ~~LOC~~ SOLN
0.0000 [IU] | Freq: Three times a day (TID) | SUBCUTANEOUS | Status: DC
  Administered 2014-07-14: 5 [IU] via SUBCUTANEOUS
  Administered 2014-07-15: 15 [IU] via SUBCUTANEOUS
  Administered 2014-07-15 (×2): 8 [IU] via SUBCUTANEOUS

## 2014-07-14 MED ORDER — INSULIN ASPART 100 UNIT/ML ~~LOC~~ SOLN
16.0000 [IU] | Freq: Three times a day (TID) | SUBCUTANEOUS | Status: DC
  Administered 2014-07-14: 16 [IU] via SUBCUTANEOUS

## 2014-07-14 MED ORDER — POTASSIUM CHLORIDE CRYS ER 20 MEQ PO TBCR
40.0000 meq | EXTENDED_RELEASE_TABLET | Freq: Once | ORAL | Status: AC
  Administered 2014-07-14: 40 meq via ORAL
  Filled 2014-07-14: qty 2

## 2014-07-14 MED ORDER — BUTALBITAL-APAP-CAFFEINE 50-325-40 MG PO TABS
1.0000 | ORAL_TABLET | Freq: Four times a day (QID) | ORAL | Status: DC | PRN
  Administered 2014-07-14: 1 via ORAL
  Filled 2014-07-14: qty 1

## 2014-07-14 MED ORDER — INSULIN ASPART 100 UNIT/ML ~~LOC~~ SOLN
8.0000 [IU] | Freq: Three times a day (TID) | SUBCUTANEOUS | Status: DC
  Administered 2014-07-14: 8 [IU] via SUBCUTANEOUS

## 2014-07-14 MED ORDER — INSULIN DETEMIR 100 UNIT/ML ~~LOC~~ SOLN
25.0000 [IU] | Freq: Two times a day (BID) | SUBCUTANEOUS | Status: DC
  Administered 2014-07-14 (×2): 25 [IU] via SUBCUTANEOUS
  Filled 2014-07-14 (×4): qty 0.25

## 2014-07-14 MED ORDER — MAGIC MOUTHWASH
10.0000 mL | Freq: Four times a day (QID) | ORAL | Status: DC
  Administered 2014-07-14 – 2014-07-16 (×7): 10 mL via ORAL
  Filled 2014-07-14 (×12): qty 10

## 2014-07-14 MED ORDER — INSULIN ASPART 100 UNIT/ML ~~LOC~~ SOLN
0.0000 [IU] | Freq: Every day | SUBCUTANEOUS | Status: DC
  Administered 2014-07-14 – 2014-07-15 (×2): 3 [IU] via SUBCUTANEOUS

## 2014-07-14 MED ORDER — INSULIN DETEMIR 100 UNIT/ML ~~LOC~~ SOLN
25.0000 [IU] | Freq: Two times a day (BID) | SUBCUTANEOUS | Status: DC
  Filled 2014-07-14: qty 0.25

## 2014-07-14 NOTE — Progress Notes (Addendum)
[facility] PROGRESS NOTE  Rebecca Avila SNK:[id number] DOB: [birth year] DOA: 07/13/2014 PCP: Georgann Housekeeper, MD  Brief Summary  The patient is a 49 y.o. year-old female with history of morbid obesity and hypertension who presents with weakness, increased thirst and urination. She developed progressive fatigue, blurry vision, increased thirstiness and increased frequency of urination a few weeks prior to admission. She developed nausea andabdominal cramps, severe mouth soreness, vulvar itching/burning, blurry vision and fatigue.  In the ER, her labs were notable for blood glucose of 577 on BMP, normal pH, CO2 25 and normal anion gap. She was started on glucose stabilizer and transitioned to subcutaneous insulin on 6/18.  Assessment/Plan  Diabetes mellitus type 2, new onset, CBG at goal for last several hours (140-180) - d/c Glucose stabilizer with q1h CBG -  Start at 0.7 units per kg =~ 100 units TDI, half long acting and half meal coverage -  Start levemir 25 units BID -  Meal insulin:  8 units (conservative since not eating normally) standing and mod dose SSI - Hemoglobin A1c 11.4 - Appreciate diabetic education by RN and diabetic educator - Troponin negative and TSH wnl. No recent infections  Thrush and vaginal yeast infection, improving - Continue fluconazole and schedule magic mouthwash (had not been receiving)  Pseudohyponatremia due to hyperglycemia, resolved.  Hypertension, blood pressure wnl - D/c norvasc and hold HCTZ - Started lisinopril 6/17  Dehydration with elevated hemoglobin and calcium, trending down with IVF - Aggressive IVF  Morbid obesity Body mass index is 50.19 kg/(m^2). - Did not discuss at this time, but need to encourage weight loss  New RBBB - Telemetry:  NSR, okay to D/C telemetry - Troponin negative   Hypokalemia due to urinary losses, poor oral intake -  Oral potassium repletion  Diet:  Diabetic/healthy heart Access:  PIV IVF:   off Proph:  lovenox  Code Status: full Family Communication: patient alone Disposition Plan: pending transitioned to subcutaneous insulin   Consultants:  Diabetic educator  Procedures:  CXR  Antibiotics:  Fluconazole 6/17 >>   HPI/Subjective:  Patient states that her blurry vision, more soreness, vulvar pain are all improving.  Still has some nausea without vomiting but was able to eat a little yesterday.  Has chronic intermittent numbness of the lateral aspect of the left leg and foot.  Objective: Filed Vitals:   07/13/14 2000 07/14/14 0000 07/14/14 0400 07/14/14 0541  BP: 147/97  125/85 127/82  Pulse: 84   95  Temp: 98.7 F (37.1 C) 98 F (36.7 C) 98.6 F (37 C)   TempSrc: Oral Oral Oral   Resp: 22   15  Height:      Weight:   149.7 kg (330 lb 0.5 oz)   SpO2: 94%  97% 97%    Intake/Output Summary (Last 24 hours) at 07/14/14 0751 Last data filed at 07/14/14 0700  Gross per 24 hour  Intake 2008.33 ml  Output    400 ml  Net 1608.33 ml   Filed Weights   07/13/14 1134 07/14/14 0400  Weight: 154.223 kg (340 lb) 149.7 kg (330 lb 0.5 oz)    Exam:   General:  Obese F, No acute distress  HEENT:  NCAT, MMM, tongue is less red but still has white plaques  Cardiovascular:  RRR, nl S1, S2 no mrg, 2+ pulses, warm extremities  Respiratory:  CTAB, no increased WOB  Abdomen:   NABS, soft, NT/ND  MSK:   Normal tone and bulk, no LEE  Neuro:  Grossly  intact, SITLT left lower extremity, strength 5/5   Data Reviewed: Basic Metabolic Panel:  Recent Labs Lab 07/13/14 1021 07/14/14 0407  NA 133* 138  K 3.9 3.0*  CL 94* 102  CO2 25 27  GLUCOSE 577* 192*  BUN 16 15  CREATININE 1.13* 1.03*  CALCIUM 11.0* 10.6*  MG 1.9  --    Liver Function Tests:  Recent Labs Lab 07/13/14 1021  AST 14*  ALT 11*  ALKPHOS 97  BILITOT 0.8  PROT 8.8*  ALBUMIN 4.5    Recent Labs Lab 07/13/14 1021  LIPASE 33   No results for input(s): AMMONIA in the last 168  hours. CBC:  Recent Labs Lab 07/13/14 1021 07/14/14 0407  WBC 7.0 8.6  NEUTROABS 4.7  --   HGB 16.1* 13.9  HCT 48.2* 43.4  MCV 82.7 84.3  PLT 250 259   Cardiac Enzymes:  Recent Labs Lab 07/13/14 1021  TROPONINI <0.03   BNP (last 3 results) No results for input(s): BNP in the last 8760 hours.  ProBNP (last 3 results) No results for input(s): PROBNP in the last 8760 hours.  CBG:  Recent Labs Lab 07/14/14 0151 07/14/14 0301 07/14/14 0404 07/14/14 0513 07/14/14 0627  GLUCAP 199* 184* 181* 198* 150*    Recent Results (from the past 240 hour(s))  MRSA PCR Screening     Status: None   Collection Time: 07/13/14  1:15 PM  Result Value Ref Range Status   MRSA by PCR NEGATIVE NEGATIVE Final    Comment:        The GeneXpert MRSA Assay (FDA approved for NASAL specimens only), is one component of a comprehensive MRSA colonization surveillance program. It is not intended to diagnose MRSA infection nor to guide or monitor treatment for MRSA infections.      Studies: No results found.  Scheduled Meds: . enoxaparin (LOVENOX) injection  75 mg Subcutaneous Q24H  . fluconazole  100 mg Oral Daily  . insulin aspart  0-15 Units Subcutaneous TID WC  . insulin aspart  0-5 Units Subcutaneous QHS  . insulin aspart  8 Units Subcutaneous TID WC  . insulin detemir  25 Units Subcutaneous BID  . lisinopril  10 mg Oral Daily  . magic mouthwash  10 mL Oral QID  . polyethylene glycol  17 g Oral Daily  . potassium chloride  40 mEq Oral Once  . sodium chloride  3 mL Intravenous Q12H   Continuous Infusions:   Principal Problem:   Uncontrolled type 2 diabetes mellitus with hyperosmolar nonketotic hyperglycemia Active Problems:   Essential hypertension   Morbid obesity with BMI of 50.0-59.9, adult   RBBB   Thrush, oral    Time spent: 30 min    [name], [facility]  [facility] Pager [phone]. If 7PM-7AM, please contact night-coverage at [email or web address], password  [facility] 07/14/2014, 7:51 AM  LOS: 1 day

## 2014-07-14 NOTE — Progress Notes (Signed)
Rebecca Avila 1232 TRANSFERRED TO 1526- WITH TRANSPORTER. NO CHANGE IN CONDITION. REPORT CALLED TO Jacki Cones RN.

## 2014-07-15 LAB — BASIC METABOLIC PANEL
Anion gap: 12 (ref 5–15)
BUN: 18 mg/dL (ref 6–20)
CO2: 22 mmol/L (ref 22–32)
Calcium: 10.6 mg/dL — ABNORMAL HIGH (ref 8.9–10.3)
Chloride: 101 mmol/L (ref 101–111)
Creatinine, Ser: 1.15 mg/dL — ABNORMAL HIGH (ref 0.44–1.00)
GFR calc Af Amer: >60 mL/min (ref >60)
GFR calc non Af Amer: 56 mL/min — ABNORMAL LOW (ref >60)
Glucose, Bld: 321 mg/dL — ABNORMAL HIGH (ref 65–99)
Potassium: 4 mmol/L (ref 3.5–5.1)
Sodium: 135 mmol/L (ref 135–145)

## 2014-07-15 LAB — GLUCOSE, CAPILLARY
GLUCOSE-CAPILLARY: 167 mg/dL — AB (ref 65–99)
GLUCOSE-CAPILLARY: 376 mg/dL — AB (ref 65–99)
Glucose-Capillary: 141 mg/dL — ABNORMAL HIGH (ref 65–99)
Glucose-Capillary: 360 mg/dL — ABNORMAL HIGH (ref 65–99)
Glucose-Capillary: 277 mg/dL — ABNORMAL HIGH (ref 65–99)
Glucose-Capillary: 263 mg/dL — ABNORMAL HIGH (ref 65–99)
Glucose-Capillary: 262 mg/dL — ABNORMAL HIGH (ref 65–99)

## 2014-07-15 MED ORDER — INSULIN DETEMIR 100 UNIT/ML ~~LOC~~ SOLN
35.0000 [IU] | Freq: Two times a day (BID) | SUBCUTANEOUS | Status: DC
  Administered 2014-07-15 (×2): 35 [IU] via SUBCUTANEOUS
  Filled 2014-07-15 (×3): qty 0.35

## 2014-07-15 MED ORDER — INSULIN ASPART 100 UNIT/ML ~~LOC~~ SOLN
25.0000 [IU] | Freq: Three times a day (TID) | SUBCUTANEOUS | Status: DC
  Administered 2014-07-15: 25 [IU] via SUBCUTANEOUS

## 2014-07-15 MED ORDER — LIVING WELL WITH DIABETES BOOK
Freq: Once | Status: AC
  Administered 2014-07-15: 1
  Filled 2014-07-15: qty 1

## 2014-07-15 MED ORDER — INSULIN ASPART 100 UNIT/ML ~~LOC~~ SOLN
20.0000 [IU] | Freq: Three times a day (TID) | SUBCUTANEOUS | Status: DC
  Administered 2014-07-15 (×2): 20 [IU] via SUBCUTANEOUS

## 2014-07-15 NOTE — Progress Notes (Signed)
[facility] PROGRESS NOTE  Rebecca Avila CWC:[id number] DOB: 1965-08-03 DOA: 07/13/2014 PCP: Georgann Housekeeper, MD  Brief Summary  The patient is a 49 y.o. year-old female with history of morbid obesity and hypertension who presents with weakness, increased thirst and urination. She developed progressive fatigue, blurry vision, increased thirstiness and increased frequency of urination a few weeks prior to admission. She developed nausea andabdominal cramps, severe mouth soreness, vulvar itching/burning, blurry vision and fatigue.  In the ER, her labs were notable for blood glucose of 577 on BMP, normal pH, CO2 25 and normal anion gap. She was started on glucose stabilizer and transitioned to subcutaneous insulin on 6/18.  Assessment/Plan  Diabetes mellitus type 2, new onset, CBG elevated -  I have been monitoring CBGs and adjusting insulin frequently throughout the day -  Levemir now 35 units BID -  Meal insulin:  25 units TID AC and mod dose SSI -  If CBG very elevated before bed and again in the morning, would increase by approximately 25% to 50 units BID levemir and 30 units with meals with low dose SSI.  If only mildly elevated, would increase by 15% to levemir 45 units BID and 30 units with meals - Hemoglobin A1c 11.4 - Appreciate diabetic education by RN and diabetic educator - Troponin negative and TSH wnl. No recent infections  Thrush and vaginal yeast infection, improving - Continue fluconazole and schedule magic mouthwash (had not been receiving)  Pseudohyponatremia due to hyperglycemia, resolved.  Hypertension, blood pressure wnl - D/c norvasc and HCTZ - Started lisinopril 6/17 and would titrate up as creatinine and potassium tolerate  Dehydration with elevated hemoglobin and calcium, trending down with IVF but potassium still elevated -  Check vitamin D, phos, PTH and PTHrp in AM  Morbid obesity Body mass index is 50.19 kg/(m^2). - Did not discuss at this time,  but need to encourage weight loss  New RBBB - Telemetry:  NSR, okay to D/C telemetry - Troponin negative   Hypokalemia due to urinary losses, poor oral intake, resolved with oral repletion  Diet:  Diabetic/healthy heart Access:  PIV IVF:  off Proph:  lovenox  Code Status: full Family Communication: patient alone Disposition Plan:  Likely d/c home tomorrow.  Needs to meet with nutritionist again tomorrow to reinforce diet   Consultants:  Diabetic educator  Procedures:  CXR  Antibiotics:  Fluconazole 6/17 >>   HPI/Subjective:  Blurry vision, mouth soreness, nausea all improved.  Eating well.  Was confused and ordered juice, fruit, and grits for breakfast today.    Objective: Filed Vitals:   07/14/14 1533 07/14/14 2127 07/15/14 0622 07/15/14 1132  BP: 114/76 126/87 134/83 136/95  Pulse: 79 88 79 76  Temp: 98 F (36.7 C) 97.9 F (36.6 C) 97.6 F (36.4 C)   TempSrc: Oral Oral Oral   Resp: 16 18 18    Height:      Weight:      SpO2: 97% 97% 98%     Intake/Output Summary (Last 24 hours) at 07/15/14 1735 Last data filed at 07/15/14 1732  Gross per 24 hour  Intake    600 ml  Output   1025 ml  Net   -425 ml   Filed Weights   07/13/14 1134 07/14/14 0400  Weight: 154.223 kg (340 lb) 149.7 kg (330 lb 0.5 oz)    Exam:   General:  Obese F, No acute distress  HEENT:  NCAT, MMM  Cardiovascular:  RRR, nl S1, S2 no mrg, 2+ pulses,  warm extremities  Respiratory:  CTAB, no increased WOB  Abdomen:   NABS, soft, NT/ND  MSK:   Normal tone and bulk, no LEE  Neuro:  Grossly moves all extremities.    Data Reviewed: Basic Metabolic Panel:  Recent Labs Lab 07/13/14 1021 07/14/14 0407 07/15/14 0559  NA 133* 138 135  K 3.9 3.0* 4.0  CL 94* 102 101  CO2 25 27 22   GLUCOSE 577* 192* 321*  BUN 16 15 18   CREATININE 1.13* 1.03* 1.15*  CALCIUM 11.0* 10.6* 10.6*  MG 1.9  --   --    Liver Function Tests:  Recent Labs Lab 07/13/14 1021  AST 14*  ALT 11*   ALKPHOS 97  BILITOT 0.8  PROT 8.8*  ALBUMIN 4.5    Recent Labs Lab 07/13/14 1021  LIPASE 33   No results for input(s): AMMONIA in the last 168 hours. CBC:  Recent Labs Lab 07/13/14 1021 07/14/14 0407  WBC 7.0 8.6  NEUTROABS 4.7  --   HGB 16.1* 13.9  HCT 48.2* 43.4  MCV 82.7 84.3  PLT 250 259   Cardiac Enzymes:  Recent Labs Lab 07/13/14 1021  TROPONINI <0.03   BNP (last 3 results) No results for input(s): BNP in the last 8760 hours.  ProBNP (last 3 results) No results for input(s): PROBNP in the last 8760 hours.  CBG:  Recent Labs Lab 07/14/14 1656 07/14/14 2124 07/15/14 0756 07/15/14 1201 07/15/14 1726  GLUCAP 241* 257* 376* 277* 263*    Recent Results (from the past 240 hour(s))  MRSA PCR Screening     Status: None   Collection Time: 07/13/14  1:15 PM  Result Value Ref Range Status   MRSA by PCR NEGATIVE NEGATIVE Final    Comment:        The GeneXpert MRSA Assay (FDA approved for NASAL specimens only), is one component of a comprehensive MRSA colonization surveillance program. It is not intended to diagnose MRSA infection nor to guide or monitor treatment for MRSA infections.      Studies: Dg Chest Port 1 View  07/14/2014   CLINICAL DATA:  Hypoxia  EXAM: PORTABLE CHEST - 1 VIEW  COMPARISON:  02/03/2013  FINDINGS: The heart size and mediastinal contours are within normal limits. Both lungs are clear. The visualized skeletal structures are unremarkable.  IMPRESSION: No active disease.   Electronically Signed   By: 07/16/2014 M.D.   On: 07/14/2014 12:39    Scheduled Meds: . enoxaparin (LOVENOX) injection  75 mg Subcutaneous Q24H  . fluconazole  100 mg Oral Daily  . insulin aspart  0-15 Units Subcutaneous TID WC  . insulin aspart  0-5 Units Subcutaneous QHS  . insulin aspart  25 Units Subcutaneous TID WC  . insulin detemir  35 Units Subcutaneous BID  . lisinopril  10 mg Oral Daily  . living well with diabetes book   Does not apply  Once  . magic mouthwash  10 mL Oral QID  . polyethylene glycol  17 g Oral Daily  . sodium chloride  3 mL Intravenous Q12H   Continuous Infusions:   Principal Problem:   Uncontrolled type 2 diabetes mellitus with hyperosmolar nonketotic hyperglycemia Active Problems:   Essential hypertension   Morbid obesity with BMI of 50.0-59.9, adult   RBBB   Thrush, oral    Time spent: 30 min    Latham Kinzler, [facility]  [facility] Pager [phone]. If 7PM-7AM, please contact night-coverage at [email or web address], password [facility] 07/15/2014, 5:35 PM  LOS: 2  days

## 2014-07-15 NOTE — Plan of Care (Signed)
Problem: Consults Goal: Nutrition Consult-if indicated Outcome: Progressing MD & pt said Nutritionist spoke with her Friday.  MD said she is planning follow-up visit to reinforce teaching prior to pt's d/c.  Problem: Phase I Progression Outcomes Goal: OOB as tolerated unless otherwise ordered Outcome: Progressing Encouraging pt to be up in chair and go for walks; mainly ambulating in room (independently).  Problem: Phase II Progression Outcomes Goal: Patient able to draw up & self administer Insulin Outcome: Progressing Pt able to administer insulin.  Will now progress to teaching her to draw up insulin.

## 2014-07-16 DIAGNOSIS — E210 Primary hyperparathyroidism: Secondary | ICD-10-CM

## 2014-07-16 DIAGNOSIS — Z6841 Body Mass Index (BMI) 40.0 and over, adult: Secondary | ICD-10-CM

## 2014-07-16 LAB — BASIC METABOLIC PANEL
Anion gap: 9 (ref 5–15)
BUN: 16 mg/dL (ref 6–20)
CALCIUM: 10.8 mg/dL — AB (ref 8.9–10.3)
CO2: 23 mmol/L (ref 22–32)
Chloride: 102 mmol/L (ref 101–111)
Creatinine, Ser: 0.92 mg/dL (ref 0.44–1.00)
GFR calc Af Amer: >60 mL/min (ref >60)
GFR calc non Af Amer: >60 mL/min (ref >60)
GLUCOSE: 232 mg/dL — AB (ref 65–99)
POTASSIUM: 4 mmol/L (ref 3.5–5.1)
SODIUM: 134 mmol/L — AB (ref 135–145)

## 2014-07-16 LAB — GLUCOSE, CAPILLARY
GLUCOSE-CAPILLARY: 286 mg/dL — AB (ref 65–99)
GLUCOSE-CAPILLARY: 342 mg/dL — AB (ref 65–99)
GLUCOSE-CAPILLARY: 306 mg/dL — AB (ref 65–99)

## 2014-07-16 MED ORDER — LISINOPRIL 10 MG PO TABS: 10.0000 mg | ORAL_TABLET | Freq: Every day | ORAL | Status: DC

## 2014-07-16 MED ORDER — INSULIN ASPART 100 UNIT/ML ~~LOC~~ SOLN
30.0000 [IU] | Freq: Three times a day (TID) | SUBCUTANEOUS | Status: DC
Start: 2014-07-16 — End: 2014-07-16
  Administered 2014-07-16 (×2): 30 [IU] via SUBCUTANEOUS

## 2014-07-16 MED ORDER — INSULIN DETEMIR 100 UNIT/ML ~~LOC~~ SOLN
50.0000 [IU] | Freq: Two times a day (BID) | SUBCUTANEOUS | Status: DC
  Administered 2014-07-16: 50 [IU] via SUBCUTANEOUS
  Filled 2014-07-16 (×2): qty 0.5

## 2014-07-16 MED ORDER — FLUCONAZOLE 100 MG PO TABS: 100.0000 mg | ORAL_TABLET | Freq: Every day | ORAL | Status: DC

## 2014-07-16 MED ORDER — INSULIN ASPART 100 UNIT/ML ~~LOC~~ SOLN
0.0000 [IU] | Freq: Three times a day (TID) | SUBCUTANEOUS | Status: DC
  Administered 2014-07-16: 5 [IU] via SUBCUTANEOUS
  Administered 2014-07-16: 7 [IU] via SUBCUTANEOUS

## 2014-07-16 MED ORDER — INSULIN ASPART 100 UNIT/ML ~~LOC~~ SOLN: 30.0000 [IU] | Freq: Three times a day (TID) | SUBCUTANEOUS | Status: DC

## 2014-07-16 MED ORDER — "INSULIN SYRINGE 30G X 1/2"" 0.5 ML MISC": Status: DC

## 2014-07-16 MED ORDER — INSULIN ASPART 100 UNIT/ML ~~LOC~~ SOLN: 0.0000 [IU] | Freq: Every day | SUBCUTANEOUS | Status: DC

## 2014-07-16 MED ORDER — INSULIN STARTER KIT- SYRINGES (ENGLISH)
1.0000 | Freq: Once | Status: AC
  Administered 2014-07-16: 1
  Filled 2014-07-16: qty 1

## 2014-07-16 MED ORDER — BLOOD GLUCOSE METER KIT: PACK | Status: DC

## 2014-07-16 MED ORDER — INSULIN DETEMIR 100 UNIT/ML ~~LOC~~ SOLN: 50.0000 [IU] | Freq: Two times a day (BID) | SUBCUTANEOUS | Status: DC

## 2014-07-16 NOTE — Progress Notes (Signed)
NUTRITION NOTE  New consult to review diabetic diet once more before pt d/c today. Pt reports that "at this point I think I am overeducated. I feel that everyone has done a great job and I know how to inject and eat right."  Pt denies any needs or having any questions at this time.    Trenton Gammon, RD, LDN Inpatient Clinical Dietitian Pager # [phone] After hours/weekend pager # [phone]

## 2014-07-16 NOTE — Progress Notes (Addendum)
Inpatient Diabetes Program Recommendations  AACE/ADA: New Consensus Statement on Inpatient Glycemic Control (2013)  Target Ranges:  Prepandial:   less than 140 mg/dL      Peak postprandial:   less than 180 mg/dL (1-2 hours)      Critically ill patients:  140 - 180 mg/dL   Reason for Visit: F/U regarding new-onset DM  Pt states she feels comfortable checking blood sugars and drawing up, injecting insulin. Discussed hypoglycemia s/s and treatment. Pt appears very motivated to make lifestyle changes in diet and exercise. Will f/u with PCP within 1 week with blood sugar log. Discussed portion control and importance of choosing high fiber foods for better glucose control. Answered questions and pt voiced good understanding.  Recommendations for discharge: Pt will be using vial and syringe Levemir 50 units bid Novolog 30 units tidwc for meal coverage (if pt doesn't eat regular meal, do not give) Will need prescription for meter, lancets and strips. Order # [id number] Syringes - 1cc prescription  Thank you.Thank you. Ailene Ards, RD, LDN, CDE Inpatient Diabetes Coordinator [phone]

## 2014-07-16 NOTE — Progress Notes (Signed)
Discharge instructions given to patient along with prescriptions.  Questions answered 

## 2014-07-16 NOTE — Discharge Summary (Signed)
Physician Discharge Summary  Rebecca Avila ZOX:[id number] DOB: [birth year] DOA: 07/13/2014  PCP: Wenda Low, MD  Admit date: 07/13/2014 Discharge date: 07/16/2014  Recommendations for Outpatient Follow-up:  1. Follow-up with primary care doctor within 1 week of discharge to review CBGs and to start routine maintenance for diabetes including referral to ophthalmology.  She will also need urine protein evaluation, and testing for neuropathy. 2. Please repeat BMP to follow-up calcium and creatinine and potassium  3. Please follow-up pending studies for hypercalcemia 4. Patient given referral for outpatient diabetes education  Discharge Diagnoses:  Principal Problem:   Uncontrolled type 2 diabetes mellitus with hyperosmolar nonketotic hyperglycemia Active Problems:   Essential hypertension   Morbid obesity with BMI of 50.0-59.9, adult   RBBB   Thrush, oral   Hypercalcemia   Discharge Condition: Stable, improved   Diet recommendation: Diabetic diet   Wt Readings from Last 3 Encounters:  07/14/14 149.7 kg (330 lb 0.5 oz)  02/03/13 162.524 kg (358 lb 4.8 oz)  05/21/11 142.883 kg (315 lb)    History of present illness:  The patient is a 49 y.o. year-old female with history of morbid obesity and hypertension who presents with weakness, increased thirst and urination. She developed progressive fatigue, blurry vision, increased thirstiness and increased frequency of urination a few weeks prior to admission. She developed nausea andabdominal cramps, severe mouth soreness, vulvar itching/burning, blurry vision and fatigue. In the ER, her labs were notable for blood glucose of 577 on BMP, normal pH, CO2 25 and normal anion gap. She was started on glucose stabilizer and transitioned to subcutaneous insulin on 6/18.  Her subcutaneous insulin doses have been adjusted, however she will need close follow-up to make further changes in the next week.  Hospital Course:   Diabetes mellitus type 2,  new onset, CBG elevated  - Levemir 50 units BID - Meal insulin: 30 units TID AC - Hemoglobin A1c 11.4 - Appreciate diabetic education by RN and diabetic educator - Troponin negative and TSH wnl. No recent infections  Thrush and vaginal yeast infection, improving - Continue fluconazole for a total of 7 days  Pseudohyponatremia due to hyperglycemia, resolved.  Hypertension, blood pressure wnl - D/c'd norvasc and HCTZ - Started lisinopril 6/17 and would titrate up as creatinine and potassium tolerate  Dehydration with elevated hemoglobin and calcium, trending down with IVF but potassium still elevated - f/u vitamin D, phos, PTH and PTHrp which are pending at the time of discharge  Morbid obesity Body mass index is 50.19 kg/(m^2). - Did not discuss at this time, but need to encourage weight loss  New RBBB - Telemetry: NSR, okay to D/C telemetry - Troponin negative   Hypokalemia due to urinary losses, poor oral intake, resolved with oral repletion  Consultants:  Diabetic educator  Procedures:  CXR  Antibiotics:  Fluconazole 6/17 >>  Discharge Exam: Filed Vitals:   07/16/14 0545  BP: 129/86  Pulse: 79  Temp: 97.7 F (36.5 C)  Resp: 18   Filed Vitals:   07/15/14 1132 07/15/14 1844 07/15/14 2145 07/16/14 0545  BP: 136/95  145/89 129/86  Pulse: 76  82 79  Temp:  97.7 F (36.5 C) 98.1 F (36.7 C) 97.7 F (36.5 C)  TempSrc:  Oral Oral Oral  Resp:   18 18  Height:      Weight:      SpO2:   100% 98%     General: Obese F, No acute distress  HEENT: NCAT, MMM  Cardiovascular: RRR, nl S1,  S2 no mrg, 2+ pulses, warm extremities  Respiratory: CTAB, no increased WOB  Abdomen: NABS, soft, NT/ND  MSK: Normal tone and bulk, no LEE  Neuro: Grossly moves all extremities.  Discharge Instructions      Discharge Instructions    Ambulatory referral to Nutrition and Diabetic Education    Complete by:  As directed   New diagnosis of DM-  Type 2.  A1c pending.  PCP: Dr. Benita Stabile.    Patient: Please call the [facility] after discharge to schedule an appointment for diabetes education if you do not hear from the center before discharge  [phone]     Call MD for:  difficulty breathing, headache or visual disturbances    Complete by:  As directed      Call MD for:  extreme fatigue    Complete by:  As directed      Call MD for:  hives    Complete by:  As directed      Call MD for:  persistant dizziness or light-headedness    Complete by:  As directed      Call MD for:  persistant nausea and vomiting    Complete by:  As directed      Call MD for:  severe uncontrolled pain    Complete by:  As directed      Call MD for:  temperature >100.4    Complete by:  As directed      Diet Carb Modified    Complete by:  As directed      Discharge instructions    Complete by:  As directed   Please check your blood sugars four times a day, before breakfast, lunch, dinner, and bed.  Record the numbers and bring them with you to your primary care doctor's appointment in approximately 1 week.  If your blood sugars are persistently greater than 350, please call your doctor's office sooner to get instructions with adjusting your insulin even before your follow up for an appointment.  Please take levemir (long acting) insulin 50 units twice a day.  Please take aspart (Matheo Rathbone acting meal insulin) 30 units before meals.  Do NOT take your aspart if you do not or cannot eat a meal at that time.     Increase activity slowly    Complete by:  As directed             Medication List    STOP taking these medications        amLODipine 5 MG tablet  Commonly known as:  NORVASC     hydrochlorothiazide 25 MG tablet  Commonly known as:  HYDRODIURIL      TAKE these medications        acetaminophen 500 MG tablet  Commonly known as:  TYLENOL  Take 1,000 mg by mouth every 6 (six) hours as needed. For  pain     blood glucose meter kit and supplies  Dispense based on patient and insurance preference. Use up to four times daily as directed. (FOR ICD-9 250.00, 250.01).     diphenhydramine-acetaminophen 25-500 MG Tabs  Commonly known as:  TYLENOL PM  Take 1 tablet by mouth at bedtime as needed (sleep).     EPINEPHrine 0.3 mg/0.3 mL Devi  Commonly known as:  EPI-PEN  Inject 0.3 mg into the muscle as needed. For allergic reaction     fluconazole 100 MG tablet  Commonly known as:  DIFLUCAN  Take 1 tablet (  100 mg total) by mouth daily.     insulin aspart 100 UNIT/ML injection  Commonly known as:  NOVOLOG  Inject 30 Units into the skin 3 (three) times daily with meals.     insulin detemir 100 UNIT/ML injection  Commonly known as:  LEVEMIR  Inject 0.5 mLs (50 Units total) into the skin 2 (two) times daily.     INSULIN SYRINGE .5CC/30GX1/2" 30G X 1/2" 0.5 ML Misc  Use with levemir BID and aspart TID     lisinopril 10 MG tablet  Commonly known as:  PRINIVIL,ZESTRIL  Take 1 tablet (10 mg total) by mouth daily.       Follow-up Information    Follow up with Wenda Low, MD. Schedule an appointment as soon as possible for a visit in 1 week.   Specialty:  Internal Medicine   Contact information:   [address] [place] [address] [phone]        The results of significant diagnostics from this hospitalization (including imaging, microbiology, ancillary and laboratory) are listed below for reference.    Significant Diagnostic Studies: Dg Chest Port 1 View  07/14/2014   CLINICAL DATA:  Hypoxia  EXAM: PORTABLE CHEST - 1 VIEW  COMPARISON:  02/03/2013  FINDINGS: The heart size and mediastinal contours are within normal limits. Both lungs are clear. The visualized skeletal structures are unremarkable.  IMPRESSION: No active disease.   Electronically Signed   By: Conchita Paris M.D.   On: 07/14/2014 12:39    Microbiology: Recent Results (from the past 240  hour(s))  MRSA PCR Screening     Status: None   Collection Time: 07/13/14  1:15 PM  Result Value Ref Range Status   MRSA by PCR NEGATIVE NEGATIVE Final    Comment:        The GeneXpert MRSA Assay (FDA approved for NASAL specimens only), is one component of a comprehensive MRSA colonization surveillance program. It is not intended to diagnose MRSA infection nor to guide or monitor treatment for MRSA infections.      Labs: Basic Metabolic Panel:  Recent Labs Lab 07/13/14 1021 07/14/14 0407 07/15/14 0559 07/16/14 0510  NA 133* 138 135 134*  K 3.9 3.0* 4.0 4.0  CL 94* 102 101 102  CO2 _0 GLUCOSE 577* 192* 321* 232*  BUN _1 CREATININE 1.13* 1.03* 1.15* 0.92  CALCIUM 11.0* 10.6* 10.6* 10.8*  MG 1.9  --   --   --    Liver Function Tests:  Recent Labs Lab 07/13/14 1021  AST 14*  ALT 11*  ALKPHOS 97  BILITOT 0.8  PROT 8.8*  ALBUMIN 4.5    Recent Labs Lab 07/13/14 1021  LIPASE 33   No results for input(s): AMMONIA in the last 168 hours. CBC:  Recent Labs Lab 07/13/14 1021 07/14/14 0407  WBC 7.0 8.6  NEUTROABS 4.7  --   HGB 16.1* 13.9  HCT 48.2* 43.4  MCV 82.7 84.3  PLT 250 259   Cardiac Enzymes:  Recent Labs Lab 07/13/14 1021  TROPONINI <0.03   BNP: BNP (last 3 results) No results for input(s): BNP in the last 8760 hours.  ProBNP (last 3 results) No results for input(s): PROBNP in the last 8760 hours.  CBG:  Recent Labs Lab 07/15/14 1726 07/15/14 2150 07/16/14 0813 07/16/14 0954 07/16/14 1155  GLUCAP 263* 262* 286* 342* 306*    Time coordinating discharge: 35 minutes  Signed:  Krystelle Prashad  [facility] 07/16/2014, 1:15 PM

## 2014-07-23 ENCOUNTER — Emergency Department (HOSPITAL_COMMUNITY): Payer: 59

## 2014-07-23 ENCOUNTER — Encounter (HOSPITAL_COMMUNITY): Payer: Self-pay | Admitting: *Deleted

## 2014-07-23 ENCOUNTER — Emergency Department (HOSPITAL_COMMUNITY)
Admission: EM | Admit: 2014-07-23 | Discharge: 2014-07-23 | Disposition: A | Discharge status: Home / Self Care | Payer: 59 | Attending: Emergency Medicine | Admitting: Emergency Medicine

## 2014-07-23 DIAGNOSIS — R002 Palpitations: Secondary | ICD-10-CM | POA: Insufficient documentation

## 2014-07-23 DIAGNOSIS — E669 Obesity, unspecified: Secondary | ICD-10-CM | POA: Insufficient documentation

## 2014-07-23 DIAGNOSIS — Z720 Tobacco use: Secondary | ICD-10-CM | POA: Diagnosis not present

## 2014-07-23 DIAGNOSIS — I10 Essential (primary) hypertension: Secondary | ICD-10-CM | POA: Insufficient documentation

## 2014-07-23 DIAGNOSIS — R51 Headache: Secondary | ICD-10-CM | POA: Insufficient documentation

## 2014-07-23 DIAGNOSIS — Z8739 Personal history of other diseases of the musculoskeletal system and connective tissue: Secondary | ICD-10-CM | POA: Insufficient documentation

## 2014-07-23 DIAGNOSIS — Z794 Long term (current) use of insulin: Secondary | ICD-10-CM | POA: Diagnosis not present

## 2014-07-23 DIAGNOSIS — E119 Type 2 diabetes mellitus without complications: Secondary | ICD-10-CM | POA: Diagnosis not present

## 2014-07-23 DIAGNOSIS — Z79899 Other long term (current) drug therapy: Secondary | ICD-10-CM | POA: Diagnosis not present

## 2014-07-23 DIAGNOSIS — H538 Other visual disturbances: Secondary | ICD-10-CM | POA: Insufficient documentation

## 2014-07-23 HISTORY — DX: Type 2 diabetes mellitus without complications: E11.9

## 2014-07-23 LAB — URINALYSIS, ROUTINE W REFLEX MICROSCOPIC
BILIRUBIN URINE: NEGATIVE
Glucose, UA: NEGATIVE mg/dL
Hgb urine dipstick: NEGATIVE
Ketones, ur: NEGATIVE mg/dL
Leukocytes, UA: NEGATIVE
NITRITE: NEGATIVE
PH: 6 (ref 5.0–8.0)
PROTEIN: NEGATIVE mg/dL
Specific Gravity, Urine: 1.012 (ref 1.005–1.030)
Urobilinogen, UA: 0.2 mg/dL (ref 0.0–1.0)

## 2014-07-23 LAB — BASIC METABOLIC PANEL
ANION GAP: 8 (ref 5–15)
BUN: 13 mg/dL (ref 6–20)
CALCIUM: 10.3 mg/dL (ref 8.9–10.3)
CO2: 24 mmol/L (ref 22–32)
Chloride: 105 mmol/L (ref 101–111)
Creatinine, Ser: 0.92 mg/dL (ref 0.44–1.00)
GFR calc Af Amer: >60 mL/min (ref >60)
GFR calc non Af Amer: >60 mL/min (ref >60)
Glucose, Bld: 118 mg/dL — ABNORMAL HIGH (ref 65–99)
Potassium: 4.2 mmol/L (ref 3.5–5.1)
SODIUM: 137 mmol/L (ref 135–145)

## 2014-07-23 LAB — I-STAT TROPONIN, ED: TROPONIN I, POC: 0.01 ng/mL (ref 0.00–0.08)

## 2014-07-23 LAB — CBC
HEMATOCRIT: 41.5 % (ref 36.0–46.0)
Hemoglobin: 13.1 g/dL (ref 12.0–15.0)
MCH: 26.9 pg (ref 26.0–34.0)
MCHC: 31.6 g/dL (ref 30.0–36.0)
MCV: 85.2 fL (ref 78.0–100.0)
PLATELETS: 242 10*3/uL (ref 150–400)
RBC: 4.87 MIL/uL (ref 3.87–5.11)
RDW: 14.3 % (ref 11.5–15.5)
WBC: 5.5 10*3/uL (ref 4.0–10.5)

## 2014-07-23 LAB — CBG MONITORING, ED: Glucose-Capillary: 145 mg/dL — ABNORMAL HIGH (ref 65–99)

## 2014-07-23 MED ORDER — INSULIN ASPART 100 UNIT/ML FLEXPEN: 30.0000 [IU] | PEN_INJECTOR | Freq: Three times a day (TID) | SUBCUTANEOUS | Status: DC

## 2014-07-23 NOTE — Discharge Instructions (Signed)
Blurred Vision You have been seen today complaining of blurred vision. This means you have a loss of ability to see small details.  CAUSES  Blurred vision can be a symptom of underlying eye problems, such as:  Aging of the eye (presbyopia).  Glaucoma.  Cataracts.  Eye infection.  Eye-related migraine.  Diabetes mellitus.  Fatigue.  Migraine headaches.  High blood pressure.  Breakdown of the back of the eye (macular degeneration).  Problems caused by some medications. The most common cause of blurred vision is the need for eyeglasses or a new prescription. Today in the emergency department, no cause for your blurred vision can be found. SYMPTOMS  Blurred vision is the loss of visual sharpness and detail (acuity). DIAGNOSIS  Should blurred vision continue, you should see your caregiver. If your caregiver is your primary care physician, he or she may choose to refer you to another specialist.  TREATMENT  Do not ignore your blurred vision. Make sure to have it checked out to see if further treatment or referral is necessary. SEEK MEDICAL CARE IF:  You are unable to get into a specialist so we can help you with a referral. SEEK IMMEDIATE MEDICAL CARE IF: You have severe eye pain, severe headache, or sudden loss of vision. MAKE SURE YOU:   Understand these instructions.  Will watch your condition.  Will get help right away if you are not doing well or get worse. Document Released: 01/15/2003 Document Revised: 04/06/2011 Document Reviewed: 08/17/2007 [facility] Patient Information 2015 [place], [place]. This information is not intended to replace advice given to you by your health care provider. Make sure you discuss any questions you have with your health care provider.   Emergency Department Resource Guide 1) Find a Doctor and Pay Out of Pocket Although you won't have to find out who is covered by your insurance plan, it is a good idea to ask around and get recommendations.  You will then need to call the office and see if the doctor you have chosen will accept you as a new patient and what types of options they offer for patients who are self-pay. Some doctors offer discounts or will set up payment plans for their patients who do not have insurance, but you will need to ask so you aren't surprised when you get to your appointment.  2) Contact Your Local Health Department Not all health departments have doctors that can see patients for sick visits, but many do, so it is worth a call to see if yours does. If you don't know where your local health department is, you can check in your phone book. The CDC also has a tool to help you locate your state's health department, and many state websites also have listings of all of their local health departments.  3) Find a Walk-in Clinic If your illness is not likely to be very severe or complicated, you may want to try a walk in clinic. These are popping up all over the country in pharmacies, drugstores, and shopping centers. They're usually staffed by nurse practitioners or physician assistants that have been trained to treat common illnesses and complaints. They're usually fairly quick and inexpensive. However, if you have serious medical issues or chronic medical problems, these are probably not your best option.  No Primary Care Doctor: - Call Health Connect at  [phone] - they can help you locate a primary care doctor that  accepts your insurance, provides certain services, etc. - Physician Referral Service- [phone]  Chronic Pain Problems: °Organization         Address  Phone   Notes  °[facility]  ([phone]) [phone] Patients need to be referred by their primary care doctor.  ° °Medication Assistance: °Organization         Address  Phone   Notes  °[place] [facility] [address] °[place], [place] [address] (336) 641-8030 --Must be a resident of [place] °--  Must have NO insurance coverage whatsoever (no Medicaid/ Medicare, etc.) °-- The pt. MUST have a primary care doctor that directs their care regularly and follows them in the community °  °[facility]  (866) 331-1348   °[employer]  (888) 892-1162   ° °Agencies that provide inexpensive medical care: °Organization         Address  Phone   Notes  °[facility]  ([phone]) [phone]   °[facility]    (336) 832-7272   °[facility] [address] °[place], [place] [address] (336) 832-4777   °[facility] [address], °[place] (336) 271-4999   °[facility]    (336) 373-0678   °[facility]    (336) 272-1050   °[facility] ° [address], [place] Phone:  (336) 832-4444, Fax:  (336) 832-4440 Hours of Operation:  9 am - 6 pm, M-F.  Also accepts Medicaid/Medicare and self-pay.  °[facility] ° [address], [place] Phone: (336) [phone], Fax: (336) [phone]. Hours of Operation:  8:30 am - 5:30 pm, M-F.  Also accepts Medicaid and self-pay.  °[facility] [place] [address], [place] Phone: (336) 878-6027   °[facility] [address], [place], [place] (336)723-1848, Ext. 123 Mondays & Thursdays: 7-9 AM.  First 15 patients are seen on a first come, first serve basis. °  ° °Medicaid-accepting [place] Providers: ° °Organization         Address  Phone   Notes  °[facility] [address], [place] ([phone]) [phone] Also accepts self-pay patients.  °[facility] [address], [place] ° (336) [phone]   °[facility] [address], [place] (336) 288-8857   °[facility] [address] [place] [address], [place] (336) 299-7000   °Veita Bland [address], [place]  ° (336) [phone] Only accepts [place] Access Medicaid patients  after they have their name applied to their card.  ° °Self-Pay (no insurance) in [place]: ° °Organization         Address  Phone   Notes  °Sickle Cell Patients, [facility] [address], [place] (336) 832-1970   °[facility] [address], [place] (336) 832-4400   °[facility] ° [address], [place] (336) 992-4800   °[facility]/Dr. Osei-Bonsu ° [address] [place] [address], [place] or [address], [place] (336) [phone] Phone number for both [place] and [place] locations is the same.  °[facility] [address], [place] (336) [phone]   °[facility] [address] [place] [address], [place] or [address] (336) 852-7530 °(336) 878-2260   °[facility] [address],  (336) 350-1642, phone; (336) 294-5005, fax Sees   patients 1st and 3rd Saturday of every month.  Must not qualify for public or private insurance (i.e. Medicaid, Medicare, [place] Health Choice, Veterans' Benefits)  Household income should be no more than 200% of the poverty level The clinic cannot treat you if you are pregnant or think you are pregnant  Sexually transmitted diseases are not treated at the clinic.    Dental Care: Organization         Address  Phone  Notes  [facility] of [facility] [address] [place], [place] [phone] Accepts children up to age 40 who are enrolled in [place] or [place] Health Choice; pregnant women with a Medicaid card; and children who have applied for Medicaid or [place] Health Choice, but were declined, whose parents can pay a reduced fee at time of service.  [facility] of [facility]  [address], [place] [phone] Accepts children up to age 25 who are enrolled in [place] or [place] Health Choice; pregnant women with a Medicaid card; and children  who have applied for Medicaid or [place] Health Choice, but were declined, whose parents can pay a reduced fee at time of service.  [facility]  [address] [place], [place] [phone] Patients are seen by appointment only. Walk-ins are not accepted. [facility] will see patients 55 years of age and older. Monday - Tuesday (8am-5pm) Most Wednesdays (8:30-5pm) $30 per visit, cash only  [facility]  [address], [facility] [phone] Patients are seen by appointment only. Walk-ins are not accepted. [facility] will see patients 5 years of age and older. One Wednesday Evening (Monthly: Volunteer Based).  $30 per visit, cash only  [employer]  860-613-7735 for adults; Children under age 44, call Graduate Pediatric Dentistry at ([phone]. Children aged 52-14, please call [phone]) [phone] to request a pediatric application.  Dental services are provided in all areas of dental care including fillings, crowns and bridges, complete and partial dentures, implants, gum treatment, root canals, and extractions. Preventive care is also provided. Treatment is provided to both adults and children. Patients are selected via a lottery and there is often a waiting list.   [facility] [address], [place]  786-586-7232 [email or web address]   [facility] [address] [place], [place] [phone], Ext. 123 Second and Fourth Thursday of each month, opens at 6:30 AM; Clinic ends at 9 AM.  Patients are seen on a first-come first-served basis, and a limited number are seen during each clinic.   [facility]  [address] [place], [place] [phone]   Eligibility Requirements You must have lived in [place], [place], or [place] counties for at least the last three months.   You cannot be eligible for state or federal sponsored [place], including [place], [place], or [employer].   You generally cannot be eligible for healthcare insurance through your employer.    How to apply: Eligibility screenings are held every Tuesday and Wednesday afternoon from 1:00 pm until 4:00 pm. You do not need an appointment for the interview!  [facility] [address] [place], [place] [phone]   [facility]  [phone]   [facility]  [phone]   [facility]  [phone]    Behavioral Health Resources in the Community: Intensive Outpatient Programs  Organization         Address  Phone  Notes  [facility] [address] [place], [place] [phone]   [facility] [address], [place], [place] [phone]   [facility] [address] [place], [place]  [phone]   [facility] [address],  [place], [place] 4-174-081-4481 or 5100576534   Substance Abuse Resources Organization         Address  Phone  Notes  [facility]  (607)577-0434   [facility]  [phone]   [facility]  913-490-4498   Floydene Flock  862-092-7199   Residential & Outpatient Substance Abuse Program  567-495-9471   Psychological Services Organization         Address  Phone  Notes  [facility]  [phone]   [facility]  873-648-2024   [facility] [address], [place] (210)409-1721 or [phone]    Mobile Crisis Teams Organization         Address  Phone  Notes  [facility], Mobile Crisis Care Unit  [phone]   [facility]  [address] [place], [place] [phone]   Doristine Locks [address] [place] [phone]    Self-Help/Support Groups Organization         Address  Phone             Notes  [facility] [place] -  variety of support groups  336- I7437963 Call for more information  Narcotics Anonymous (NA), [facility] [address], [employer] [facility] [place] [facility]  2 meetings at this location   Statistician         Address  Phone  Notes  [facility] [address],    [place]  [phone]   [facility]  [address] [place] [address], [place], [place] [phone]   [facility] [address] [place], [place] [phone] Admissions: 8am-3pm M-F  [facility] [address] [place] [address]    [place], [place] [phone]   [facility] [address] [place], [place], [place] [phone]   [facility] [address],  [place], [place] [phone]   [facility] - Intensive Outpatient [address], [place], [place] [phone]   [facility] ([facility] [address] [place]  [place], [place] [phone] or [phone]   [facility] ([facility]) [address], [place], [place] [phone] Accepts Medicaid  [facility] [address]  [place] [phone] Substance Abuse/Addiction Treatment   [facility] Organization         Address  Phone  Notes  [facility]  [phone]   Angie Fava, PhD [address] [place], [place]   [phone] or [phone]   [facility] Behavioral   [address] [place], [place] [phone]) [phone]   [facility] [address] [place], [place] [phone] Insurance/Medicaid/sponsorship through [facility] and Families [address]                                    [place], [place] [phone] Therapy/tele-psych/case  [facility] [address] [place] [phone]    Dr. Lolly Mustache  (  336) ([phone]   [facility] [place]  [facility] Dept. 1) [address] [place] 2) [address], [place] 3)  [address], [place] [phone]  [phone]   [facility] Child Abuse Hotline 507-201-6202 or [phone] (After Hours)

## 2014-07-23 NOTE — ED Notes (Signed)
Awake. Verbally responsive. A/O x4. Resp even and unlabored. No audible adventitious breath sounds noted. ABC's intact.  

## 2014-07-23 NOTE — ED Notes (Addendum)
Pt reports she was seen and admitted to hospital on 6/17, dx with diabetes, pt was discharged on 6/20. Pt reports she initially was doing well checking her blood sugar and administering insulin. On Saturday 6/25, pt started having intermittent right hand shaking, heart fluttering, and increased blurry vision, to the point now that pt is not able to read numbers on insulin syringe and is having difficulty administering insulin. Today pts right eye is getting worse/ "jumps more" that left eye. Pt unable to text on phone or read texts. Pt reports headache since waking up this morning 7/10, pain in increasing.

## 2014-07-23 NOTE — ED Notes (Signed)
Awake. Verbally responsive. A/O x4. Resp even and unlabored. No audible adventitious breath sounds noted. ABC's intact. SR on monitor. IV saline lock patent and intact. 

## 2014-07-23 NOTE — ED Provider Notes (Signed)
CSN: [id number]     Arrival date & time 07/23/14  0945 History   First MD Initiated Contact with Patient 07/23/14 0957     Chief Complaint  Patient presents with  . Blurred Vision  . Headache  . heart palpitations      (Consider location/radiation/quality/duration/timing/severity/associated sxs/prior Treatment) HPI   49 year old female with blurred vision. Patient recently diagnosed diabetes with admission last week. Having blurred vision leading up to this. It has continued to progress. No eye pain. Symptoms are constant. No double vision. Mild, diffuse headache. Patient reports that her blood sugars have been pretty well controlled. Consistently in the 100s. No episodes of hypoglycemia. No further polyuria or polydipsia.  Past Medical History  Diagnosis Date  . Hypertension   . Environmental allergies   . Sciatica   . Diabetes mellitus without complication    Past Surgical History  Procedure Laterality Date  . Partial hysterectomy     Family History  Problem Relation Age of Onset  . High blood pressure Father   . High blood pressure Paternal Grandmother   . High blood pressure Paternal Aunt   . Diabetes Neg Hx   . Thyroid disease Neg Hx   . Lung cancer Mother   . Cancer Brother     congenital tumor   History  Substance Use Topics  . Smoking status: Current Some Day Smoker  . Smokeless tobacco: Never Used  . Alcohol Use: No   OB History    No data available     Review of Systems  All systems reviewed and negative, other than as noted in HPI.   Allergies  Aspirin and Ibuprofen  Home Medications   Prior to Admission medications   Medication Sig Start Date End Date Taking? Authorizing Provider  acetaminophen (TYLENOL) 500 MG tablet Take 1,000 mg by mouth every 6 (six) hours as needed. For pain    Historical Provider, MD  blood glucose meter kit and supplies Dispense based on patient and insurance preference. Use up to four times daily as directed. (FOR ICD-9  250.00, 250.01). 07/16/14   Janece Canterbury, MD  diphenhydramine-acetaminophen (TYLENOL PM) 25-500 MG TABS Take 1 tablet by mouth at bedtime as needed (sleep).    Historical Provider, MD  EPINEPHrine (EPI-PEN) 0.3 mg/0.3 mL DEVI Inject 0.3 mg into the muscle as needed. For allergic reaction 02/05/11   Gertha Calkin, PA-C  fluconazole (DIFLUCAN) 100 MG tablet Take 1 tablet (100 mg total) by mouth daily. 07/16/14   Janece Canterbury, MD  insulin aspart (NOVOLOG) 100 UNIT/ML injection Inject 30 Units into the skin 3 (three) times daily with meals. 07/16/14   Janece Canterbury, MD  insulin detemir (LEVEMIR) 100 UNIT/ML injection Inject 0.5 mLs (50 Units total) into the skin 2 (two) times daily. 07/16/14   Janece Canterbury, MD  Insulin Syringe-Needle U-100 (INSULIN SYRINGE .5CC/30GX1/2") 30G X 1/2" 0.5 ML MISC Use with levemir BID and aspart TID 07/16/14   Janece Canterbury, MD  lisinopril (PRINIVIL,ZESTRIL) 10 MG tablet Take 1 tablet (10 mg total) by mouth daily. 07/16/14   Janece Canterbury, MD   BP 164/91 mmHg  Pulse 76  Temp(Src) 97.6 F (36.4 C) (Oral)  Resp 16  SpO2 99% Physical Exam  Constitutional: She is oriented to person, place, and time. She appears well-developed and well-nourished. No distress.  Laying in bed. NAD. Obese.   HENT:  Head: Normocephalic and atraumatic.  Eyes: Conjunctivae and EOM are normal. Pupils are equal, round, and reactive to light. Right eye exhibits  no discharge. Left eye exhibits no discharge.  Limited non-dilated eye exam. Unable to clearly visualized optics discs on fundoscopy in either eye.   Neck: Neck supple.  Cardiovascular: Normal rate, regular rhythm and normal heart sounds.  Exam reveals no gallop and no friction rub.   No murmur heard. Pulmonary/Chest: Effort normal and breath sounds normal. No respiratory distress.  Abdominal: Soft. She exhibits no distension. There is no tenderness.  Musculoskeletal: She exhibits no edema or tenderness.  Neurological:  She is alert and oriented to person, place, and time. No cranial nerve deficit. She exhibits normal muscle tone. Coordination normal.  Skin: Skin is warm and dry.  Psychiatric: She has a normal mood and affect. Her behavior is normal. Thought content normal.  Nursing note and vitals reviewed.   ED Course  Procedures (including critical care time) Labs Review Labs Reviewed  CBG MONITORING, ED - Abnormal; Notable for the following:    Glucose-Capillary 145 (*)    All other components within normal limits  CBC  BASIC METABOLIC PANEL  URINALYSIS, ROUTINE W REFLEX MICROSCOPIC (NOT AT [facility])  I-STAT TROPOININ, ED    Imaging Review No results found.   EKG Interpretation None      MDM   Final diagnoses:  Blurred vision    48yF with progressive blurred vision. Likely progression of diabetic retinopathy. Can happen with initiation of insulin therapy although this is fairly rapid.  Neuro exam nonfocal. Pt needs to follow-up with opthalmology.    Virgel Manifold, MD 07/25/14 [phone]

## 2014-07-23 NOTE — ED Notes (Signed)
md at bedside

## 2014-07-31 ENCOUNTER — Encounter: Payer: 59 | Attending: Internal Medicine

## 2014-07-31 VITALS — Ht 70.0 in | Wt 345.0 lb

## 2014-07-31 DIAGNOSIS — Z794 Long term (current) use of insulin: Secondary | ICD-10-CM | POA: Diagnosis not present

## 2014-07-31 DIAGNOSIS — Z713 Dietary counseling and surveillance: Secondary | ICD-10-CM | POA: Insufficient documentation

## 2014-07-31 DIAGNOSIS — E119 Type 2 diabetes mellitus without complications: Secondary | ICD-10-CM | POA: Diagnosis not present

## 2014-07-31 NOTE — Patient Instructions (Addendum)
Plan:  Aim for 3 Carb Choices per meal (45 grams) +/- 1 either way  Aim for 0-15 Carbs per snack if hungry  Include protein in moderation with your meals and snacks Consider reading food labels for Total Carbohydrate and Fat Grams of foods Consider  increasing your activity level by walking for 30 minutes daily as tolerated Consider checking BG at alternate times per day to include fasting (first thing in the morning before any food) and right before each meal, before bed.   Awake  7:30ish Test Detemir  Aspart          8-9:00 Eat Breakfast  Test Aspart Eat lunch  Test                         7-8:00 Aspart                     Detemir Dinner  Test/Bed  ALWAYS have glucose tablets with you where ever you go Carry your Aspart insulin with you at all times  Snacks: [facility] [employer] alternatives: I can't believe it's not butter light           [employer]  Terrill Mohr reduced calorie wheat bread 50cal / 9 grams of carbs Always consider Carbs& Protein in salad, (croutons, craisins)  American Diabetes Association  website

## 2014-08-01 NOTE — Progress Notes (Signed)
Diabetes Self-Management Education  Visit Type: First/Initial (DX 6/147/16)  Appt. Start Time: 0900 Appt. End Time: 1200  [name], identified by name and date of birth, is a 49 y.o. female with a diagnosis of Diabetes: Type 2.  Other people present during visit:  Patient [name] was scheduled today for Core ! Of comprehensive DSME. However she was the only participant for the class today. Rather than spreading out for three sessions, we covered all components in one session today.  ASSESSMENT  Height 5\' 10"  (1.778 m), weight 345 lb (156.491 kg). Body mass index is 49.5 kg/(m^2).  Initial Visit Information:  Are you currently following a meal plan?: Yes What type of meal plan do you follow?: low carbs Are you taking your medications as prescribed?: Yes Are you checking your feet?: No How often do you need to have someone help you when you read instructions, pamphlets, or other written materials from your doctor or pharmacy?: 1 - Never   Psychosocial:   Patient Belief/Attitude about Diabetes: Afraid Self-care barriers: Other (comment) (raising 49 yo 18) Self-management support: Doctor's office, Family, CDE visits Other persons present: Patient Patient Concerns: Nutrition/Meal planning, Healthy Lifestyle, Glycemic Control, Weight Control Special Needs: None Preferred Learning Style: No preference indicated Learning Readiness: Change in progress  Complications:   Last HgB A1C per patient/outside source: 11.4 mg/dL How often do you check your blood sugar?: 3-4 times/day 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 Patient education plan per assessed needs and concerns is to attend individual sessions for     Education Topics Reviewed with Patient Today:  Definition of diabetes, type 1 and 2, and the diagnosis of diabetes, Factors that contribute to the development of diabetes Role of diet in the treatment of diabetes and the relationship between the three main macronutrients and blood glucose level, Carbohydrate counting, Food label reading, portion sizes and measuring food., Meal options for control of blood glucose level and chronic complications. Role of exercise on diabetes management, blood pressure control and cardiac health. Reviewed patients medication for diabetes, action, purpose, timing of dose and side effects. Purpose and frequency of SMBG., Yearly dilated eye exam, Daily foot exams Relationship between chronic complications and blood glucose control, Dental care, Assessed and discussed foot care and prevention of foot problems Role of stress on diabetes  PATIENTS GOALS/Plan (Developed by the patient):  Nutrition: General guidelines for healthy choices and portions discussed Physical Activity: Exercise 3-5 times per week Medications: take my medication as prescribed Reducing Risk: treat hypoglycemia with 15 grams of carbs if blood glucose less than 70mg /dL, do foot checks daily  Plan:   Patient Instructions  Plan:  Aim for 3 Carb Choices per meal (45 grams) +/- 1 either way  Aim for 0-15 Carbs per snack if hungry  Include protein in moderation with your meals and snacks Consider reading food labels for Total Carbohydrate and Fat Grams of foods Consider  increasing your activity level by walking for 30 minutes daily as tolerated Consider checking BG at alternate times per day to include fasting (first thing in the morning before any food)  and right before each meal,  before bed.   Daily Schedule: Awake  7:30ish Test Detemir  Aspart          8-9:00 Eat Breakfast  Test Aspart Eat lunch  Test                         7-8:00 Aspart                     Detemir Dinner  Test/Bed  ALWAYS have glucose tablets with you where ever you go Carry your Aspart insulin with you at all times  Snacks: [facility] [employer] alternatives: I can't believe it's not butter light           [employer]  Terrill Mohr reduced calorie wheat bread 50cal / 9 grams of carbs Always consider Carbs& Protein in salad, (croutons, craisins)  American Diabetes Association  website   Expected Outcomes:  Demonstrated interest in learning. Expect positive outcomes  Education material provided: Living Well with Diabetes, A1C conversion sheet, Meal plan card, My Plate, Snack sheet and Support group flyer  If problems or questions, patient to contact team via:  Phone  Future DSME appointment: PRN

## 2014-08-07 ENCOUNTER — Ambulatory Visit: Payer: 59

## 2014-08-14 ENCOUNTER — Ambulatory Visit: Payer: 59

## 2014-08-17 ENCOUNTER — Emergency Department (HOSPITAL_COMMUNITY)
Admission: EM | Admit: 2014-08-17 | Discharge: 2014-08-17 | Disposition: A | Discharge status: Home / Self Care | Payer: 59 | Attending: Emergency Medicine | Admitting: Emergency Medicine

## 2014-08-17 ENCOUNTER — Encounter (HOSPITAL_COMMUNITY): Payer: Self-pay

## 2014-08-17 DIAGNOSIS — Z794 Long term (current) use of insulin: Secondary | ICD-10-CM | POA: Diagnosis not present

## 2014-08-17 DIAGNOSIS — I10 Essential (primary) hypertension: Secondary | ICD-10-CM | POA: Insufficient documentation

## 2014-08-17 DIAGNOSIS — Z720 Tobacco use: Secondary | ICD-10-CM | POA: Diagnosis not present

## 2014-08-17 DIAGNOSIS — K0381 Cracked tooth: Secondary | ICD-10-CM | POA: Diagnosis not present

## 2014-08-17 DIAGNOSIS — K029 Dental caries, unspecified: Secondary | ICD-10-CM | POA: Diagnosis not present

## 2014-08-17 DIAGNOSIS — D849 Immunodeficiency, unspecified: Secondary | ICD-10-CM | POA: Insufficient documentation

## 2014-08-17 DIAGNOSIS — K002 Abnormalities of size and form of teeth: Secondary | ICD-10-CM | POA: Diagnosis not present

## 2014-08-17 DIAGNOSIS — K047 Periapical abscess without sinus: Secondary | ICD-10-CM | POA: Diagnosis not present

## 2014-08-17 DIAGNOSIS — R112 Nausea with vomiting, unspecified: Secondary | ICD-10-CM | POA: Diagnosis not present

## 2014-08-17 DIAGNOSIS — Z79899 Other long term (current) drug therapy: Secondary | ICD-10-CM | POA: Diagnosis not present

## 2014-08-17 DIAGNOSIS — E119 Type 2 diabetes mellitus without complications: Secondary | ICD-10-CM | POA: Diagnosis not present

## 2014-08-17 DIAGNOSIS — K088 Other specified disorders of teeth and supporting structures: Secondary | ICD-10-CM | POA: Diagnosis present

## 2014-08-17 LAB — CBG MONITORING, ED: Glucose-Capillary: 103 mg/dL — ABNORMAL HIGH (ref 65–99)

## 2014-08-17 MED ORDER — DOXYCYCLINE HYCLATE 100 MG PO CAPS: 100.0000 mg | ORAL_CAPSULE | Freq: Two times a day (BID) | ORAL | Status: DC

## 2014-08-17 MED ORDER — HYDROCODONE-ACETAMINOPHEN 5-325 MG PO TABS: 1.0000 | ORAL_TABLET | Freq: Four times a day (QID) | ORAL | Status: DC | PRN

## 2014-08-17 MED ORDER — HYDROCODONE-ACETAMINOPHEN 5-325 MG PO TABS
1.0000 | ORAL_TABLET | Freq: Once | ORAL | Status: AC
  Administered 2014-08-17: 1 via ORAL
  Filled 2014-08-17: qty 1

## 2014-08-17 NOTE — Discharge Instructions (Signed)
Apply warm compresses to jaw throughout the day. Take antibiotic until finished. Take norco as directed, as needed for pain but do not drive or operate machinery with pain medication use. Followup with a dentist is very important for ongoing evaluation and management of recurrent dental pain. Call the dentist above, or use the list below to find a dentist. STOP SMOKING! Return to emergency department for emergent changing or worsening symptoms.   Dental Caries Dental caries is tooth decay. This decay can cause a hole in teeth (cavity) that can get bigger and deeper over time. HOME CARE  Brush and floss your teeth. Do this at least two times a day.  Use a fluoride toothpaste.  Use a mouth rinse if told by your dentist or doctor.  Eat less sugary and starchy foods. Drink less sugary drinks.  Avoid snacking often on sugary and starchy foods. Avoid sipping often on sugary drinks.  Keep regular checkups and cleanings with your dentist.  Use fluoride supplements if told by your dentist or doctor.  Allow fluoride to be applied to teeth if told by your dentist or doctor. Document Released: 10/22/2007 Document Revised: 05/29/2013 Document Reviewed: 01/15/2012 [facility] Patient Information 2015 [place], [place]. This information is not intended to replace advice given to you by your health care provider. Make sure you discuss any questions you have with your health care provider.  Dental Pain Toothache is pain in or around a tooth. It may get worse with chewing or with cold or heat.  HOME CARE  Your dentist may use a numbing medicine during treatment. If so, you may need to avoid eating until the medicine wears off. Ask your dentist about this.  Only take medicine as told by your dentist or doctor.  Avoid chewing food near the painful tooth until after all treatment is done. Ask your dentist about this. GET HELP RIGHT AWAY IF:   The problem gets worse or new problems appear.  You have a  fever.  There is redness and puffiness (swelling) of the face, jaw, or neck.  You cannot open your mouth.  There is pain in the jaw.  There is very bad pain that is not helped by medicine. MAKE SURE YOU:   Understand these instructions.  Will watch your condition.  Will get help right away if you are not doing well or get worse. Document Released: 07/01/2007 Document Revised: 04/06/2011 Document Reviewed: 07/01/2007 [facility] Patient Information 2015 [place], [place]. This information is not intended to replace advice given to you by your health care provider. Make sure you discuss any questions you have with your health care provider.  Dental Abscess A dental abscess is a collection of infected fluid (pus) from a bacterial infection in the inner part of the tooth (pulp). It usually occurs at the end of the tooth's root.  CAUSES   Severe tooth decay.  Trauma to the tooth that allows bacteria to enter into the pulp, such as a broken or chipped tooth. SYMPTOMS   Severe pain in and around the infected tooth.  Swelling and redness around the abscessed tooth or in the mouth or face.  Tenderness.  Pus drainage.  Bad breath.  Bitter taste in the mouth.  Difficulty swallowing.  Difficulty opening the mouth.  Nausea.  Vomiting.  Chills.  Swollen neck glands. DIAGNOSIS   A medical and dental history will be taken.  An examination will be performed by tapping on the abscessed tooth.  X-rays may be taken of the tooth  to identify the abscess. TREATMENT The goal of treatment is to eliminate the infection. You may be prescribed antibiotic medicine to stop the infection from spreading. A root canal may be performed to save the tooth. If the tooth cannot be saved, it may be pulled (extracted) and the abscess may be drained.  HOME CARE INSTRUCTIONS  Only take over-the-counter or prescription medicines for pain, fever, or discomfort as directed by your caregiver.  Rinse  your mouth (gargle) often with salt water ( tsp salt in 8 oz [250 ml] of warm water) to relieve pain or swelling.  Do not drive after taking pain medicine (narcotics).  Do not apply heat to the outside of your face.  Return to your dentist for further treatment as directed. SEEK MEDICAL CARE IF:  Your pain is not helped by medicine.  Your pain is getting worse instead of better. SEEK IMMEDIATE MEDICAL CARE IF:  You have a fever or persistent symptoms for more than 2-3 days.  You have a fever and your symptoms suddenly get worse.  You have chills or a very bad headache.  You have problems breathing or swallowing.  You have trouble opening your mouth.  You have swelling in the neck or around the eye. Document Released: 01/12/2005 Document Revised: 10/07/2011 Document Reviewed: 04/22/2010 [facility] Patient Information 2015 [place], [place]. This information is not intended to replace advice given to you by your health care provider. Make sure you discuss any questions you have with your health care provider.  Smoking Cessation, Tips for Success If you are ready to quit smoking, congratulations! You have chosen to help yourself be healthier. Cigarettes bring nicotine, tar, carbon monoxide, and other irritants into your body. Your lungs, heart, and blood vessels will be able to work better without these poisons. There are many different ways to quit smoking. Nicotine gum, nicotine patches, a nicotine inhaler, or nicotine nasal spray can help with physical craving. Hypnosis, support groups, and medicines help break the habit of smoking. WHAT THINGS CAN I DO TO MAKE QUITTING EASIER?  Here are some tips to help you quit for good:  Pick a date when you will quit smoking completely. Tell all of your friends and family about your plan to quit on that date.  Do not try to slowly cut down on the number of cigarettes you are smoking. Pick a quit date and quit smoking completely starting on that  day.  Throw away all cigarettes.   Clean and remove all ashtrays from your home, work, and car.  On a card, write down your reasons for quitting. Carry the card with you and read it when you get the urge to smoke.  Cleanse your body of nicotine. Drink enough water and fluids to keep your urine clear or pale yellow. Do this after quitting to flush the nicotine from your body.  Learn to predict your moods. Do not let a bad situation be your excuse to have a cigarette. Some situations in your life might tempt you into wanting a cigarette.  Never have "just one" cigarette. It leads to wanting another and another. Remind yourself of your decision to quit.  Change habits associated with smoking. If you smoked while driving or when feeling stressed, try other activities to replace smoking. Stand up when drinking your coffee. Brush your teeth after eating. Sit in a different chair when you read the paper. Avoid alcohol while trying to quit, and try to drink fewer caffeinated beverages. Alcohol and caffeine may urge  you to smoke.  Avoid foods and drinks that can trigger a desire to smoke, such as sugary or spicy foods and alcohol.  Ask people who smoke not to smoke around you.  Have something planned to do right after eating or having a cup of coffee. For example, plan to take a walk or exercise.  Try a relaxation exercise to calm you down and decrease your stress. Remember, you may be tense and nervous for the first 2 weeks after you quit, but this will pass.  Find new activities to keep your hands busy. Play with a pen, coin, or rubber band. Doodle or draw things on paper.  Brush your teeth right after eating. This will help cut down on the craving for the taste of tobacco after meals. You can also try mouthwash.   Use oral substitutes in place of cigarettes. Try using lemon drops, carrots, cinnamon sticks, or chewing gum. Keep them handy so they are available when you have the urge to  smoke.  When you have the urge to smoke, try deep breathing.  Designate your home as a nonsmoking area.  If you are a heavy smoker, ask your health care provider about a prescription for nicotine chewing gum. It can ease your withdrawal from nicotine.  Reward yourself. Set aside the cigarette money you save and buy yourself something nice.  Look for support from others. Join a support group or smoking cessation program. Ask someone at home or at work to help you with your plan to quit smoking.  Always ask yourself, "Do I need this cigarette or is this just a reflex?" Tell yourself, "Today, I choose not to smoke," or "I do not want to smoke." You are reminding yourself of your decision to quit.  Do not replace cigarette smoking with electronic cigarettes (commonly called e-cigarettes). The safety of e-cigarettes is unknown, and some may contain harmful chemicals.  If you relapse, do not give up! Plan ahead and think about what you will do the next time you get the urge to smoke. HOW WILL I FEEL WHEN I QUIT SMOKING? You may have symptoms of withdrawal because your body is used to nicotine (the addictive substance in cigarettes). You may crave cigarettes, be irritable, feel very hungry, cough often, get headaches, or have difficulty concentrating. The withdrawal symptoms are only temporary. They are strongest when you first quit but will go away within 10-14 days. When withdrawal symptoms occur, stay in control. Think about your reasons for quitting. Remind yourself that these are signs that your body is healing and getting used to being without cigarettes. Remember that withdrawal symptoms are easier to treat than the major diseases that smoking can cause.  Even after the withdrawal is over, expect periodic urges to smoke. However, these cravings are generally short lived and will go away whether you smoke or not. Do not smoke! WHAT RESOURCES ARE AVAILABLE TO HELP ME QUIT SMOKING? Your health care  provider can direct you to community resources or hospitals for support, which may include:  Group support.  Education.  Hypnosis.  Therapy. Document Released: 10/11/2003 Document Revised: 05/29/2013 Document Reviewed: 06/30/2012 [facility] Patient Information 2015 [place], [place]. This information is not intended to replace advice given to you by your health care provider. Make sure you discuss any questions you have with your health care provider.    Emergency Department Resource Guide 1) Find a Doctor and Pay Out of Pocket Although you won't have to find out who is covered by  your insurance plan, it is a good idea to ask around and get recommendations. You will then need to call the office and see if the doctor you have chosen will accept you as a new patient and what types of options they offer for patients who are self-pay. Some doctors offer discounts or will set up payment plans for their patients who do not have insurance, but you will need to ask so you aren't surprised when you get to your appointment.  2) Contact Your Local Health Department Not all health departments have doctors that can see patients for sick visits, but many do, so it is worth a call to see if yours does. If you don't know where your local health department is, you can check in your phone book. The CDC also has a tool to help you locate your state's health department, and many state websites also have listings of all of their local health departments.  3) Find a Walk-in Clinic If your illness is not likely to be very severe or complicated, you may want to try a walk in clinic. These are popping up all over the country in pharmacies, drugstores, and shopping centers. They're usually staffed by nurse practitioners or physician assistants that have been trained to treat common illnesses and complaints. They're usually fairly quick and inexpensive. However, if you have serious medical issues or chronic medical problems,  these are probably not your best option.  No Primary Care Doctor: - Call Health Connect at  [phone] - they can help you locate a primary care doctor that  accepts your insurance, provides certain services, etc. - Physician Referral Service- [phone]  Chronic Pain Problems: Organization         Address  Phone   Notes  [facility]  [phone] Patients need to be referred by their primary care doctor.   Medication Assistance: Organization         Address  Phone   Notes  [facility] Medication [facility] [address] [place] [place], [place] [address] [phone] --Must be a resident of [facility] -- Must have NO insurance coverage whatsoever (no Medicaid/ Medicare, etc.) -- The pt. MUST have a primary care doctor that directs their care regularly and follows them in the community   [facility]  [phone]   [place]  [phone]     Dental Care: Organization         Address  Phone  Notes  [facility] Department of [facility] [address] [place], [place] [phone] Accepts children up to age 23 who are enrolled in [place] or [place] Health Choice; pregnant women with a Medicaid card; and children who have applied for Medicaid or [place] Health Choice, but were declined, whose parents can pay a reduced fee at time of service.  [facility] Department of [facility]  [address], [place] [phone] Accepts children up to age 77 who are enrolled in [place] or [place] Health Choice; pregnant women with a Medicaid card; and children who have applied for Medicaid or [place] Health Choice, but were declined, whose parents can pay a reduced fee at time of service.  [facility]  [address] [place], [place] [phone] Patients are seen by appointment only. Walk-ins are not accepted. [facility] will see patients 23 years of age and older. Monday -  Tuesday (8am-5pm) Most Wednesdays (8:30-5pm) $30 per  visit, cash only  [facility] [employer] [facility]  [address], [facility] [phone] Patients are seen by appointment only. Walk-ins are not accepted. [facility] will see patients 9 years of age and older. One Wednesday Evening (Monthly: Volunteer Based).  $30 per visit, cash only  [employer]  [phone] for adults; Children under age 47, call Graduate Pediatric Dentistry at [phone]. Children aged 35-14, please call [phone] to request a pediatric application.  Dental services are provided in all areas of dental care including fillings, crowns and bridges, complete and partial dentures, implants, gum treatment, root canals, and extractions. Preventive care is also provided. Treatment is provided to both adults and children. Patients are selected via a lottery and there is often a waiting list.   [facility] [address] [place]  [phone] [email or web address]   [facility] [address] [place], [place] [phone], Ext. [phone] Second and Fourth Thursday of each month, opens at 6:30 AM; Clinic ends at 9 AM.  Patients are seen on a first-come first-served basis, and a limited number are seen during each clinic.   [facility]  [address] [place], [place] [phone]   Eligibility Requirements You must have lived in [place], [place], or [place] counties for at least the last three months.   You cannot be eligible for state or federal sponsored [place], including [employer], [place], or [employer].   You generally cannot be eligible for healthcare insurance through your employer.    How to apply: Eligibility screenings are held every Tuesday and Wednesday afternoon from 1:00 pm until 4:00 pm. You do not need an appointment for the interview!  [facility] [address], [place], [place]  [phone]   [facility] Health Department  [phone]   [facility] Health Department  [phone]   [facility]  [phone]

## 2014-08-17 NOTE — ED Notes (Addendum)
Patient states she has had left lower dental pain/cracked tooth since last night.

## 2014-08-17 NOTE — ED Provider Notes (Signed)
CSN: [id number]     Arrival date & time 08/17/14  1654 History   This chart was scribed for [employer], PA-C working with Alfonzo Beers, MD by Mercy Moore, ED Scribe. This patient was seen in room WTR7/WTR7 and the patient's care was started at 5:05 PM.   Chief Complaint  Patient presents with  . Dental Pain    Patient is a 49 y.o. female presenting with tooth pain. The history is provided by the patient. No language interpreter was used.  Dental Pain Location:  Lower Lower teeth location:  19/LL 1st molar Quality:  Throbbing and aching Severity:  Severe Onset quality:  Gradual Duration:  1 day Timing:  Constant Progression:  Worsening Chronicity:  Recurrent Context: dental caries and dental fracture   Context: not abscess   Relieved by:  Nothing Worsened by:  Touching and pressure Ineffective treatments:  NSAIDs (excedrin) Associated symptoms: no difficulty swallowing, no drooling, no facial pain, no facial swelling, no fever, no headaches, no neck pain, no neck swelling, no oral bleeding, no oral lesions and no trismus   Risk factors: diabetes and smoking    HPI Comments: Rebecca Avila is a 49 y.o. female with a PMHx of DM2, HTN, and sciatica, who presents to the Emergency Department complaining of intermittent left lower dental pain for two days that has been constant since last night. Patient describes 10/10 aching throbbing pain at site of fractured tooth, radiating to L ear. Patient reports associated nausea due to pain and radiating left ear pain. Aggravating factors included applied pressure and chewing. Patient reports treatment with Excedrin and salt water gargles, without relief. Patient denies gingival swelling or drainage, fever, chills, visual changes, HA, chest pain, shortness of breath, abdominal pain, vomiting, diarrhea, constipation, urinary symptoms, vaginal bleeding, vaginal discharge, numbness, tingling, weakness, rhinorhea, trismus, drooling, difficulty  swallowing, ear drainage, or difficulty swallowing. Denies face or neck swelling. Patient reports that she takes her Diabetes and Hypertension medications as directed. She states that she felt somewhat weak earlier because she didn't eat much to avoid dental pain, but her sugars have been >60 today and once she had some orange juice she felt better. Patient does not currently have a dentist. She is a smoker.   Past Medical History  Diagnosis Date  . Hypertension   . Environmental allergies   . Sciatica   . Diabetes mellitus without complication    Past Surgical History  Procedure Laterality Date  . Partial hysterectomy     Family History  Problem Relation Age of Onset  . High blood pressure Father   . High blood pressure Paternal Grandmother   . High blood pressure Paternal Aunt   . Diabetes Neg Hx   . Thyroid disease Neg Hx   . Lung cancer Mother   . Cancer Brother     congenital tumor   History  Substance Use Topics  . Smoking status: Current Some Day Smoker  . Smokeless tobacco: Never Used  . Alcohol Use: No   OB History    No data available     Review of Systems  Constitutional: Negative for fever and chills.  HENT: Positive for dental problem and ear pain. Negative for drooling, ear discharge, facial swelling, mouth sores, rhinorrhea, sore throat and trouble swallowing.   Eyes: Negative for visual disturbance.  Respiratory: Negative for shortness of breath.   Cardiovascular: Negative for chest pain.  Gastrointestinal: Positive for nausea. Negative for vomiting, abdominal pain, diarrhea and constipation.  Genitourinary: Negative for  dysuria, hematuria, vaginal bleeding and vaginal discharge.  Musculoskeletal: Negative for neck pain.  Skin: Negative for color change.  Allergic/Immunologic: Positive for immunocompromised state (diabetic).  Neurological: Negative for weakness, numbness and headaches.  A complete 10 system review of systems was obtained and all systems  are negative except as noted in the HPI and PMH.    Allergies  Aspirin and Ibuprofen  Home Medications   Prior to Admission medications   Medication Sig Start Date End Date Taking? Authorizing Provider  blood glucose meter kit and supplies Dispense based on patient and insurance preference. Use up to four times daily as directed. (FOR ICD-9 250.00, 250.01). 07/16/14   Janece Canterbury, MD  diphenhydrAMINE (SOMINEX) 25 MG tablet Take 25 mg by mouth at bedtime as needed for allergies.    Historical Provider, MD  diphenhydramine-acetaminophen (TYLENOL PM) 25-500 MG TABS Take 1 tablet by mouth at bedtime as needed (sleep).    Historical Provider, MD  EPINEPHrine (EPI-PEN) 0.3 mg/0.3 mL DEVI Inject 0.3 mg into the muscle as needed. For allergic reaction 02/05/11   Gertha Calkin, PA-C  fluconazole (DIFLUCAN) 100 MG tablet Take 1 tablet (100 mg total) by mouth daily. Patient not taking: Reported on 07/23/2014 07/16/14   Janece Canterbury, MD  insulin aspart (NOVOLOG FLEXPEN) 100 UNIT/ML FlexPen Inject 30 Units into the skin 3 (three) times daily with meals. Patient not taking: Reported on 08/01/2014 07/23/14   Virgel Manifold, MD  insulin aspart (NOVOLOG) 100 UNIT/ML injection Inject 30 Units into the skin 3 (three) times daily with meals. Patient taking differently: Inject 20 Units into the skin 3 (three) times daily with meals. Decrease dose from 30 units to 20 units with meals 07/16/14   Janece Canterbury, MD  insulin detemir (LEVEMIR) 100 UNIT/ML injection Inject 0.5 mLs (50 Units total) into the skin 2 (two) times daily. 07/16/14   Janece Canterbury, MD  Insulin Syringe-Needle U-100 (INSULIN SYRINGE .5CC/30GX1/2") 30G X 1/2" 0.5 ML MISC Use with levemir BID and aspart TID 07/16/14   Janece Canterbury, MD  lisinopril (PRINIVIL,ZESTRIL) 10 MG tablet Take 1 tablet (10 mg total) by mouth daily. 07/16/14   Janece Canterbury, MD   Triage Vitals: BP 181/100 mmHg  Pulse 74  Temp(Src) 98 F (36.7 C) (Oral)  Resp 20   SpO2 99% Physical Exam  Constitutional: She is oriented to person, place, and time. She appears well-developed and well-nourished.  Non-toxic appearance. No distress.  Afebrile, nontoxic, NAD. HTN noted, close to prior visit values.  HENT:  Head: Normocephalic and atraumatic.  Right Ear: Hearing, tympanic membrane, external ear and ear canal normal.  Left Ear: Hearing, tympanic membrane, external ear and ear canal normal.  Mouth/Throat: Uvula is midline, oropharynx is clear and moist and mucous membranes are normal. No trismus in the jaw. Abnormal dentition. Dental caries present. No dental abscesses or uvula swelling.    L lower molar #19 decayed with TTP, mild surrounding gingival erythema without swelling, no abscess or evidence of ludwig's. Diffuse dental decay. No facial swelling Ears are clear bilaterally. Nose clear. Oropharynx clear and moist, without uvular swelling or deviation, no trismus or drooling, no tonsillar swelling or erythema, no exudates.    Eyes: Conjunctivae and EOM are normal. Right eye exhibits no discharge. Left eye exhibits no discharge.  Neck: Normal range of motion. Neck supple.  Cardiovascular: Normal rate.   Pulmonary/Chest: Effort normal. No respiratory distress.  Abdominal: Normal appearance. She exhibits no distension.  Musculoskeletal: Normal range of motion.  Lymphadenopathy:  Head (left side): Submandibular adenopathy present.  Reactive L submandibular LAD which is mildly TTP  Neurological: She is alert and oriented to person, place, and time. She has normal strength. No sensory deficit.  Skin: Skin is warm, dry and intact. No rash noted.  Psychiatric: She has a normal mood and affect. Her behavior is normal.  Nursing note and vitals reviewed.   ED Course  Procedures (including critical care time)  COORDINATION OF CARE: 5:18 PM- Discussed treatment plan with patient at bedside and patient agreed to plan.   Labs Review Labs Reviewed  CBG  MONITORING, ED - Abnormal; Notable for the following:    Glucose-Capillary 103 (*)    All other components within normal limits    Imaging Review No results found.   EKG Interpretation None      MDM   Final diagnoses:  Dental decay  Dental caries  Dental infection  HTN (hypertension), benign  Tobacco abuse    49 y.o. female here with Dental pain associated with dental decay and possible dental infection with patient afebrile, non toxic appearing and swallowing secretions well. I gave patient referral to dentist and stressed the importance of dental follow up for ultimate management of dental pain.  I have also discussed reasons to return immediately to the ER.  Patient expresses understanding and agrees with plan. Smoking cessation discussed. I will also give doxycycline and pain control.  Of note, pt's BP high but pt asymptomatic, doubt need for further w/up. Discussed that excedrin will cause BP to rise and to avoid this medication.  I personally performed the services described in this documentation, which was scribed in my presence. The recorded information has been reviewed and is accurate.  BP 181/100 mmHg  Pulse 74  Temp(Src) 98 F (36.7 C) (Oral)  Resp 20  SpO2 99%  Meds ordered this encounter  Medications  . HYDROcodone-acetaminophen (NORCO/VICODIN) 5-325 MG per tablet 1 tablet    Sig:   . HYDROcodone-acetaminophen (NORCO) 5-325 MG per tablet    Sig: Take 1 tablet by mouth every 6 (six) hours as needed for severe pain.    Dispense:  6 tablet    Refill:  0    Order Specific Question:  Supervising Provider    Answer:  MILLER, BRIAN [[id number]]  . doxycycline (VIBRAMYCIN) 100 MG capsule    Sig: Take 1 capsule (100 mg total) by mouth 2 (two) times daily. One po bid x 7 days    Dispense:  14 capsule    Refill:  0    Order Specific Question:  Supervising Provider    Answer:  Noemi Chapel [[id number]]     Rebecca Sinkler Camprubi-Soms, PA-C 08/17/14 1726  Alfonzo Beers,  MD 08/17/14 [phone]

## 2014-09-05 ENCOUNTER — Ambulatory Visit: Payer: 59 | Admitting: *Deleted

## 2015-07-29 ENCOUNTER — Ambulatory Visit: Payer: Self-pay | Admitting: Internal Medicine

## 2015-08-13 ENCOUNTER — Ambulatory Visit: Payer: Self-pay | Admitting: Internal Medicine

## 2015-09-17 ENCOUNTER — Ambulatory Visit: Payer: Self-pay | Admitting: Internal Medicine

## 2017-03-01 ENCOUNTER — Emergency Department (HOSPITAL_COMMUNITY)
Admission: EM | Admit: 2017-03-01 | Discharge: 2017-03-01 | Disposition: A | Discharge status: Home / Self Care | Payer: Self-pay | Attending: Emergency Medicine | Admitting: Emergency Medicine

## 2017-03-01 ENCOUNTER — Emergency Department (HOSPITAL_COMMUNITY): Payer: Self-pay

## 2017-03-01 ENCOUNTER — Encounter (HOSPITAL_COMMUNITY): Payer: Self-pay | Admitting: Emergency Medicine

## 2017-03-01 DIAGNOSIS — Z5321 Procedure and treatment not carried out due to patient leaving prior to being seen by health care provider: Secondary | ICD-10-CM | POA: Insufficient documentation

## 2017-03-01 DIAGNOSIS — R059 Cough, unspecified: Secondary | ICD-10-CM

## 2017-03-01 DIAGNOSIS — R05 Cough: Secondary | ICD-10-CM | POA: Insufficient documentation

## 2017-03-01 NOTE — ED Triage Notes (Signed)
Patient c/o cough that is productive, sinus congestion with sore throat and ear pain for week.

## 2017-03-01 NOTE — ED Notes (Signed)
Called pt for room, no response. 

## 2017-03-01 NOTE — ED Notes (Signed)
Did not answer when called.  Second time

## 2017-06-14 ENCOUNTER — Other Ambulatory Visit: Payer: Self-pay

## 2017-06-14 ENCOUNTER — Encounter (HOSPITAL_COMMUNITY): Payer: Self-pay

## 2017-06-14 DIAGNOSIS — F17200 Nicotine dependence, unspecified, uncomplicated: Secondary | ICD-10-CM | POA: Insufficient documentation

## 2017-06-14 DIAGNOSIS — I10 Essential (primary) hypertension: Secondary | ICD-10-CM | POA: Insufficient documentation

## 2017-06-14 DIAGNOSIS — E119 Type 2 diabetes mellitus without complications: Secondary | ICD-10-CM | POA: Insufficient documentation

## 2017-06-14 DIAGNOSIS — M7989 Other specified soft tissue disorders: Secondary | ICD-10-CM | POA: Insufficient documentation

## 2017-06-14 DIAGNOSIS — Z794 Long term (current) use of insulin: Secondary | ICD-10-CM | POA: Insufficient documentation

## 2017-06-14 DIAGNOSIS — R0602 Shortness of breath: Secondary | ICD-10-CM | POA: Insufficient documentation

## 2017-06-14 DIAGNOSIS — N390 Urinary tract infection, site not specified: Secondary | ICD-10-CM | POA: Insufficient documentation

## 2017-06-14 LAB — URINALYSIS, ROUTINE W REFLEX MICROSCOPIC
BILIRUBIN URINE: NEGATIVE
GLUCOSE, UA: NEGATIVE mg/dL
Hgb urine dipstick: NEGATIVE
Ketones, ur: 5 mg/dL — AB
Nitrite: NEGATIVE
PH: 5 (ref 5.0–8.0)
PROTEIN: 30 mg/dL — AB
Specific Gravity, Urine: 1.024 (ref 1.005–1.030)

## 2017-06-14 NOTE — ED Triage Notes (Addendum)
Patient c/o dysuria and urinary frequency x 3 weeks. Patient states she was using OTC AZO. Patient also c/o headache x 4-5 days.  Patient was hypertensive in triage. Patient has not had BP meds in 2-3 years.

## 2017-06-15 ENCOUNTER — Emergency Department (HOSPITAL_COMMUNITY)
Admission: EM | Admit: 2017-06-15 | Discharge: 2017-06-15 | Disposition: A | Discharge status: Home / Self Care | Payer: Self-pay | Attending: Emergency Medicine | Admitting: Emergency Medicine

## 2017-06-15 ENCOUNTER — Emergency Department (HOSPITAL_COMMUNITY): Payer: Self-pay

## 2017-06-15 DIAGNOSIS — N390 Urinary tract infection, site not specified: Secondary | ICD-10-CM

## 2017-06-15 DIAGNOSIS — M7989 Other specified soft tissue disorders: Secondary | ICD-10-CM

## 2017-06-15 DIAGNOSIS — R030 Elevated blood-pressure reading, without diagnosis of hypertension: Secondary | ICD-10-CM

## 2017-06-15 LAB — CBC
HCT: 40.8 % (ref 36.0–46.0)
HEMOGLOBIN: 13.2 g/dL (ref 12.0–15.0)
MCH: 27.3 pg (ref 26.0–34.0)
MCHC: 32.4 g/dL (ref 30.0–36.0)
MCV: 84.3 fL (ref 78.0–100.0)
Platelets: 277 10*3/uL (ref 150–400)
RBC: 4.84 MIL/uL (ref 3.87–5.11)
RDW: 14.1 % (ref 11.5–15.5)
WBC: 9 10*3/uL (ref 4.0–10.5)

## 2017-06-15 LAB — I-STAT TROPONIN, ED: Troponin i, poc: 0.01 ng/mL (ref 0.00–0.08)

## 2017-06-15 LAB — I-STAT CHEM 8, ED
BUN: 14 mg/dL (ref 6–20)
CHLORIDE: 106 mmol/L (ref 101–111)
Calcium, Ion: 1.49 mmol/L — ABNORMAL HIGH (ref 1.15–1.40)
Creatinine, Ser: 0.9 mg/dL (ref 0.44–1.00)
Glucose, Bld: 157 mg/dL — ABNORMAL HIGH (ref 65–99)
HCT: 40 % (ref 36.0–46.0)
HEMOGLOBIN: 13.6 g/dL (ref 12.0–15.0)
POTASSIUM: 3.6 mmol/L (ref 3.5–5.1)
Sodium: 141 mmol/L (ref 135–145)
TCO2: 28 mmol/L (ref 22–32)

## 2017-06-15 LAB — BRAIN NATRIURETIC PEPTIDE: B Natriuretic Peptide: 11.3 pg/mL (ref 0.0–100.0)

## 2017-06-15 MED ORDER — LISINOPRIL 10 MG PO TABS: 10.0000 mg | ORAL_TABLET | Freq: Every day | ORAL | 1 refills | Status: DC

## 2017-06-15 MED ORDER — SODIUM CHLORIDE 0.9 % IV SOLN
1.0000 g | Freq: Once | INTRAVENOUS | Status: AC
  Administered 2017-06-15: 1 g via INTRAVENOUS
  Filled 2017-06-15: qty 10

## 2017-06-15 MED ORDER — METOCLOPRAMIDE HCL 5 MG/ML IJ SOLN
10.0000 mg | Freq: Once | INTRAMUSCULAR | Status: AC
  Administered 2017-06-15: 10 mg via INTRAVENOUS
  Filled 2017-06-15: qty 2

## 2017-06-15 MED ORDER — HYDRALAZINE HCL 20 MG/ML IJ SOLN
10.0000 mg | Freq: Once | INTRAMUSCULAR | Status: AC
  Administered 2017-06-15: 10 mg via INTRAVENOUS
  Filled 2017-06-15: qty 1

## 2017-06-15 MED ORDER — ACETAMINOPHEN 500 MG PO TABS
1000.0000 mg | ORAL_TABLET | Freq: Once | ORAL | Status: AC
  Administered 2017-06-15: 1000 mg via ORAL
  Filled 2017-06-15: qty 2

## 2017-06-15 MED ORDER — DIPHENHYDRAMINE HCL 50 MG/ML IJ SOLN
25.0000 mg | Freq: Once | INTRAMUSCULAR | Status: AC
  Administered 2017-06-15: 25 mg via INTRAVENOUS
  Filled 2017-06-15: qty 1

## 2017-06-15 MED ORDER — CEPHALEXIN 500 MG PO CAPS: 500.0000 mg | ORAL_CAPSULE | Freq: Two times a day (BID) | ORAL | 0 refills | Status: DC

## 2017-06-15 MED ORDER — LISINOPRIL 10 MG PO TABS
10.0000 mg | ORAL_TABLET | Freq: Once | ORAL | Status: AC
  Administered 2017-06-15: 10 mg via ORAL
  Filled 2017-06-15: qty 1

## 2017-06-15 NOTE — ED Notes (Signed)
Writer called lab for an add on an BNP, lab is able to add on the blood work.

## 2017-06-15 NOTE — ED Provider Notes (Signed)
[facility] DEPT Provider Note   CSN: [id number] Arrival date & time: 06/14/17  1728     History   Chief Complaint Chief Complaint  Patient presents with  . Dysuria  . Urinary Frequency  . Headache  . Hypertension    HPI Rebecca Avila is a 52 y.o. female.  Patient presents to the emergency department with a chief complaint of 2 complaints.   1. Dysuria: patient reports dysuria x 2-3 days.  She states that this feels like prior UTIs.  She denies fever, chills, n/v/d.  She has tried taking AZO with little relief.  2. HTN:  Patient reports elevated BP readings for the past while.  She states that she has run out of her lisinopril.  She has not taken anything in several years.  She reports that she has had gradually worsening headache.  She also reports some shortness of breath as well as some lower extremity swelling which are all new for her.  She denies any slurred speech or vision loss.  Denies any weakness, numbness, or tingling.  Denies any other associated symptoms.  The history is provided by the patient. No language interpreter was used.    Past Medical History:  Diagnosis Date  . Diabetes mellitus without complication ([place])   . Environmental allergies   . Hypertension   . Sciatica     Patient Active Problem List   Diagnosis Date Noted  . Hypercalcemia 07/16/2014  . Uncontrolled type 2 diabetes mellitus with hyperosmolar nonketotic hyperglycemia ([place]) 07/13/2014  . Essential hypertension 07/13/2014  . Morbid obesity with BMI of 50.0-59.9, adult ([place]) 07/13/2014  . RBBB 07/13/2014  . Thrush, oral 07/13/2014    Past Surgical History:  Procedure Laterality Date  . PARTIAL HYSTERECTOMY       OB History   None      Home Medications    Prior to Admission medications   Medication Sig Start Date End Date Taking? Authorizing Provider  blood glucose meter kit and supplies Dispense based on patient and insurance preference. Use up  to four times daily as directed. (FOR ICD-9 250.00, 250.01). 07/16/14   Janece Canterbury, MD  diphenhydrAMINE (SOMINEX) 25 MG tablet Take 25 mg by mouth at bedtime as needed for allergies.    [provider]  diphenhydramine-acetaminophen (TYLENOL PM) 25-500 MG TABS Take 1 tablet by mouth at bedtime as needed (sleep).    [provider]  doxycycline (VIBRAMYCIN) 100 MG capsule Take 1 capsule (100 mg total) by mouth 2 (two) times daily. One po bid x 7 days 08/17/14   Street, [place], [place]  EPINEPHrine (EPI-PEN) 0.3 mg/0.3 mL DEVI Inject 0.3 mg into the muscle as needed. For allergic reaction 02/05/11   Schinlever, Barnetta Chapel, PA-C  fluconazole (DIFLUCAN) 100 MG tablet Take 1 tablet (100 mg total) by mouth daily. Patient not taking: Reported on 07/23/2014 07/16/14   Janece Canterbury, MD  HYDROcodone-acetaminophen [facility]) 5-325 MG per tablet Take 1 tablet by mouth every 6 (six) hours as needed for severe pain. 08/17/14   Street, [place], PA-C  insulin aspart (NOVOLOG FLEXPEN) 100 UNIT/ML FlexPen Inject 30 Units into the skin 3 (three) times daily with meals. Patient not taking: Reported on 08/01/2014 07/23/14   Virgel Manifold, MD  insulin aspart (NOVOLOG) 100 UNIT/ML injection Inject 30 Units into the skin 3 (three) times daily with meals. Patient taking differently: Inject 20 Units into the skin 3 (three) times daily with meals. Decrease dose from 30 units to 20 units with  meals 07/16/14   Janece Canterbury, MD  insulin detemir (LEVEMIR) 100 UNIT/ML injection Inject 0.5 mLs (50 Units total) into the skin 2 (two) times daily. 07/16/14   Janece Canterbury, MD  Insulin Syringe-Needle U-100 (INSULIN SYRINGE .5CC/30GX1/2") 30G X 1/2" 0.5 ML MISC Use with levemir BID and aspart TID 07/16/14   Janece Canterbury, MD  lisinopril (PRINIVIL,ZESTRIL) 10 MG tablet Take 1 tablet (10 mg total) by mouth daily. 07/16/14   Janece Canterbury, MD    Family History Family History  Problem Relation Age of Onset  .  High blood pressure Father   . High blood pressure Paternal Grandmother   . High blood pressure Paternal Aunt   . Lung cancer Mother   . Cancer Brother        congenital tumor  . Diabetes Neg Hx   . Thyroid disease Neg Hx     Social History Social History   Tobacco Use  . Smoking status: Current Some Day Smoker  . Smokeless tobacco: Never Used  Substance Use Topics  . Alcohol use: No  . Drug use: No     Allergies   Aspirin and Ibuprofen   Review of Systems Review of Systems  All other systems reviewed and are negative.    Physical Exam Updated Vital Signs BP (!) 187/108 (BP Location: Right Arm)   Pulse 71   Temp 97.6 F (36.4 C) (Oral)   Resp 18   Ht 5' 10.5" (1.791 m)   Wt (!) 165.1 kg (364 lb)   SpO2 98%   BMI 51.49 kg/m   Physical Exam  Constitutional: She is oriented to person, place, and time. She appears well-developed and well-nourished.  HENT:  Head: Normocephalic and atraumatic.  Eyes: Pupils are equal, round, and reactive to light. Conjunctivae and EOM are normal.  Neck: Normal range of motion. Neck supple.  Cardiovascular: Normal rate and regular rhythm. Exam reveals no gallop and no friction rub.  No murmur heard. Pulmonary/Chest: Effort normal and breath sounds normal. No respiratory distress. She has no wheezes. She has no rales. She exhibits no tenderness.  Abdominal: Soft. Bowel sounds are normal. She exhibits no distension and no mass. There is no tenderness. There is no rebound and no guarding.  Musculoskeletal: Normal range of motion. She exhibits no edema or tenderness.  Neurological: She is alert and oriented to person, place, and time.  Skin: Skin is warm and dry.  Psychiatric: She has a normal mood and affect. Her behavior is normal. Judgment and thought content normal.  Nursing note and vitals reviewed.    ED Treatments / Results  Labs (all labs ordered are listed, but only abnormal results are displayed) Labs Reviewed    URINALYSIS, ROUTINE W REFLEX MICROSCOPIC - Abnormal; Notable for the following components:      Result Value   APPearance CLOUDY (*)    Ketones, ur 5 (*)    Protein, ur 30 (*)    Leukocytes, UA LARGE (*)    WBC, UA >50 (*)    Bacteria, UA RARE (*)    All other components within normal limits  CBC  I-STAT CHEM 8, ED  I-STAT TROPONIN, ED    EKG None  Radiology No results found.  Procedures Procedures (including critical care time)  Medications Ordered in ED Medications  lisinopril (PRINIVIL,ZESTRIL) tablet 10 mg (has no administration in time range)  metoCLOPramide (REGLAN) injection 10 mg (has no administration in time range)  diphenhydrAMINE (BENADRYL) injection 25 mg (has no administration in  time range)  acetaminophen (TYLENOL) tablet 1,000 mg (has no administration in time range)  cefTRIAXone (ROCEPHIN) 1 g in sodium chloride 0.9 % 100 mL IVPB (has no administration in time range)     Initial Impression / Assessment and Plan / ED Course  I have reviewed the triage vital signs and the nursing notes.  Pertinent labs & imaging results that were available during my care of the patient were reviewed by me and considered in my medical decision making (see chart for details).     Patient with 2 complaints.  Dysuria.  Urinalysis is consistent with UTI.  Will treat with Rocephin in the ED and plan for outpatient antibiotic therapy.  Hypertension: Patient has been off her blood pressure medicine for several years.  She reports gradually worsening headache and some shortness of breath and lower extremity swelling.  Will check labs, chest x-ray, EKG, and will treat headache as well as give patient her blood pressure medicine.  Will reassess.  Patient's headache is improving.  Blood pressure trending down somewhat.  Patient states that she would prefer to be discharged.  I will refill the patient's blood pressure medicine as well as write her prescription for her UTI.  Recommend  close follow-up with PCP, as she will need to have her blood pressure monitor closely.  She is neurovascularly intact.    Final Clinical Impressions(s) / ED Diagnoses   Final diagnoses:  Urinary tract infection without hematuria, site unspecified  Elevated blood pressure reading  Localized swelling of lower extremity    ED Discharge Orders        Ordered    lisinopril (PRINIVIL,ZESTRIL) 10 MG tablet  Daily     06/15/17 0505    cephALEXin (KEFLEX) 500 MG capsule  2 times daily     06/15/17 0505       Montine Circle, PA-C 06/15/17 [id number]    Rolland Porter, MD 06/15/17 [phone]

## 2018-07-31 ENCOUNTER — Encounter (HOSPITAL_COMMUNITY): Payer: Self-pay

## 2018-07-31 ENCOUNTER — Emergency Department (HOSPITAL_COMMUNITY)
Admission: EM | Admit: 2018-07-31 | Discharge: 2018-07-31 | Disposition: A | Discharge status: Home / Self Care | Payer: Self-pay | Attending: Emergency Medicine | Admitting: Emergency Medicine

## 2018-07-31 ENCOUNTER — Other Ambulatory Visit: Payer: Self-pay

## 2018-07-31 DIAGNOSIS — B373 Candidiasis of vulva and vagina: Secondary | ICD-10-CM | POA: Insufficient documentation

## 2018-07-31 DIAGNOSIS — E1165 Type 2 diabetes mellitus with hyperglycemia: Secondary | ICD-10-CM | POA: Insufficient documentation

## 2018-07-31 DIAGNOSIS — Z794 Long term (current) use of insulin: Secondary | ICD-10-CM | POA: Insufficient documentation

## 2018-07-31 DIAGNOSIS — F17200 Nicotine dependence, unspecified, uncomplicated: Secondary | ICD-10-CM | POA: Insufficient documentation

## 2018-07-31 DIAGNOSIS — B379 Candidiasis, unspecified: Secondary | ICD-10-CM

## 2018-07-31 DIAGNOSIS — I10 Essential (primary) hypertension: Secondary | ICD-10-CM | POA: Insufficient documentation

## 2018-07-31 DIAGNOSIS — Z79899 Other long term (current) drug therapy: Secondary | ICD-10-CM | POA: Insufficient documentation

## 2018-07-31 DIAGNOSIS — R739 Hyperglycemia, unspecified: Secondary | ICD-10-CM

## 2018-07-31 LAB — CBC
HCT: 45.4 % (ref 36.0–46.0)
Hemoglobin: 13.9 g/dL (ref 12.0–15.0)
MCH: 26.4 pg (ref 26.0–34.0)
MCHC: 30.6 g/dL (ref 30.0–36.0)
MCV: 86.3 fL (ref 80.0–100.0)
Platelets: 221 10*3/uL (ref 150–400)
RBC: 5.26 MIL/uL — ABNORMAL HIGH (ref 3.87–5.11)
RDW: 13.8 % (ref 11.5–15.5)
WBC: 6 10*3/uL (ref 4.0–10.5)
nRBC: 0 % (ref 0.0–0.2)

## 2018-07-31 LAB — URINALYSIS, ROUTINE W REFLEX MICROSCOPIC
Bacteria, UA: NONE SEEN
Bilirubin Urine: NEGATIVE
Glucose, UA: >=500 mg/dL — AB
Hgb urine dipstick: NEGATIVE
Ketones, ur: NEGATIVE mg/dL
Nitrite: NEGATIVE
Protein, ur: NEGATIVE mg/dL
Specific Gravity, Urine: 1.037 — ABNORMAL HIGH (ref 1.005–1.030)
pH: 6 (ref 5.0–8.0)

## 2018-07-31 LAB — WET PREP, GENITAL
Sperm: NONE SEEN
Trich, Wet Prep: NONE SEEN

## 2018-07-31 LAB — BASIC METABOLIC PANEL
Anion gap: 9 (ref 5–15)
BUN: 14 mg/dL (ref 6–20)
CO2: 25 mmol/L (ref 22–32)
Calcium: 11.1 mg/dL — ABNORMAL HIGH (ref 8.9–10.3)
Chloride: 104 mmol/L (ref 98–111)
Creatinine, Ser: 0.81 mg/dL (ref 0.44–1.00)
GFR calc Af Amer: >60 mL/min (ref >60)
GFR calc non Af Amer: >60 mL/min (ref >60)
Glucose, Bld: 357 mg/dL — ABNORMAL HIGH (ref 70–99)
Potassium: 3.9 mmol/L (ref 3.5–5.1)
Sodium: 138 mmol/L (ref 135–145)

## 2018-07-31 LAB — CBG MONITORING, ED: Glucose-Capillary: 367 mg/dL — ABNORMAL HIGH (ref 70–99)

## 2018-07-31 MED ORDER — ACETAMINOPHEN 325 MG PO TABS
650.0000 mg | ORAL_TABLET | Freq: Once | ORAL | Status: AC
  Administered 2018-07-31: 650 mg via ORAL
  Filled 2018-07-31: qty 2

## 2018-07-31 MED ORDER — FLUCONAZOLE 150 MG PO TABS: 150.0000 mg | ORAL_TABLET | Freq: Every day | ORAL | 0 refills | Status: DC

## 2018-07-31 MED ORDER — FLUCONAZOLE 150 MG PO TABS
150.0000 mg | ORAL_TABLET | Freq: Once | ORAL | Status: AC
  Administered 2018-07-31: 150 mg via ORAL
  Filled 2018-07-31: qty 1

## 2018-07-31 MED ORDER — LISINOPRIL 10 MG PO TABS: 10.0000 mg | ORAL_TABLET | Freq: Every day | ORAL | 1 refills | Status: DC

## 2018-07-31 NOTE — ED Triage Notes (Signed)
Pt states she has an 'inflamed vagina" Pt states she has a yeast infection that she has been taking monistat for. Pt also states that she has a vaginal abscess that "popped inside" her.

## 2018-07-31 NOTE — ED Provider Notes (Signed)
[facility] DEPT Provider Note   CSN: [id number] Arrival date & time: 07/31/18  1628    History   Chief Complaint Chief Complaint  Patient presents with  . Vaginal Pain    HPI Rebecca Avila is a 53 y.o. female.     HPI Pt has been having vaginal irritation this past week.  She thought she had a yeast infection so she took some monistat.  It felt a little better but then got worse again.  She has noticed some drainage.  She thinks she might have had a boil.  SHe thinks it popped but the leakage went inside.  Her vagina continues to be painful.  NO fevers or chills.  Pt is married and monogamous.  NO new detergents and lotions. Past Medical History:  Diagnosis Date  . Diabetes mellitus without complication ([place])   . Environmental allergies   . Hypertension   . Sciatica     Patient Active Problem List   Diagnosis Date Noted  . Hypercalcemia 07/16/2014  . Uncontrolled type 2 diabetes mellitus with hyperosmolar nonketotic hyperglycemia ([place]) 07/13/2014  . Essential hypertension 07/13/2014  . Morbid obesity with BMI of 50.0-59.9, adult ([place]) 07/13/2014  . RBBB 07/13/2014  . Thrush, oral 07/13/2014    Past Surgical History:  Procedure Laterality Date  . PARTIAL HYSTERECTOMY       OB History   No obstetric history on file.      Home Medications    Prior to Admission medications   Medication Sig Start Date End Date Taking? Authorizing Provider  acetaminophen (TYLENOL) 325 MG tablet Take 650 mg by mouth every 6 (six) hours as needed for moderate pain or headache.   Yes [provider]  diphenhydrAMINE (SOMINEX) 25 MG tablet Take 25 mg by mouth at bedtime as needed for allergies.   Yes [provider]  diphenhydramine-acetaminophen (TYLENOL PM) 25-500 MG TABS Take 1 tablet by mouth at bedtime as needed (sleep).   Yes [provider]  blood glucose meter kit and supplies Dispense based on patient and insurance  preference. Use up to four times daily as directed. (FOR ICD-9 250.00, 250.01). 07/16/14   Janece Canterbury, MD  cephALEXin (KEFLEX) 500 MG capsule Take 1 capsule (500 mg total) by mouth 2 (two) times daily. Patient not taking: Reported on 07/31/2018 06/15/17   Montine Circle, PA-C  doxycycline (VIBRAMYCIN) 100 MG capsule Take 1 capsule (100 mg total) by mouth 2 (two) times daily. One po bid x 7 days Patient not taking: Reported on 07/31/2018 08/17/14   Street, [place], PA-C  EPINEPHrine (EPI-PEN) 0.3 mg/0.3 mL DEVI Inject 0.3 mg into the muscle as needed. For allergic reaction 02/05/11   Schinlever, Barnetta [place], PA-C  fluconazole (DIFLUCAN) 150 MG tablet Take 1 tablet (150 mg total) by mouth daily. 07/31/18   Dorie Rank, MD  HYDROcodone-acetaminophen (NORCO) 5-325 MG per tablet Take 1 tablet by mouth every 6 (six) hours as needed for severe pain. Patient not taking: Reported on 07/31/2018 08/17/14   Street, [place], PA-C  insulin aspart (NOVOLOG FLEXPEN) 100 UNIT/ML FlexPen Inject 30 Units into the skin 3 (three) times daily with meals. Patient not taking: Reported on 08/01/2014 07/23/14   Virgel Manifold, MD  insulin aspart (NOVOLOG) 100 UNIT/ML injection Inject 30 Units into the skin 3 (three) times daily with meals. Patient not taking: Reported on 07/31/2018 07/16/14   Janece Canterbury, MD  insulin detemir (LEVEMIR) 100 UNIT/ML injection Inject 0.5 mLs (50 Units total) into the skin 2 (  two) times daily. Patient not taking: Reported on 07/31/2018 07/16/14   Janece Canterbury, MD  Insulin Syringe-Needle U-100 (INSULIN SYRINGE .5CC/30GX1/2") 30G X 1/2" 0.5 ML MISC Use with levemir BID and aspart TID 07/16/14   Janece Canterbury, MD  lisinopril (ZESTRIL) 10 MG tablet Take 1 tablet (10 mg total) by mouth daily. 07/31/18   Dorie Rank, MD    Family History Family History  Problem Relation Age of Onset  . High blood pressure Father   . High blood pressure Paternal Grandmother   . High blood pressure Paternal Aunt   .  Lung cancer Mother   . Cancer Brother        congenital tumor  . Diabetes Neg Hx   . Thyroid disease Neg Hx     Social History Social History   Tobacco Use  . Smoking status: Current Some Day Smoker  . Smokeless tobacco: Never Used  Substance Use Topics  . Alcohol use: No  . Drug use: No     Allergies   Aspirin and Ibuprofen   Review of Systems Review of Systems  All other systems reviewed and are negative.    Physical Exam Updated Vital Signs BP (!) 187/111   Pulse 69   Temp 98 F (36.7 C) (Oral)   Resp 16   Ht 1.753 m ('5\' 9"' )   Wt (!) 165.6 kg   SpO2 96%   BMI 53.90 kg/m   Physical Exam Vitals signs and nursing note reviewed.  Constitutional:      General: She is not in acute distress.    Appearance: She is well-developed.  HENT:     Head: Normocephalic and atraumatic.     Right Ear: External ear normal.     Left Ear: External ear normal.  Eyes:     General: No scleral icterus.       Right eye: No discharge.        Left eye: No discharge.     Conjunctiva/sclera: Conjunctivae normal.  Neck:     Musculoskeletal: Neck supple.     Trachea: No tracheal deviation.  Cardiovascular:     Rate and Rhythm: Normal rate and regular rhythm.  Pulmonary:     Effort: Pulmonary effort is normal. No respiratory distress.     Breath sounds: Normal breath sounds. No stridor. No wheezing or rales.  Abdominal:     General: Bowel sounds are normal. There is no distension.     Palpations: Abdomen is soft.     Tenderness: There is no abdominal tenderness. There is no guarding or rebound.  Genitourinary:    Labia:        Right: Rash present.        Left: Rash present.      Vagina: Vaginal discharge present.     Uterus: Not tender.      Adnexa:        Right: No tenderness.         Left: No tenderness.       Comments: Labia with mild edema and erythema, whitish exudate noted in the vaginal wall and perineum Musculoskeletal:        General: No tenderness.  Skin:     General: Skin is warm and dry.     Findings: No rash.  Neurological:     Mental Status: She is alert.     Cranial Nerves: No cranial nerve deficit (no facial droop, extraocular movements intact, no slurred speech).     Sensory: No sensory deficit.  Motor: No abnormal muscle tone or seizure activity.     Coordination: Coordination normal.      ED Treatments / Results  Labs (all labs ordered are listed, but only abnormal results are displayed) Labs Reviewed  WET PREP, GENITAL - Abnormal; Notable for the following components:      Result Value   Yeast Wet Prep HPF POC PRESENT (*)    Clue Cells Wet Prep HPF POC PRESENT (*)    WBC, Wet Prep HPF POC FEW (*)    All other components within normal limits  URINALYSIS, ROUTINE W REFLEX MICROSCOPIC - Abnormal; Notable for the following components:   APPearance HAZY (*)    Specific Gravity, Urine 1.037 (*)    Glucose, UA >=500 (*)    Leukocytes,Ua SMALL (*)    All other components within normal limits  CBC - Abnormal; Notable for the following components:   RBC 5.26 (*)    All other components within normal limits  BASIC METABOLIC PANEL - Abnormal; Notable for the following components:   Glucose, Bld 357 (*)    Calcium 11.1 (*)    All other components within normal limits  CBG MONITORING, ED - Abnormal; Notable for the following components:   Glucose-Capillary 367 (*)    All other components within normal limits  RPR  HIV ANTIBODY (ROUTINE TESTING W REFLEX)  GC/CHLAMYDIA PROBE AMP ([place]) NOT AT [facility]    EKG None  Radiology No results found.  Procedures Procedures (including critical care time)  Medications Ordered in ED Medications  acetaminophen (TYLENOL) tablet 650 mg (650 mg Oral Given 07/31/18 2018)  fluconazole (DIFLUCAN) tablet 150 mg (150 mg Oral Given 07/31/18 2149)     Initial Impression / Assessment and Plan / ED Course  I have reviewed the triage vital signs and the nursing notes.  Pertinent labs &  imaging results that were available during my care of the patient were reviewed by me and considered in my medical decision making (see chart for details).  Clinical Course as of Jul 30 2153  Nancy Fetter Jul 31, 2018  2014 Blood sugar noted to be 367.  Patient's blood pressure also remains elevated.  Patient indicated that she was not taking any medications for her diabetes.  Sounds like she may have been managing on diet alone but has not been checking her blood sugars seeing a doctor.   [JK]  2151 Labs reviewed.  CBC and electrolyte panel are normal.   [JK]    Clinical Course User Index [JK] Dorie Rank, MD   Patient's physical exam is consistent with Candida vaginitis.  Patient was noted to have hyperglycemia.  Labs were checked and no signs of any significant abnormalities other than hyperglycemia.  Patient admits she has not been taking her blood pressure or diabetes medications.  I explained to her that it is very important for her to take both her blood pressure and diabetes medications.  Her blood pressure and blood sugar were uncontrolled.  I explained to the patient that her yeast infection is likely related to her uncontrolled hyperglycemia.  Patient was given a dose of Diflucan.  I will give her prescription for her lisinopril.  I instructed her to resume taking her insulin medication.  Final Clinical Impressions(s) / ED Diagnoses   Final diagnoses:  Yeast infection  Hyperglycemia  Hypertension, unspecified type    ED Discharge Orders         Ordered    lisinopril (ZESTRIL) 10 MG tablet  Daily  07/31/18 2153    fluconazole (DIFLUCAN) 150 MG tablet  Daily     07/31/18 2154           Dorie Rank, MD 07/31/18 2156

## 2018-07-31 NOTE — Discharge Instructions (Signed)
Start taking your insulin medication again for your diabetes.  Resume taking blood pressure medications for your high blood pressure.  Follow-up with your primary care doctor to recheck on your blood pressure and blood sugar.  The Diflucan medication should treat the yeast infection.  If urine feeling better in a few days take an additional dose.

## 2018-08-01 LAB — RPR: RPR Ser Ql: NONREACTIVE

## 2018-08-01 LAB — HIV ANTIBODY (ROUTINE TESTING W REFLEX): HIV Screen 4th Generation wRfx: NONREACTIVE

## 2018-08-02 LAB — GC/CHLAMYDIA PROBE AMP (~~LOC~~) NOT AT ARMC
Chlamydia: NEGATIVE
Neisseria Gonorrhea: NEGATIVE

## 2018-09-07 ENCOUNTER — Ambulatory Visit: Payer: Self-pay | Admitting: Internal Medicine

## 2018-09-07 ENCOUNTER — Encounter: Payer: Self-pay | Admitting: Internal Medicine

## 2018-09-07 ENCOUNTER — Other Ambulatory Visit: Payer: Self-pay

## 2018-09-07 VITALS — BP 220/120 | HR 72 | Temp 98.6°F | Resp 14 | Ht 66.5 in | Wt 278.0 lb

## 2018-09-07 DIAGNOSIS — I10 Essential (primary) hypertension: Secondary | ICD-10-CM

## 2018-09-07 DIAGNOSIS — E1165 Type 2 diabetes mellitus with hyperglycemia: Secondary | ICD-10-CM

## 2018-09-07 DIAGNOSIS — Z794 Long term (current) use of insulin: Secondary | ICD-10-CM

## 2018-09-07 DIAGNOSIS — B3731 Acute candidiasis of vulva and vagina: Secondary | ICD-10-CM

## 2018-09-07 DIAGNOSIS — Z6841 Body Mass Index (BMI) 40.0 and over, adult: Secondary | ICD-10-CM

## 2018-09-07 DIAGNOSIS — F411 Generalized anxiety disorder: Secondary | ICD-10-CM

## 2018-09-07 DIAGNOSIS — B373 Candidiasis of vulva and vagina: Secondary | ICD-10-CM

## 2018-09-07 DIAGNOSIS — F410 Panic disorder [episodic paroxysmal anxiety] without agoraphobia: Secondary | ICD-10-CM

## 2018-09-07 MED ORDER — METFORMIN HCL ER 500 MG PO TB24: ORAL_TABLET | ORAL | 11 refills | Status: DC

## 2018-09-07 MED ORDER — GLIPIZIDE 5 MG PO TABS: 5.0000 mg | ORAL_TABLET | Freq: Two times a day (BID) | ORAL | 11 refills | Status: DC

## 2018-09-07 MED ORDER — AMLODIPINE BESYLATE 5 MG PO TABS: 5.0000 mg | ORAL_TABLET | Freq: Every day | ORAL | 11 refills | Status: DC

## 2018-09-07 MED ORDER — FLUCONAZOLE 150 MG PO TABS: ORAL_TABLET | ORAL | 0 refills | Status: DC

## 2018-09-07 MED ORDER — LISINOPRIL 10 MG PO TABS: 10.0000 mg | ORAL_TABLET | Freq: Every day | ORAL | 11 refills | Status: DC

## 2018-09-07 MED ORDER — CLONAZEPAM 1 MG PO TABS: ORAL_TABLET | ORAL | 0 refills | Status: DC

## 2018-09-07 NOTE — Progress Notes (Signed)
Subjective:    Patient ID: Rebecca Avila, female   DOB: Jul 02, 1965, 53 y.o.   MRN: [id number]   HPI   1.  Hypertension:  Diagnosed in 2016 when first came to [facility].  Lost to follow up as lots of family concerns.   Was off Lisinopril 10 mg for 4 years until seen in ED for a yeast infection and bp was up.  She was restarted on Lisinopril 10 mg daily.  2.  DM:  Also diagnosed 4 years ago.  Weighed 330 lbs at the time. Admitted to hospital with nonketotic hyperosmolar coma with sugars well above 500. Did not tolerate Metformin 500 mg twice daily.  GI intolerance. She has never taken Glipizide.   Was taking Levemir 50 units twice daily and Humalog 5 units 3 times daily at the time. Has not used any treatment for about 3-4 years. Has not had her eyes checked for years.   3.  Recent vaginal yeast infection.  Did not resolve with 2 day treatment of Fluconazole 150 mg daily.    4.  Anxiety/panic with pandemic.  Difficulty wanting to work due to concerns for getting ill with COVID19.  5.  Chart review:  After patient left:  History of mild hypercalcemia with elevated ionized calcium dating back to 2014.  Current Meds  Medication Sig  . acetaminophen (TYLENOL) 325 MG tablet Take 650 mg by mouth every 6 (six) hours as needed for moderate pain or headache.  . diphenhydrAMINE (SOMINEX) 25 MG tablet Take 25 mg by mouth at bedtime as needed for allergies.   Allergies  Allergen Reactions  . Aspirin Anaphylaxis  . Ibuprofen Anaphylaxis   Past Medical History:  Diagnosis Date  . Diabetes mellitus without complication ([facility])   . Environmental allergies   . Hypercalcemia 2014   2020--significant increase in calcium/ionized calcium with elevated PTH--hyperparathyroidism  . Hypertension   . Sciatica    Past Surgical History:  Procedure Laterality Date  . PARTIAL HYSTERECTOMY     Family History  Problem Relation Age of Onset  . High blood pressure Father   . High blood pressure Paternal  Grandmother   . High blood pressure Paternal Aunt   . Lung cancer Mother   . Cancer Brother        congenital tumor  . Diabetes Neg Hx   . Thyroid disease Neg Hx     Social History   Socioeconomic History  . Marital status: Single    Spouse name: Not on file  . Number of children: Not on file  . Years of education: Not on file  . Highest education level: Not on file  Occupational History  . Not on file  Social Needs  . Financial resource strain: Not on file  . Food insecurity    Worry: Not on file    Inability: Not on file  . Transportation needs    Medical: Not on file    Non-medical: Not on file  Tobacco Use  . Smoking status: Current Some Day Smoker    Packs/day: 0.00    Types: Cigarettes    Start date: 01/27/1992  . Smokeless tobacco: Never Used  Substance and Sexual Activity  . Alcohol use: No  . Drug use: No  . Sexual activity: Yes    Birth control/protection: Surgical  Lifestyle  . Physical activity    Days per week: Not on file    Minutes per session: Not on file  . Stress: Not on file  Relationships  . Social Herbalist on phone: Not on file    Gets together: Not on file    Attends religious service: Not on file    Active member of club or organization: Not on file    Attends meetings of clubs or organizations: Not on file    Relationship status: Not on file  . Intimate partner violence    Fear of current or ex partner: Not on file    Emotionally abused: Not on file    Physically abused: Not on file    Forced sexual activity: Not on file  Other Topics Concern  . Not on file  Social History Narrative   Lives with fiance.  No home health services.  No assist devices.  Currently unemployed.       Review of Systems    Objective:   BP (!) 220/120 (BP Location: Left Arm, Patient Position: Sitting, Cuff Size: Normal)   Pulse 72   Temp 98.6 F (37 C)   Resp 14   Ht 5' 6.5" (1.689 m)   Wt 278 lb (126.1 kg)   BMI 44.20 kg/m    Physical Exam  NAD HEENT:  PERRL EOMI, discs sharp bilaterally without obvious hemorrhages Neck:  Supple, No adenopathy, no thyromegaly Chest:  CTA CV:  RRR with normal S1 and S2, No S3, S4 or murmur.  Radial and DP pulses normal and equal LE:  No edema   Assessment & Plan  1.  DM:  Willing to try oral meds instead of insulin.  Glipizide 5 mg twice daily and will try the ER form of Metformin 500 mg twice daily.  Continue to work on improving diet and increasing daily physical activity.  2.  Hypertension:  Continue Lisinopril 10 mg and add Amlodipine 5 mg daily.  BP check with fasting labs in 1-2 weeks.  3.  Yeast vaginitis:  Needs sugars controlled along with treatment of yeast.  Fluconazole 150 mg daily for 3 days.  4.  History of hypercalcemia and elevated at ED visit on 07/31/2018.  Recheck with labs in 2 weeks.  Ionized calcium if elevated as well as PTH.  Kidney function appears fine.  5.  Anxiety/panic disorder:  Warm hand off to [employer], LCSW-A.  Clonazepam for now.  Will not continue to refill this med--only temporary until evaluation with T. [facility].

## 2018-09-20 ENCOUNTER — Other Ambulatory Visit (INDEPENDENT_AMBULATORY_CARE_PROVIDER_SITE_OTHER): Payer: Self-pay

## 2018-09-20 ENCOUNTER — Other Ambulatory Visit: Payer: Self-pay

## 2018-09-20 VITALS — BP 190/120 | HR 88

## 2018-09-20 DIAGNOSIS — I10 Essential (primary) hypertension: Secondary | ICD-10-CM

## 2018-09-20 DIAGNOSIS — E1100 Type 2 diabetes mellitus with hyperosmolarity without nonketotic hyperglycemic-hyperosmolar coma (NKHHC): Secondary | ICD-10-CM

## 2018-09-20 DIAGNOSIS — Z13220 Encounter for screening for lipoid disorders: Secondary | ICD-10-CM

## 2018-09-20 NOTE — Progress Notes (Signed)
Patient BP remains high. Patient brought in her BP cuff and it is a wrist cuff. Big difference from manual BP. Patient has been taking the lisinopril and not the amlodipine. Per Dr. Amil Amen patient needs to take both and back in 2 weeks for BP and pulse check. Patient verbalized understanding. BP appointment scheduled.

## 2018-09-21 LAB — BASIC METABOLIC PANEL
BUN/Creatinine Ratio: 13 (ref 9–23)
BUN: 11 mg/dL (ref 6–24)
CO2: 23 mmol/L (ref 20–29)
Calcium: 11.9 mg/dL — ABNORMAL HIGH (ref 8.7–10.2)
Chloride: 101 mmol/L (ref 96–106)
Creatinine, Ser: 0.84 mg/dL (ref 0.57–1.00)
GFR calc Af Amer: 93 mL/min/{1.73_m2} (ref >59)
GFR calc non Af Amer: 81 mL/min/{1.73_m2} (ref >59)
Glucose: 192 mg/dL — ABNORMAL HIGH (ref 65–99)
Potassium: 3.9 mmol/L (ref 3.5–5.2)
Sodium: 140 mmol/L (ref 134–144)

## 2018-09-21 LAB — MICROALBUMIN / CREATININE URINE RATIO
Creatinine, Urine: 166.6 mg/dL
Microalb/Creat Ratio: 28 mg/g creat (ref 0–29)
Microalbumin, Urine: 45.9 ug/mL

## 2018-09-21 LAB — LIPID PANEL W/O CHOL/HDL RATIO
Cholesterol, Total: 206 mg/dL — ABNORMAL HIGH (ref 100–199)
HDL: 44 mg/dL (ref >39)
LDL Calculated: 134 mg/dL — ABNORMAL HIGH (ref 0–99)
Triglycerides: 139 mg/dL (ref 0–149)
VLDL Cholesterol Cal: 28 mg/dL (ref 5–40)

## 2018-09-21 LAB — HGB A1C W/O EAG: Hgb A1c MFr Bld: 10.9 % — ABNORMAL HIGH (ref 4.8–5.6)

## 2018-09-21 MED ORDER — SIMVASTATIN 40 MG PO TABS: ORAL_TABLET | ORAL | 11 refills | Status: DC

## 2018-09-21 NOTE — Addendum Note (Signed)
Addended by: Marcelino Duster on: 09/21/2018 11:49 AM   Modules accepted: Orders

## 2018-10-04 ENCOUNTER — Other Ambulatory Visit: Payer: Self-pay

## 2018-10-04 ENCOUNTER — Ambulatory Visit (INDEPENDENT_AMBULATORY_CARE_PROVIDER_SITE_OTHER): Payer: Self-pay

## 2018-10-04 VITALS — BP 142/98 | HR 78

## 2018-10-04 DIAGNOSIS — I10 Essential (primary) hypertension: Secondary | ICD-10-CM

## 2018-10-06 NOTE — Progress Notes (Signed)
Patient BP still running high. Will consult with Dr. Amil Amen and contact patient with any medication changes. Patient verbalized understanding.

## 2018-10-10 ENCOUNTER — Ambulatory Visit (INDEPENDENT_AMBULATORY_CARE_PROVIDER_SITE_OTHER): Payer: Self-pay | Admitting: Licensed Clinical Social Worker

## 2018-10-10 DIAGNOSIS — F411 Generalized anxiety disorder: Secondary | ICD-10-CM

## 2018-10-10 DIAGNOSIS — F410 Panic disorder [episodic paroxysmal anxiety] without agoraphobia: Secondary | ICD-10-CM

## 2018-10-20 NOTE — Progress Notes (Signed)
Biopsychosocial Assessment Note  Rebecca Avila 53 y.o. 10/20/2018   Referred by: Dr. Felizardo Hoffmann  PRESENTING PROBLEM Chief Complaint: Anxiety  What are the main stressors in your life right now?Anxiety   3, Racing Thoughts   2 and Excessive Worrying   3  Describe a brief history of your present symptoms: Patient states she is very anxious and worried about becoming sick with COVID-19.  Patient states she is always anxious and only goes out to the grocery store and medical appointments. Patient states she is not sleeping well, and she has noticed she is isolating from friends and family but she is too anxious to meet with anyone.  Patient states she is concerned about her current employment situation but does not want to work out of the home.    How long have you had these symptoms?: Since March 2020 What effect have they had on your life?: Patient states she does not go out or meet with friends anymore, even if social distancing is practiced.  Patient states she is always on edge and she had trouble sleeping. Constantly worries about money and getting sick.   FAMILY ASSESSMENT Was the significant other/family member interviewed? NA If No, why?: N/A Is significant other/family member supportive? NA Did significant other/family member express concerns for the patient? NA If Yes, describe: NA  Is significant other/family member willing to be part of treatment plan? NA Describe significant other/family member's perception of patient's illness: NA  Describe significant other/family member's perception of expectations with treatment: NA   MENTAL HEALTH HISTORY Have you ever been treated for a mental health problem? Yes  If Yes, when? 09/07/2018 , where? [facility], by whom? Dr. Felizardo Hoffmann Are you currently seeing a therapist or counselor? No If Yes, whom? NA Have you ever had a mental health hospitalization? No If Yes, when? NA , where? NA, why? NA, how many times? NA Have you ever had  suicidal thoughts or attempted suicide? No If Yes, when? NA  Describe NA  Have you ever been treated with medication for a mental health problem?Yes If Yes, please list as completely as possible (name of medication, reason prescribed, and response: Clonazepam (Klonopin) 1MG , 1/2 1x daily, as needed   FAMILY MENTAL HEALTH HISTORY Is there any history of mental health problems or substance abuse in your family? NA If Yes, please explain (include information on parents, siblings, aunts/uncles, grandparents, cousins, etc.): NA Has anyone in your family been hospitalized for mental health problems? NA If Yes, please explain (including who, where, and for what length of time): NA   MARITAL STATUS Are you presently: Married How many times have you been married? 1 Dates of previous marriages: NA Do you have any concerns regarding marriage? No If Yes, please explain: NA Do you have any children? NA  If Yes, how many? NA Please list their sexes and ages: NA   LEISURE/RECREATION Describe how patient spends leisure time: Listen to music, talk to friends, watch movies  SOCIAL AND FAMILY HISTORY Who lives in your current household? patient lives with friend Where were you born?  Where did you grow up?  Describe the household where you grew up:   Do you have siblings, step/half siblings? Yes If Yes, please list names, sex and ages: Are your parents still living? Yes If No, what was the cause of death?  If Yes, father's age:  His health:  If Yes, mother's age:  Her health:  Where do your parents live?  Do you see them often? No If No, why not? Not since COVID-19  Are your parents separated/divorced? No If Yes, approximately when?  Have you ever been exposed to any form of abuse? No If Yes: NA Did the abuse happen recently, or in the past? NA Were you the victim or offender, please explain: NA  Are you having problems with any member or your family? No If Yes, please explain:    What Religion are you? Baptist Do you have any cultural or religious beliefs which could impact your treatment? No If Yes, please explain (including customs, celebrations, attitude towards alcohol and drugs, authority in family, etc):    Have you ever been in the [employer]? NA If Yes, when?  for how long?   Were you ever in active combat? NA If Yes, when?  for how long?  Were there any lasting effects on you? NA If Yes, please explain:   Why did you leave the [employer] (include type of discharge, disciplinary action, substance abuse, or any Post Traumatic Stress Symptoms):   Do you have any legal problems/involvements? No If Yes, please explain:    EDUCATIONAL BACKGROUND How many grades have you completed? some college Do you hold any Degrees? No If Yes, in what? From where?  What were your special talents/interests in school?  Did you have any problems in school? No If Yes, were these problems behavioral, attentional, or due to learning difficulties?  Were any medications ever prescribed for these problems? No If Yes, what were the medications, including the dosage, how long you took these and who prescribed them?    WORK HISTORY Do you work? 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   Name of employer:  Do you enjoy your present job? NA What is your previous work history? 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 Are you having trouble on your present job or had difficulties holding a job? Yes If Yes, please explain: Patient states she is currently not working because of COVID-19, and she is afraid to work in an office with other people because of the increase chance of contagion. Patient states she would like to find a job which telecommutes and she can do from home.  Does your spouse work? NA If Yes, where and for how long? Are you under financial stress? Yes If Yes, please  explain: Patient states she is having financial difficulties because she is not working, and is having a difficult time finding a job that she can do from home.  Financial Resources  Patient is: Self supportive (no assistance) No    Requires referral for financial assistance Patient states she will think about it.  Requires referral for credit counseling NA  Current situation affects financial situation Yes  Adolescent/child in need of financial support No  Is there anything else you would like to tell us? Patient states she would like to go back to work, but wants to stop being afraif and anxious.  Patient states she wants to feel safe going out in public again without being afraid.  Adelene Amas, LCSWA 10/20/2018

## 2018-10-21 ENCOUNTER — Other Ambulatory Visit: Payer: Self-pay

## 2018-10-21 VITALS — BP 175/120 | HR 82

## 2018-10-21 DIAGNOSIS — F410 Panic disorder [episodic paroxysmal anxiety] without agoraphobia: Secondary | ICD-10-CM | POA: Insufficient documentation

## 2018-10-21 DIAGNOSIS — I10 Essential (primary) hypertension: Secondary | ICD-10-CM

## 2018-10-22 LAB — BASIC METABOLIC PANEL
BUN/Creatinine Ratio: 8 — ABNORMAL LOW (ref 9–23)
BUN: 8 mg/dL (ref 6–24)
CO2: 24 mmol/L (ref 20–29)
Calcium: 12.3 mg/dL — ABNORMAL HIGH (ref 8.7–10.2)
Chloride: 102 mmol/L (ref 96–106)
Creatinine, Ser: 0.95 mg/dL (ref 0.57–1.00)
GFR calc Af Amer: 80 mL/min/{1.73_m2} (ref >59)
GFR calc non Af Amer: 70 mL/min/{1.73_m2} (ref >59)
Glucose: 85 mg/dL (ref 65–99)
Potassium: 4 mmol/L (ref 3.5–5.2)
Sodium: 139 mmol/L (ref 134–144)

## 2018-10-24 ENCOUNTER — Telehealth: Payer: Self-pay | Admitting: Licensed Clinical Social Worker

## 2018-10-24 MED ORDER — AMLODIPINE BESYLATE 10 MG PO TABS: 10.0000 mg | ORAL_TABLET | Freq: Every day | ORAL | 11 refills | Status: DC

## 2018-10-24 NOTE — Progress Notes (Signed)
Patient BP still running high. Per Dr. Amil Amen have patient increase amlodipine to 10 mg daily. Recheck BP in 1 week. Spoke with patient and she has been informed and verbalized understanding.BP appointment scheduled. New Rx for amlodipine 10 mg sent to pharmacy. Patient will double up on 5 mg until gone then start new Rx for 10 mg.

## 2018-10-25 ENCOUNTER — Other Ambulatory Visit: Payer: Self-pay

## 2018-10-25 DIAGNOSIS — E213 Hyperparathyroidism, unspecified: Secondary | ICD-10-CM

## 2018-10-26 ENCOUNTER — Encounter: Payer: Self-pay | Admitting: Internal Medicine

## 2018-10-26 LAB — CALCIUM, IONIZED: Calcium, Ion: 6.6 mg/dL (ref 4.5–5.6)

## 2018-10-26 LAB — PARATHYROID HORMONE, INTACT (NO CA): PTH: 150 pg/mL — ABNORMAL HIGH (ref 15–65)

## 2018-10-27 ENCOUNTER — Telehealth: Payer: Self-pay | Admitting: Licensed Clinical Social Worker

## 2018-10-28 ENCOUNTER — Telehealth: Payer: Self-pay | Admitting: Licensed Clinical Social Worker

## 2018-10-31 ENCOUNTER — Other Ambulatory Visit: Payer: Self-pay

## 2018-10-31 ENCOUNTER — Telehealth: Payer: Self-pay

## 2018-10-31 ENCOUNTER — Ambulatory Visit (INDEPENDENT_AMBULATORY_CARE_PROVIDER_SITE_OTHER): Payer: Self-pay

## 2018-10-31 VITALS — BP 152/100 | HR 78

## 2018-10-31 DIAGNOSIS — E210 Primary hyperparathyroidism: Secondary | ICD-10-CM

## 2018-10-31 DIAGNOSIS — I10 Essential (primary) hypertension: Secondary | ICD-10-CM

## 2018-10-31 LAB — CALCITRIOL (1,25 DI-OH VIT D): Vit D, 1,25-Dihydroxy: 83.4 pg/mL — ABNORMAL HIGH (ref 19.9–79.3)

## 2018-10-31 LAB — SPECIMEN STATUS REPORT

## 2018-10-31 NOTE — Telephone Encounter (Signed)
Patient BP still running high. Patient states she took her medication this morning before coming in for appointment and she has not missed any doses. Patient currently taking amlodipine 10 mg and Lisinopril 10 mg. Informed patient I would speak with Dr. Amil Amen regarding this and call her back. Patient also asking for  A refill on clonazepam. Patient informed I will give message to Dr. Amil Amen.   Rebecca Avila states she spoke with Sande Rives and patient orange card should be ready tomorrow for her to get testing done at [place]. Patient is aware.  To Dr. Amil Amen for further directions.

## 2018-10-31 NOTE — Progress Notes (Signed)
Patient BP still running high. Patient states she took her medication this morning before coming in for appointment and she has not missed any doses. Informed patient I would speak with Dr. Amil Amen regarding this and call her back. Patient also asking for  A refill on clonazepam. Patient informed I will give message to Dr. Amil Amen.   Rebecca Avila states she spoke with Sande Rives and patient orange card should be ready tomorrow for her to get testing done at [place]. Patient is aware.

## 2018-11-01 ENCOUNTER — Telehealth (INDEPENDENT_AMBULATORY_CARE_PROVIDER_SITE_OTHER): Payer: Self-pay | Admitting: Licensed Clinical Social Worker

## 2018-11-01 DIAGNOSIS — F331 Major depressive disorder, recurrent, moderate: Secondary | ICD-10-CM

## 2018-11-01 DIAGNOSIS — F410 Panic disorder [episodic paroxysmal anxiety] without agoraphobia: Secondary | ICD-10-CM

## 2018-11-01 DIAGNOSIS — F411 Generalized anxiety disorder: Secondary | ICD-10-CM

## 2018-11-02 ENCOUNTER — Other Ambulatory Visit: Payer: Self-pay | Admitting: Internal Medicine

## 2018-11-02 ENCOUNTER — Encounter: Payer: Self-pay | Admitting: Internal Medicine

## 2018-11-02 MED ORDER — AGAMATRIX PRESTO W/DEVICE KIT: PACK | 0 refills | Status: DC

## 2018-11-02 MED ORDER — AGAMATRIX PRESTO TEST VI STRP: ORAL_STRIP | 11 refills | Status: DC

## 2018-11-02 MED ORDER — AGAMATRIX ULTRA-THIN LANCETS MISC: 11 refills | Status: DC

## 2018-11-02 MED ORDER — LISINOPRIL 20 MG PO TABS: ORAL_TABLET | ORAL | 11 refills | Status: DC

## 2018-11-02 MED ORDER — SERTRALINE HCL 25 MG PO TABS: ORAL_TABLET | ORAL | 2 refills | Status: DC

## 2018-11-02 NOTE — Progress Notes (Signed)
Needs referral to General Surgery for symptomatic primary hyperparathyroidism Awaiting bone scan and KUB

## 2018-11-02 NOTE — Telephone Encounter (Signed)
Spoke with [facility] imaging and patient can just walk in to get X-Ray and bone density scan is being scheduled out into the end of December. Per Dr. Amil Amen okay to cancel bone density scan and general surgery referral will need to go through orange card. Referral given to [facility].

## 2018-11-02 NOTE — Progress Notes (Signed)
See recent phone note with plans.

## 2018-11-02 NOTE — Telephone Encounter (Signed)
Spoke with patient. Patient verbalized understanding.

## 2018-11-02 NOTE — Telephone Encounter (Signed)
Called patient. She has a history of mild depression and anxiety from loss of parents. The pandemic has made her anxiety issues worse and she does feel she has panic attacks now. Discussed not using Clonazepam long term and starting on Sertraline.  She if okay with that.  Regarding likely primary hyperparathyroidism:  To hold on supplemental calcium and Vitamin D for now. She is drinking lots of water already. She is having muscle cramping and just aching.  No history of stones. Awaiting orange card to see about getting DXA and KUB with [facility]  Regarding hypertension:  Continuing on Amlodipine 10 mg daily and will increase Lisinopril to 20 mg daily--double up on what she has for now and next fill will be the 20 mg tabs  Needs CMP in 1 week with BP check and I need to make sure no suicidal ideation at this nurse visit.  Please schedule.  Rebecca Avila--is her orange card done?  Would like her to get the radiologic evaluations and glucometer and test strips.

## 2018-11-15 ENCOUNTER — Telehealth: Payer: Self-pay | Admitting: Licensed Clinical Social Worker

## 2018-11-17 ENCOUNTER — Encounter: Payer: Self-pay | Admitting: Internal Medicine

## 2018-11-17 ENCOUNTER — Other Ambulatory Visit: Payer: Self-pay

## 2018-11-17 ENCOUNTER — Telehealth (INDEPENDENT_AMBULATORY_CARE_PROVIDER_SITE_OTHER): Payer: Self-pay | Admitting: Licensed Clinical Social Worker

## 2018-11-17 DIAGNOSIS — F410 Panic disorder [episodic paroxysmal anxiety] without agoraphobia: Secondary | ICD-10-CM

## 2018-11-17 DIAGNOSIS — F411 Generalized anxiety disorder: Secondary | ICD-10-CM

## 2018-11-17 NOTE — Progress Notes (Signed)
   THERAPY PROGRESS NOTE  Session Time: 3:30-4:00  Participation Level: Active  Behavioral Response: Casual and NeatAlertAnxious  Type of Therapy: Individual Therapy  Treatment Goals addressed: Anxiety  Interventions: Solution Focused  Summary: Rebecca Avila is a 53 y.o. female who presents with anxiety and intermittent insomnia. Patient reports she is not sleeping well, and her sleep is "choppy".  Patient states she is waking up every two hours and is unable to get back to sleep quickly.  Patient states she has been helping her friend with funeral arrangements and this has helped keep her mind busy.  Patient reports she is still having bouts of anxiety and has decided she will not take the Zoloft, Dr. Amil Amen prescribed because she is concerned with the side-effects and how the medication affected her father.  Patient reports she is trying to develop a sleep routine and purchased organic tea to help with relaxation before bed, patient states she would like to try melatonin to help with sleep.  Patient reports she has stopped napping during the day to help with her sleep routine but has not noticed a difference.  Patient states she has continued to apply for virtual position and is not currently considering in-office positions because she is afraid of COVID-19. Patient states she is still walking regularly and has increased her distance since last session. Patient states walking does help her with anxiety symptoms.  Suicidal/Homicidal: Nowithout intent/plan  Therapist Response: The focus of today's session was on helping the patient create a sleep hygiene plan that would be effective long term. Patient was receptive to suggestions and understood she needed to do the sleep hygiene routine for 21 days to create a habit.   General appearance/Behavior: Casual Eye contact: Good Motor behavior: Normal Speech: Normal Level of consciousness: Alert Mood: Anxious Affect: Appropriate Anxiety level:  Moderate Thought process: Coherent Thought content: WNL Perception: Normal Judgment: Fair Insight: Present, Fair  Plan: Return again in , Fair weeks.  Diagnosis:      Adelene Amas, Latanya Presser 11/17/2018

## 2018-11-22 NOTE — Progress Notes (Signed)
   THERAPY PROGRESS NOTE  Session Time: 3:30 -4:30   Participation Level: Active  Behavioral Response: Casual and NeatAlertAnxious  Type of Therapy: Individual Therapy  Treatment Goals addressed: Anxiety and Coping  Interventions: Strength-based, Meditation: meditation for sleep hygiene and Reframing  Summary: Rebecca Avila is a 53 y.o. female who presents with anxiety and panic attacks.  Patient reports she is doing better and has been managing her anxiety well.  Patient states she is walking outside for exercise and when she's anxious.  Patient states she went to friend's birthday dinner at a restaurant was able to manage anxiety about getting sick using reframing skills. Patient reports her fear of getting sick with COVID has not reduced but she is managing concerns by using evidence and facts to guide her decisions and thoughts. Patient states she has been sleeping better but is still having difficulty staying asleep and is not following good sleep hygiene. 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   Suicidal/Homicidal: Nowithout intent/plan  Therapist Response:   The focus of today's session was on encouraging patient to continue using facts and evidence to manage stressors and triggers. The main therapeutic techniques used were to help patient recognize strengths by identifying positive management skills, using reframing and facts/evidence to help manage concerns and fears, and using sleep meditation techniques to help with long-term sleep and maintain sleep hyigene routiune. Therapeutic efforts also included aiding the patient in identifying unproductive feelings and thoughts and how to manage them before they increase her anxiety.    Mental status General appearance/Behavior: Neat and Casual Eye contact: Good Motor behavior: Normal Speech: Normal, Rate:WNL and Volume:WNL Level of consciousness: Alert Mood: Anxious and  Depressed Affect: Appropriate and Depressed Anxiety level: Moderate Thought process: Coherent Thought content: WNL Perception: Normal Judgment: Fair Insight: Present;Fair  Plan: Return again in 2 weeks.  Diagnosis: Generalized anxiety Disorder with Panic Attacks, Major Depressive Disorder; moderate    Adelene Amas, LCSWA 11/22/2018

## 2018-12-06 ENCOUNTER — Other Ambulatory Visit (INDEPENDENT_AMBULATORY_CARE_PROVIDER_SITE_OTHER): Payer: Self-pay

## 2018-12-06 ENCOUNTER — Other Ambulatory Visit: Payer: Self-pay

## 2018-12-06 VITALS — BP 154/98 | HR 70

## 2018-12-06 DIAGNOSIS — I10 Essential (primary) hypertension: Secondary | ICD-10-CM

## 2018-12-06 DIAGNOSIS — E1165 Type 2 diabetes mellitus with hyperglycemia: Secondary | ICD-10-CM

## 2018-12-06 DIAGNOSIS — Z794 Long term (current) use of insulin: Secondary | ICD-10-CM

## 2018-12-07 LAB — HGB A1C W/O EAG: Hgb A1c MFr Bld: 6.4 % — ABNORMAL HIGH (ref 4.8–5.6)

## 2018-12-08 ENCOUNTER — Telehealth: Payer: Self-pay | Admitting: Licensed Clinical Social Worker

## 2018-12-09 ENCOUNTER — Telehealth: Payer: Self-pay | Admitting: Internal Medicine

## 2019-01-18 ENCOUNTER — Telehealth: Payer: Self-pay | Admitting: Internal Medicine

## 2019-01-18 NOTE — Telephone Encounter (Signed)
Pt. Calling to report to Dr. Amil Amen that she has been having numbness on the bottom of her right leg for about a month but since this Monday it has been continuous. Also having left leg numbness right above her knee for about a year that comes and goes .Marland Kitchen   Would like advise on what to do

## 2019-01-23 ENCOUNTER — Telehealth: Payer: Self-pay | Admitting: Licensed Clinical Social Worker

## 2019-01-23 NOTE — Telephone Encounter (Signed)
Contacted patient to inform patient of [facility] last day of employment, to terminate services and give options for continuation of care. Options for continuation of care/termination are:  . Place patient on Wait List to make an appointment with one the [employer] interns, Rudene Anda or Danae Chen in January 2021. Marland Kitchen Place patient on wait list for new LCSW. Marland Kitchen Refer patient to outside therapist or agency. . Termination of mental health service with the clinic, at this time  Patient requested to be placed on a waiting list for [employer] intern Liana.

## 2019-01-24 NOTE — Telephone Encounter (Signed)
Spoke with patient. Advised she needs appointment in the office to be seen. Patient states he will call me back to see if she can get ride to come in today at 4 pm.

## 2019-01-25 ENCOUNTER — Other Ambulatory Visit: Payer: Self-pay

## 2019-01-25 ENCOUNTER — Ambulatory Visit (INDEPENDENT_AMBULATORY_CARE_PROVIDER_SITE_OTHER): Payer: Self-pay | Admitting: Internal Medicine

## 2019-01-25 ENCOUNTER — Encounter: Payer: Self-pay | Admitting: Internal Medicine

## 2019-01-25 VITALS — BP 158/100 | HR 88 | Resp 12 | Ht 66.5 in | Wt 324.0 lb

## 2019-01-25 DIAGNOSIS — F411 Generalized anxiety disorder: Secondary | ICD-10-CM

## 2019-01-25 DIAGNOSIS — E1169 Type 2 diabetes mellitus with other specified complication: Secondary | ICD-10-CM | POA: Insufficient documentation

## 2019-01-25 DIAGNOSIS — R202 Paresthesia of skin: Secondary | ICD-10-CM

## 2019-01-25 DIAGNOSIS — M2550 Pain in unspecified joint: Secondary | ICD-10-CM

## 2019-01-25 DIAGNOSIS — E210 Primary hyperparathyroidism: Secondary | ICD-10-CM

## 2019-01-25 DIAGNOSIS — I10 Essential (primary) hypertension: Secondary | ICD-10-CM

## 2019-01-25 DIAGNOSIS — Z79899 Other long term (current) drug therapy: Secondary | ICD-10-CM

## 2019-01-25 DIAGNOSIS — F410 Panic disorder [episodic paroxysmal anxiety] without agoraphobia: Secondary | ICD-10-CM

## 2019-01-25 DIAGNOSIS — M7062 Trochanteric bursitis, left hip: Secondary | ICD-10-CM

## 2019-01-25 DIAGNOSIS — F331 Major depressive disorder, recurrent, moderate: Secondary | ICD-10-CM

## 2019-01-25 DIAGNOSIS — E782 Mixed hyperlipidemia: Secondary | ICD-10-CM

## 2019-01-25 DIAGNOSIS — R200 Anesthesia of skin: Secondary | ICD-10-CM | POA: Insufficient documentation

## 2019-01-25 MED ORDER — CITALOPRAM HYDROBROMIDE 10 MG PO TABS: 10.0000 mg | ORAL_TABLET | Freq: Every day | ORAL | 1 refills | Status: DC

## 2019-01-25 MED ORDER — LISINOPRIL 40 MG PO TABS: ORAL_TABLET | ORAL | 11 refills | Status: DC

## 2019-01-25 MED ORDER — ACETAMINOPHEN 500 MG PO TABS: ORAL_TABLET | ORAL | 0 refills | Status: DC

## 2019-01-25 NOTE — Progress Notes (Signed)
Subjective:    Patient ID: Rebecca Avila, female   DOB: 1965/07/02, 53 y.o.   MRN: [id number]   HPI   Here for acute visit  1.  Getting aches and pains: Left elbow feels bruised.  Knees with pain going up and down stairs.  Does walk a lot to get to store or in apt complex. History of what sounds like Baker's cyst in the past, though do not see any radiologic evaluation that suggests this as she describes.  She does have an xray of right knee from 2011 that shows a small effusion with preserved joint spaces.  2.  If standing a long time, gets numbness on left anterior thigh.  Started on just lateral thigh.  Right leg with numbness as well--started with both thigh and lower leg, but now just circumferential of lower leg.   Has had the left symptoms starting a couple of years ago.  She has had low back pain and shooting pain down into lateral left thigh.  Gets the pain now 2-3 days out of week.   No pain in right leg. These symptoms are  More prominent with long time standing. Describes a greater trochanteric injection years ago when living in [facility].  This helped with the pain on the left. Lumbar spine film from 2005 essentially unremarkable.  3.  Hot flashes and night sweats.  History of vaginal hysterectomy, but ovaries left around 2007.    4.  Anxiety with going into places with COVID19.  Working with [employer], [place].  Never started Sertraline as her father had a bad reaction to it.  Willing to consider another medication.  5.  Hyperlipidemia:  Fasting today.  Started Simvastatin after August labs.     6.  Hypertension:  Taking amlodipine and Lisinopril      Current Meds  Medication Sig  . acetaminophen (TYLENOL) 325 MG tablet Take 650 mg by mouth every 6 (six) hours as needed for moderate pain or headache.  Marland Kitchen amLODipine (NORVASC) 10 MG tablet Take 1 tablet (10 mg total) by mouth daily.  . fluconazole (DIFLUCAN) 150 MG tablet 1 tab by mouth daily for 3 days.  Marland Kitchen  glipiZIDE (GLUCOTROL) 5 MG tablet Take 1 tablet (5 mg total) by mouth 2 (two) times daily before a meal.  . lisinopril (ZESTRIL) 20 MG tablet 1 tab by mouth daily.  . metFORMIN (GLUCOPHAGE-XR) 500 MG 24 hr tablet 1 tab by mouth twice daily with meals  . simvastatin (ZOCOR) 40 MG tablet 1 tab by mouth with evening meal daily   Allergies  Allergen Reactions  . Aspirin Anaphylaxis  . Ibuprofen Anaphylaxis     Review of Systems    Objective:   BP (!) 158/100 (BP Location: Left Arm, Cuff Size: Large)   Pulse 88   Resp 12   Ht 5' 6.5" (1.689 m)   Wt (!) 324 lb (147 kg)   BMI 51.51 kg/m   Physical Exam  NAD Morbidly obese. Lungs:  CTA CV:  RRR without murmur or rub.  Radial and DP pulses normal and equal Neuro: LE:   Inconsistent with soft touch, but unable to find any deficits today.  Motor 5/5, DTRs 2+/4 throughout.  Gait normal MS:  Mild point tenderness over mid lumbar spinous process.  Moderate tenderness over left greater trochanter, which reproduces pain down her leg. Tender over olecranon process of right elbow, no effusion or bruising.   Assessment & Plan   1.  Multiple musculoskeletal  complaints:  Not clear how much of this is due to hyperparathyroidism.  Limited with physical activity currently due to COVID19.  Recommended looking into [facility] for water exercise/walking in pool without causing knee or ankle pain. For now--to see about access to stationary bike. Left lateral leg pain may be due to Lumbar nerve impingement, but more likely due to greater trochanteric bursitis. Allergy to NSAIDS Tylenol 1000 mg twice daily for now.  2.  Hypertension:  Some of this likely due to hyperparathyroidism, but also obesity.  Increase Lisinopril to 20 mg twice daily.  BMP in 2 weeks--will schedule following virtual visit in 1 week.  3.  Anxiety/panic disorder:  Citalopram 10 mg daily.  Virtual visit in 1 week.  Call if any concerns.  Has been working with  [employer], who is leaving soon.  4.  Primary hyperparathyroidism:  Will be getting DEXA next month.  Should be first up for a Gen surgery slot as well in January.  5.  Hyperlipidemia:  On Simvastatin.  FLP, CMP today.

## 2019-01-26 LAB — COMPREHENSIVE METABOLIC PANEL
ALT: 7 IU/L (ref 0–32)
AST: 8 IU/L (ref 0–40)
Albumin/Globulin Ratio: 1.4 (ref 1.2–2.2)
Albumin: 4.2 g/dL (ref 3.8–4.9)
Alkaline Phosphatase: 129 IU/L — ABNORMAL HIGH (ref 39–117)
BUN/Creatinine Ratio: 11 (ref 9–23)
BUN: 9 mg/dL (ref 6–24)
Bilirubin Total: 0.2 mg/dL (ref 0.0–1.2)
CO2: 27 mmol/L (ref 20–29)
Calcium: 12.5 mg/dL — ABNORMAL HIGH (ref 8.7–10.2)
Chloride: 104 mmol/L (ref 96–106)
Creatinine, Ser: 0.83 mg/dL (ref 0.57–1.00)
GFR calc Af Amer: 94 mL/min/{1.73_m2} (ref >59)
GFR calc non Af Amer: 81 mL/min/{1.73_m2} (ref >59)
Globulin, Total: 3 g/dL (ref 1.5–4.5)
Glucose: 70 mg/dL (ref 65–99)
Potassium: 4.5 mmol/L (ref 3.5–5.2)
Sodium: 143 mmol/L (ref 134–144)
Total Protein: 7.2 g/dL (ref 6.0–8.5)

## 2019-01-26 LAB — LIPID PANEL W/O CHOL/HDL RATIO
Cholesterol, Total: 141 mg/dL (ref 100–199)
HDL: 53 mg/dL (ref >39)
LDL Chol Calc (NIH): 72 mg/dL (ref 0–99)
Triglycerides: 85 mg/dL (ref 0–149)
VLDL Cholesterol Cal: 16 mg/dL (ref 5–40)

## 2019-01-27 DIAGNOSIS — E210 Primary hyperparathyroidism: Secondary | ICD-10-CM

## 2019-01-27 DIAGNOSIS — M8580 Other specified disorders of bone density and structure, unspecified site: Secondary | ICD-10-CM

## 2019-01-27 HISTORY — DX: Primary hyperparathyroidism: E21.0

## 2019-01-27 HISTORY — DX: Other specified disorders of bone density and structure, unspecified site: M85.80

## 2019-02-01 ENCOUNTER — Telehealth (INDEPENDENT_AMBULATORY_CARE_PROVIDER_SITE_OTHER): Payer: Self-pay | Admitting: Internal Medicine

## 2019-02-01 ENCOUNTER — Other Ambulatory Visit: Payer: Self-pay

## 2019-02-01 DIAGNOSIS — F411 Generalized anxiety disorder: Secondary | ICD-10-CM

## 2019-02-01 DIAGNOSIS — F410 Panic disorder [episodic paroxysmal anxiety] without agoraphobia: Secondary | ICD-10-CM

## 2019-02-01 DIAGNOSIS — R058 Other specified cough: Secondary | ICD-10-CM

## 2019-02-01 DIAGNOSIS — Z87892 Personal history of anaphylaxis: Secondary | ICD-10-CM

## 2019-02-01 DIAGNOSIS — R05 Cough: Secondary | ICD-10-CM

## 2019-02-01 DIAGNOSIS — T464X5A Adverse effect of angiotensin-converting-enzyme inhibitors, initial encounter: Secondary | ICD-10-CM

## 2019-02-01 MED ORDER — AGAMATRIX PRESTO TEST VI STRP: ORAL_STRIP | 11 refills | Status: DC

## 2019-02-01 MED ORDER — AGAMATRIX PRESTO W/DEVICE KIT: PACK | 0 refills | Status: DC

## 2019-02-01 MED ORDER — EPINEPHRINE 0.3 MG/0.3ML IJ SOAJ: 0.3000 mg | INTRAMUSCULAR | 1 refills | Status: DC | PRN

## 2019-02-01 MED ORDER — LOSARTAN POTASSIUM 100 MG PO TABS: 100.0000 mg | ORAL_TABLET | Freq: Every day | ORAL | 11 refills | Status: DC

## 2019-02-01 MED ORDER — AGAMATRIX ULTRA-THIN LANCETS MISC: 11 refills | Status: DC

## 2019-02-01 NOTE — Progress Notes (Signed)
    Subjective:    Patient ID: Rebecca Avila, female   DOB: February 01, 1965, 54 y.o.   MRN: [id number]   HPI   1  Anxiety/panic:  Did not feel any different when she took the Citalopram for 3 days, so stopped.  2.  Coughing all the time.  Tickle in her throat.  Started after increasing her Lisinopril to 40 mg daily after last visit.  She is taking 20 mg twice daily.    Has not picked up glucometer testing supplies.  Needs an epipen.  Current Meds  Medication Sig  . acetaminophen (TYLENOL) 500 MG tablet 2 tabs by mouth twice daily  . amLODipine (NORVASC) 10 MG tablet Take 1 tablet (10 mg total) by mouth daily.  Marland Kitchen glipiZIDE (GLUCOTROL) 5 MG tablet Take 1 tablet (5 mg total) by mouth 2 (two) times daily before a meal.  . lisinopril (ZESTRIL) 40 MG tablet 1/2 tab by mouth twice daily.  . metFORMIN (GLUCOPHAGE-XR) 500 MG 24 hr tablet 1 tab by mouth twice daily with meals  . simvastatin (ZOCOR) 40 MG tablet 1 tab by mouth with evening meal daily   Allergies  Allergen Reactions  . Aspirin Anaphylaxis  . Ibuprofen Anaphylaxis     Review of Systems    Objective:   There were no vitals taken for this visit.  Physical Exam  NAD   Assessment & Plan   1.  Panic/anxiety/agoraphobia:  Discussed again that Citalopram needs to be taken regularly and may not note improvement for up to 6 weeks, but possibly as early as 2 weeks.   Also to be taken daily at same time and not as needed.  2.  Probable ACE I cough.  Switch from Lisinopril to Losartan 100 mg daily.    3.  History of anaphylaxis:  Rx for Epipen at [facility].  4.  DM:  Resend Glucometer and equipment.

## 2019-02-09 ENCOUNTER — Telehealth: Payer: Self-pay | Admitting: Internal Medicine

## 2019-02-17 ENCOUNTER — Telehealth: Payer: Self-pay | Admitting: Internal Medicine

## 2019-02-20 ENCOUNTER — Telehealth: Payer: Self-pay | Admitting: Internal Medicine

## 2019-02-21 ENCOUNTER — Ambulatory Visit
Admission: RE | Admit: 2019-02-21 | Discharge: 2019-02-21 | Disposition: A | Discharge status: Home / Self Care | Payer: PRIVATE HEALTH INSURANCE | Source: Ambulatory Visit | Attending: Internal Medicine | Admitting: Internal Medicine

## 2019-02-21 ENCOUNTER — Other Ambulatory Visit: Payer: Self-pay

## 2019-02-21 DIAGNOSIS — E210 Primary hyperparathyroidism: Secondary | ICD-10-CM

## 2019-02-23 ENCOUNTER — Other Ambulatory Visit (HOSPITAL_COMMUNITY): Payer: Self-pay | Admitting: Surgery

## 2019-02-23 ENCOUNTER — Other Ambulatory Visit: Payer: Self-pay | Admitting: Surgery

## 2019-02-23 DIAGNOSIS — E210 Primary hyperparathyroidism: Secondary | ICD-10-CM

## 2019-02-24 ENCOUNTER — Encounter: Payer: Self-pay | Admitting: Internal Medicine

## 2019-02-24 ENCOUNTER — Other Ambulatory Visit: Payer: Self-pay

## 2019-02-24 ENCOUNTER — Telehealth (INDEPENDENT_AMBULATORY_CARE_PROVIDER_SITE_OTHER): Payer: Self-pay | Admitting: Internal Medicine

## 2019-02-24 VITALS — BP 146/88

## 2019-02-24 DIAGNOSIS — T464X5A Adverse effect of angiotensin-converting-enzyme inhibitors, initial encounter: Secondary | ICD-10-CM

## 2019-02-24 DIAGNOSIS — I10 Essential (primary) hypertension: Secondary | ICD-10-CM

## 2019-02-24 DIAGNOSIS — M898X9 Other specified disorders of bone, unspecified site: Secondary | ICD-10-CM

## 2019-02-24 DIAGNOSIS — E1100 Type 2 diabetes mellitus with hyperosmolarity without nonketotic hyperglycemic-hyperosmolar coma (NKHHC): Secondary | ICD-10-CM

## 2019-02-24 DIAGNOSIS — R05 Cough: Secondary | ICD-10-CM

## 2019-02-24 DIAGNOSIS — E210 Primary hyperparathyroidism: Secondary | ICD-10-CM

## 2019-02-24 DIAGNOSIS — F331 Major depressive disorder, recurrent, moderate: Secondary | ICD-10-CM

## 2019-02-24 DIAGNOSIS — F411 Generalized anxiety disorder: Secondary | ICD-10-CM

## 2019-02-24 DIAGNOSIS — F410 Panic disorder [episodic paroxysmal anxiety] without agoraphobia: Secondary | ICD-10-CM

## 2019-02-24 MED ORDER — CITALOPRAM HYDROBROMIDE 10 MG PO TABS: 10.0000 mg | ORAL_TABLET | Freq: Every day | ORAL | 1 refills | Status: DC

## 2019-02-24 MED ORDER — TRAMADOL HCL 50 MG PO TABS: ORAL_TABLET | ORAL | 0 refills | Status: DC

## 2019-02-24 MED ORDER — LOSARTAN POTASSIUM 100 MG PO TABS: 100.0000 mg | ORAL_TABLET | Freq: Every day | ORAL | 3 refills | Status: DC

## 2019-02-24 NOTE — Progress Notes (Signed)
    Subjective:    Patient ID: Rebecca Avila, female   DOB: 1965/07/26, 54 y.o.   MRN: [id number]   HPI   1.  Cough:  Resolved for most part with switch to Losartan from Lisinopril.  Occasional cough still, but has only been 2 weeks. BP at surgical office yesterday was 146/88.    2.  Depression/Anxiety: Taking Citalopram 10 mg daily now for past 2 weeks.  Does not feel as anxious as before starting.  3.  Back/elbow and knee pain:  Tylenol 1000 mg twice daily is nt helping.  4.  Hyperparathyroidism:  Did see Gen Surgery/Dr. Harlow Avila yesterday and they are doing 24 hour urine collection and planning parathyroid scan.   Her DEXA recently returned showing osteopenia as well.   Current Meds  Medication Sig  . acetaminophen (TYLENOL) 500 MG tablet 2 tabs by mouth twice daily  . AgaMatrix Ultra-Thin Lancets MISC Check blood sugars twice daily before meals  . amLODipine (NORVASC) 10 MG tablet Take 1 tablet (10 mg total) by mouth daily.  . Blood Glucose Monitoring Suppl (AGAMATRIX PRESTO) w/Device KIT Check blood sugar twice daily before meals  . citalopram (CELEXA) 10 MG tablet Take 1 tablet (10 mg total) by mouth daily.  Marland Kitchen glipiZIDE (GLUCOTROL) 5 MG tablet Take 1 tablet (5 mg total) by mouth 2 (two) times daily before a meal.  . glucose blood (AGAMATRIX PRESTO TEST) test strip Check blood sugars twice daily before meals  . losartan (COZAAR) 100 MG tablet Take 1 tablet (100 mg total) by mouth daily.  . metFORMIN (GLUCOPHAGE-XR) 500 MG 24 hr tablet 1 tab by mouth twice daily with meals  . simvastatin (ZOCOR) 40 MG tablet 1 tab by mouth with evening meal daily   Allergies  Allergen Reactions  . Aspirin Anaphylaxis  . Ibuprofen Anaphylaxis  . Ace Inhibitors Cough     Review of Systems    Objective:   There were no vitals taken for this visit.  146/88 yesterday at Gen Surgery appt  Looks well  Physical Exam   Assessment & Plan   1.  ACE I cough:  Much improved short term with  switch to Losartan.  BP control fair.  Follow up in 6 weeks for check in office.  2.  Anxiety/Depression:  Doing better with daily use of Citalopram.  Refilled.  Follow up in 6 weeks.  3.  Anaphylaxis to NSAIDs:  Has not completed paper work with [facility].  Costs $40 for a cab ride to [place].  Will check into [place].  Moving all of her meds to [place].    4.  DM:  Problems with glucometer reading error--she will bring in for next visit.  Discussed may not be getting enough blood in the test strip.  Discussed diabetic supplies will be much more expensive if not using [facility].  5.  Bone/joint pain:  Suspect some of this due to Hyperparathyroidism:  She is very allergic to NSAIDS and Tylenol not helping. Discussed we would try low dose Tramadol for a limited time until hyperparathyroidism issue resolved.  6.  Primary hyperparathyroidism:  With osteopenia and hypercalcemia:  Has been evaluated by Dr. Harlow Avila.  Await 24 hour urine collection results and parathyroid scan.

## 2019-03-08 ENCOUNTER — Ambulatory Visit (HOSPITAL_COMMUNITY)
Admission: RE | Admit: 2019-03-08 | Discharge: 2019-03-08 | Disposition: A | Discharge status: Home / Self Care | Payer: Self-pay | Source: Ambulatory Visit | Attending: Surgery | Admitting: Surgery

## 2019-03-08 ENCOUNTER — Encounter (HOSPITAL_COMMUNITY)
Admission: RE | Admit: 2019-03-08 | Discharge: 2019-03-08 | Disposition: A | Discharge status: Home / Self Care | Payer: Self-pay | Source: Ambulatory Visit | Attending: Surgery | Admitting: Surgery

## 2019-03-08 ENCOUNTER — Encounter (HOSPITAL_COMMUNITY)
Admission: RE | Admit: 2019-03-08 | Discharge: 2019-03-08 | Disposition: A | Discharge status: Home / Self Care | Payer: PRIVATE HEALTH INSURANCE | Source: Ambulatory Visit | Attending: Surgery | Admitting: Surgery

## 2019-03-08 ENCOUNTER — Other Ambulatory Visit: Payer: Self-pay

## 2019-03-08 DIAGNOSIS — E210 Primary hyperparathyroidism: Secondary | ICD-10-CM | POA: Insufficient documentation

## 2019-03-08 MED ORDER — TECHNETIUM TC 99M SESTAMIBI GENERIC - CARDIOLITE
25.7000 | Freq: Once | INTRAVENOUS | Status: AC | PRN
  Administered 2019-03-08: 25.7 via INTRAVENOUS

## 2019-03-09 ENCOUNTER — Other Ambulatory Visit: Payer: Self-pay | Admitting: Surgery

## 2019-03-09 DIAGNOSIS — E041 Nontoxic single thyroid nodule: Secondary | ICD-10-CM

## 2019-03-15 ENCOUNTER — Other Ambulatory Visit (HOSPITAL_COMMUNITY)
Admission: RE | Admit: 2019-03-15 | Discharge: 2019-03-15 | Disposition: A | Discharge status: Home / Self Care | Payer: PRIVATE HEALTH INSURANCE | Source: Ambulatory Visit | Attending: Surgery | Admitting: Surgery

## 2019-03-15 ENCOUNTER — Ambulatory Visit
Admission: RE | Admit: 2019-03-15 | Discharge: 2019-03-15 | Disposition: A | Discharge status: Home / Self Care | Payer: PRIVATE HEALTH INSURANCE | Source: Ambulatory Visit | Attending: Surgery | Admitting: Surgery

## 2019-03-15 DIAGNOSIS — E041 Nontoxic single thyroid nodule: Secondary | ICD-10-CM

## 2019-03-15 NOTE — Procedures (Signed)
PROCEDURE SUMMARY:  Using direct ultrasound guidance, 5 passes were made using 25 g needles into the nodule within the superior isthmus of the thyroid.   Ultrasound was used to confirm needle placements on all occasions.   The procedure was technically difficult secondary to patient's body habitus.  EBL = trace  Specimens were sent to Pathology for analysis.  See procedure note under Imaging tab in Epic for full procedure details.  Toniann Fail S Alhaji Mcneal PA-C 03/15/2019 2:53 PM

## 2019-03-17 LAB — CYTOLOGY - NON PAP

## 2019-04-03 ENCOUNTER — Ambulatory Visit: Payer: Self-pay | Admitting: Surgery

## 2019-04-07 ENCOUNTER — Ambulatory Visit: Payer: Self-pay | Admitting: Internal Medicine

## 2019-04-14 ENCOUNTER — Ambulatory Visit: Payer: Self-pay | Admitting: Internal Medicine

## 2019-07-21 ENCOUNTER — Other Ambulatory Visit: Payer: Self-pay | Admitting: Internal Medicine

## 2019-09-27 ENCOUNTER — Ambulatory Visit
Admission: EM | Admit: 2019-09-27 | Discharge: 2019-09-27 | Disposition: A | Discharge status: Home / Self Care | Payer: BC Managed Care – PPO | Attending: Emergency Medicine | Admitting: Emergency Medicine

## 2019-09-27 DIAGNOSIS — N3001 Acute cystitis with hematuria: Secondary | ICD-10-CM | POA: Insufficient documentation

## 2019-09-27 LAB — POCT URINALYSIS DIP (MANUAL ENTRY)
Bilirubin, UA: NEGATIVE
Glucose, UA: >=1000 mg/dL — AB
Ketones, POC UA: NEGATIVE mg/dL
Nitrite, UA: NEGATIVE
Protein Ur, POC: 30 mg/dL — AB
Spec Grav, UA: 1.02 (ref 1.010–1.025)
Urobilinogen, UA: 0.2 E.U./dL
pH, UA: 6 (ref 5.0–8.0)

## 2019-09-27 MED ORDER — NITROFURANTOIN MONOHYD MACRO 100 MG PO CAPS: 100.0000 mg | ORAL_CAPSULE | Freq: Two times a day (BID) | ORAL | 0 refills | Status: DC

## 2019-09-27 NOTE — ED Provider Notes (Signed)
[facility]    CSN: [id number] Arrival date & time: 09/27/19  3354      History   Chief Complaint Chief Complaint  Patient presents with  . Urinary Frequency    HPI Rebecca Avila is a 54 y.o. female   Presenting for possible UTI.  Endorses history thereof: States it feels similar.  Having urgency with frequency for the last 2 days.  No abdominal pain, nausea, vomiting.  Denies history of pyelonephritis.  Reports compliance with diabetic medications.  No vaginal discharge or pain.  Past Medical History:  Diagnosis Date  . Diabetes mellitus without complication ([place])   . Environmental allergies   . Hypercalcemia 2014   2020--significant increase in calcium/ionized calcium with elevated PTH--hyperparathyroidism  . Hypertension   . Sciatica     Patient Active Problem List   Diagnosis Date Noted  . History of drug-induced anaphylaxis 02/01/2019  . ACE-inhibitor cough 02/01/2019  . Mixed hyperlipidemia 01/25/2019  . Greater trochanteric bursitis of left hip 01/25/2019  . Numbness and tingling of both legs 01/25/2019  . Generalized anxiety disorder with panic attacks 10/21/2018  . Hyperparathyroidism, primary ([place]) 07/16/2014  . Uncontrolled type 2 diabetes mellitus with hyperosmolar nonketotic hyperglycemia ([place]) 07/13/2014  . Essential hypertension 07/13/2014  . Morbid obesity with BMI of 50.0-59.9, adult ([place]) 07/13/2014  . RBBB 07/13/2014  . Thrush, oral 07/13/2014    Past Surgical History:  Procedure Laterality Date  . PARTIAL HYSTERECTOMY      OB History   No obstetric history on file.      Home Medications    Prior to Admission medications   Medication Sig Start Date End Date Taking? Authorizing Provider  acetaminophen (TYLENOL) 500 MG tablet 2 tabs by mouth twice daily 01/25/19   Mack Hook, MD  AgaMatrix Ultra-Thin Lancets MISC Check blood sugars twice daily before meals 02/01/19   Mack Hook, MD  amLODipine (NORVASC) 10 MG  tablet Take 1 tablet (10 mg total) by mouth daily. 10/24/18   Mack Hook, MD  Blood Glucose Monitoring Suppl (AGAMATRIX PRESTO) w/Device KIT Check blood sugar twice daily before meals 02/01/19   Mack Hook, MD  EPINEPHrine (EPIPEN 2-PAK) 0.3 mg/0.3 mL IJ SOAJ injection Inject 0.3 mLs (0.3 mg total) into the muscle as needed for anaphylaxis. Patient not taking: Reported on 02/24/2019 02/01/19   Mack Hook, MD  glipiZIDE (GLUCOTROL) 5 MG tablet Take 1 tablet (5 mg total) by mouth 2 (two) times daily before a meal. 09/07/18   Mack Hook, MD  glucose blood (AGAMATRIX PRESTO TEST) test strip Check blood sugars twice daily before meals 02/01/19   Mack Hook, MD  losartan (COZAAR) 100 MG tablet Take 1 tablet by mouth once daily 07/21/19   Mack Hook, MD  metFORMIN (GLUCOPHAGE-XR) 500 MG 24 hr tablet 1 tab by mouth twice daily with meals 09/07/18   Mack Hook, MD  nitrofurantoin, macrocrystal-monohydrate, (MACROBID) 100 MG capsule Take 1 capsule (100 mg total) by mouth 2 (two) times daily. 09/27/19   Hall-Potvin, [place], PA-C  simvastatin (ZOCOR) 40 MG tablet 1 tab by mouth with evening meal daily 09/21/18   Mack Hook, MD    Family History Family History  Problem Relation Age of Onset  . High blood pressure Father   . High blood pressure Paternal Grandmother   . High blood pressure Paternal Aunt   . Lung cancer Mother   . Cancer Brother        congenital tumor  . Diabetes Neg Hx   .  Thyroid disease Neg Hx     Social History Social History   Tobacco Use  . Smoking status: Light Tobacco Smoker    Packs/day: 0.00    Types: Cigarettes    Start date: 01/27/1992  . Smokeless tobacco: Never Used  Vaping Use  . Vaping Use: Never used  Substance Use Topics  . Alcohol use: Yes    Comment: occ  . Drug use: No     Allergies   Aspirin, Ibuprofen, and Ace inhibitors   Review of Systems As per HPI   Physical Exam Triage Vital  Signs ED Triage Vitals  Enc Vitals Group     BP 09/27/19 0909 (!) 164/103     Pulse Rate 09/27/19 0909 80     Resp 09/27/19 0909 18     Temp 09/27/19 0909 97.7 F (36.5 C)     Temp Source 09/27/19 0909 Oral     SpO2 09/27/19 0909 92 %     Weight --      Height --      Head Circumference --      Peak Flow --      Pain Score 09/27/19 0945 8     Pain Loc --      Pain Edu? --      Excl. in [place]? --    No data found.  Updated Vital Signs BP (!) 164/103 (BP Location: Left Arm)   Pulse 80   Temp 97.7 F (36.5 C) (Oral)   Resp 18   SpO2 92%   Visual Acuity Right Eye Distance:   Left Eye Distance:   Bilateral Distance:    Right Eye Near:   Left Eye Near:    Bilateral Near:     Physical Exam Constitutional:      General: She is not in acute distress. HENT:     Head: Normocephalic and atraumatic.  Eyes:     General: No scleral icterus.    Pupils: Pupils are equal, round, and reactive to light.  Cardiovascular:     Rate and Rhythm: Normal rate.  Pulmonary:     Effort: Pulmonary effort is normal.  Abdominal:     General: Bowel sounds are normal.     Palpations: Abdomen is soft.     Tenderness: There is no abdominal tenderness. There is no right CVA tenderness, left CVA tenderness or guarding.  Skin:    Coloration: Skin is not jaundiced or pale.  Neurological:     Mental Status: She is alert and oriented to person, place, and time.      UC Treatments / Results  Labs (all labs ordered are listed, but only abnormal results are displayed) Labs Reviewed  POCT URINALYSIS DIP (MANUAL ENTRY) - Abnormal; Notable for the following components:      Result Value   Clarity, UA cloudy (*)    Glucose, UA >=1,000 (*)    Blood, UA moderate (*)    Protein Ur, POC =30 (*)    Leukocytes, UA Moderate (2+) (*)    All other components within normal limits  URINE CULTURE    EKG   Radiology No results found.  Procedures Procedures (including critical care  time)  Medications Ordered in UC Medications - No data to display  Initial Impression / Assessment and Plan / UC Course  I have reviewed the triage vital signs and the nursing notes.  Pertinent labs & imaging results that were available during my care of the patient were reviewed by me and  considered in my medical decision making (see chart for details).     Patient appears well in office today.  Urine with abnormalities as outlined above.  Will start Macrobid, follow-up with PCP for further evaluation/management of chronic conditions such as diabetes, hypertension. Final Clinical Impressions(s) / UC Diagnoses   Final diagnoses:  Acute cystitis with hematuria     Discharge Instructions     Take antibiotic twice daily with food. Important to drink plenty of water throughout the day. May take Azo as needed for burning sensation. Return for worsening urinary symptoms, blood in urine, abdominal or back pain, fever.    ED Prescriptions    Medication Sig Dispense Auth. Provider   nitrofurantoin, macrocrystal-monohydrate, (MACROBID) 100 MG capsule Take 1 capsule (100 mg total) by mouth 2 (two) times daily. 10 capsule Hall-Potvin, [place], PA-C     PDMP not reviewed this encounter.   Hall-Potvin, [place], [place] 09/27/19 1046

## 2019-09-27 NOTE — ED Triage Notes (Signed)
Pt c/o constant feeling of having to urinate today. States has had frequency and incontinence x2 days.

## 2019-09-27 NOTE — Discharge Instructions (Signed)
Take antibiotic twice daily with food. °Important to drink plenty of water throughout the day. °May take Azo as needed for burning sensation. °Return for worsening urinary symptoms, blood in urine, abdominal or back pain, fever. °

## 2019-09-29 LAB — URINE CULTURE: Culture: >=100000 — AB

## 2019-11-06 ENCOUNTER — Encounter: Payer: Self-pay | Admitting: Internal Medicine

## 2019-11-06 ENCOUNTER — Ambulatory Visit (INDEPENDENT_AMBULATORY_CARE_PROVIDER_SITE_OTHER): Payer: BC Managed Care – PPO | Admitting: Internal Medicine

## 2019-11-06 VITALS — BP 170/100 | HR 72 | Resp 20 | Ht 67.25 in | Wt 327.0 lb

## 2019-11-06 DIAGNOSIS — E1100 Type 2 diabetes mellitus with hyperosmolarity without nonketotic hyperglycemic-hyperosmolar coma (NKHHC): Secondary | ICD-10-CM

## 2019-11-06 DIAGNOSIS — R002 Palpitations: Secondary | ICD-10-CM | POA: Diagnosis not present

## 2019-11-06 DIAGNOSIS — Z23 Encounter for immunization: Secondary | ICD-10-CM | POA: Diagnosis not present

## 2019-11-06 DIAGNOSIS — I10 Essential (primary) hypertension: Secondary | ICD-10-CM

## 2019-11-06 DIAGNOSIS — Z6841 Body Mass Index (BMI) 40.0 and over, adult: Secondary | ICD-10-CM

## 2019-11-06 DIAGNOSIS — F331 Major depressive disorder, recurrent, moderate: Secondary | ICD-10-CM | POA: Diagnosis not present

## 2019-11-06 DIAGNOSIS — E210 Primary hyperparathyroidism: Secondary | ICD-10-CM

## 2019-11-06 DIAGNOSIS — E782 Mixed hyperlipidemia: Secondary | ICD-10-CM

## 2019-11-06 MED ORDER — CITALOPRAM HYDROBROMIDE 10 MG PO TABS: 10.0000 mg | ORAL_TABLET | Freq: Every day | ORAL | 3 refills | Status: DC

## 2019-11-06 MED ORDER — ONETOUCH VERIO W/DEVICE KIT: PACK | 0 refills | Status: AC

## 2019-11-06 MED ORDER — LOSARTAN POTASSIUM 100 MG PO TABS: 100.0000 mg | ORAL_TABLET | Freq: Every day | ORAL | 3 refills | Status: DC

## 2019-11-06 MED ORDER — ONETOUCH ULTRASOFT LANCETS MISC: 11 refills | Status: DC

## 2019-11-06 MED ORDER — AMLODIPINE BESYLATE 10 MG PO TABS: 10.0000 mg | ORAL_TABLET | Freq: Every day | ORAL | 3 refills | Status: DC

## 2019-11-06 MED ORDER — GLIPIZIDE ER 10 MG PO TB24: 10.0000 mg | ORAL_TABLET | Freq: Every day | ORAL | 3 refills | Status: DC

## 2019-11-06 MED ORDER — ONETOUCH VERIO VI STRP: ORAL_STRIP | 11 refills | Status: DC

## 2019-11-06 MED ORDER — SIMVASTATIN 40 MG PO TABS: ORAL_TABLET | ORAL | 3 refills | Status: DC

## 2019-11-06 MED ORDER — METFORMIN HCL ER 500 MG PO TB24: ORAL_TABLET | ORAL | 3 refills | Status: DC

## 2019-11-06 NOTE — Patient Instructions (Signed)
Get influenza vaccine at [facility]  You have an appt with Dr. Gerrit Friends at [facility] on Friday, Nov 12th and need to arrive by 2:30 p.m.

## 2019-11-06 NOTE — Progress Notes (Signed)
Subjective:    Patient ID: Rebecca Avila, female   DOB: [birth year], 54 y.o.   MRN: [id number]   HPI  Lost to follow up after Jan 2021  1.  Had Acute Cystitis beginning of September.  Much better after 5 days course of Macrobid.    2.  Hyperparathyroidism:  Never heard back reportedly from Dr. Ardine Eng office.  She had the thyroid nodule FNA, which was benign.  Was reportedly to have surgery to remove adenoma following thyroid nodule findings.   Pt. With osteopenia, hypercalcemia, Elevated PTH, bony pain.  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  Not clear what 24 hour urine collection showed.   3.  Hypertension:  States she is taking all her meds.  Suspect she has missed intermittently as she should have run out.  4.  DM:  Sometimes misses her evening doses of Metformin and glipizide.  Getting better as setting an alarm.  5.  Hyperlipidemia:  Missing occasionally. Again, evening is when she tends to miss, but is improving.  6.  Occasional fluttering in chest when walks up stairs.  Lasts about 1 hour.Generally, wearing a mask when occurs.  Has never taken her pulse.  States it feels like "an air bubble"  In her chest when occurs.   7.  HM:  Has not had influenza vaccine.  Cannot recall last Td.  Did get her Pfizer vaccine.  8.  Depression/anxiety:  Took Citalopram for short period of time, but then did not follow up.  She recognizes she needs something.  Was fine with Citalopram.  Current Meds  Medication Sig  . acetaminophen (TYLENOL) 500 MG tablet 2 tabs by mouth twice daily  . amLODipine (NORVASC) 10 MG tablet Take 1 tablet (10 mg total) by mouth daily.  Marland Kitchen glipiZIDE (GLUCOTROL) 5 MG tablet Take 1 tablet (5 mg total) by mouth 2 (two) times daily before a meal.  . losartan (COZAAR) 100 MG tablet Take 1 tablet by mouth once daily  . metFORMIN (GLUCOPHAGE-XR) 500 MG 24 hr tablet 1 tab by mouth twice daily with meals  . simvastatin (ZOCOR) 40 MG tablet 1 tab by mouth with  evening meal daily   Allergies  Allergen Reactions  . Aspirin Anaphylaxis  . Ibuprofen Anaphylaxis  . Ace Inhibitors Cough     Review of Systems    Objective:   BP (!) 170/100 (BP Location: Right Arm, Patient Position: Sitting, Cuff Size: Large)   Pulse 72   Resp 20   Ht 5' 7.25" (1.708 m)   Wt (!) 327 lb (148.3 kg)   BMI 50.84 kg/m   Physical Exam NAD HEENT:  PERRL, EOMI, TM pearly gray.  Throat without injection Neck:  Supple, No adenopathy, no thyromegaly Chest:  CTA CV:  RRR with normal S1 and S2 No S3, S4 or murmur.  No carotid bruits.  Carotid, radial and DP pulses normal and equal. Abd:  S, NT, No HSM or mass, + BS LE:  No edema.  ECG:  RBBB, sinus rhythm.  Unchanged from 2019  Assessment & Plan  1.  DM:  In general, suspect not working on lifestyle changes and missing meds regularly.  Will have her return for fasting labs, including A1C to get a better idea where she is.  Encouraged regular follow ups for her chronic issues.   2.   Palpitations:  ECG unchanged.  To call if symptoms worsen.    3.  Hypertension:  Poorly controlled.  As above,  suspect missing meds regularly and also with hyperparathyroidism, which can elevate bp as well.  4.  Hyperparathyroidism:  Checking with Dr. Ardine Eng office to get her back in line for surgery for parathyroid adenoma CMP with fasting labs. Addendum:  appt with Dr. Gerrit Friends Friday, Nov 12th  5.  Hyperlipidemia:  Fasting lipids in near future.  6.  Depression:  Restart Citalopram  7.  HM:  Td and pneumococcal 23 v today.  Will need COVID booster end of March 2022

## 2019-12-08 ENCOUNTER — Other Ambulatory Visit: Payer: BC Managed Care – PPO

## 2019-12-08 ENCOUNTER — Ambulatory Visit: Payer: Self-pay | Admitting: Surgery

## 2019-12-08 VITALS — BP 152/100 | HR 80

## 2019-12-08 DIAGNOSIS — E782 Mixed hyperlipidemia: Secondary | ICD-10-CM

## 2019-12-08 DIAGNOSIS — Z79899 Other long term (current) drug therapy: Secondary | ICD-10-CM

## 2019-12-08 DIAGNOSIS — E1100 Type 2 diabetes mellitus with hyperosmolarity without nonketotic hyperglycemic-hyperosmolar coma (NKHHC): Secondary | ICD-10-CM

## 2019-12-09 LAB — CBC WITH DIFFERENTIAL/PLATELET
Basophils Absolute: 0 10*3/uL (ref 0.0–0.2)
Basos: 1 %
EOS (ABSOLUTE): 0.1 10*3/uL (ref 0.0–0.4)
Eos: 2 %
Hematocrit: 41.6 % (ref 34.0–46.6)
Hemoglobin: 13.6 g/dL (ref 11.1–15.9)
Immature Grans (Abs): 0 10*3/uL (ref 0.0–0.1)
Immature Granulocytes: 0 %
Lymphocytes Absolute: 2.6 10*3/uL (ref 0.7–3.1)
Lymphs: 43 %
MCH: 26.5 pg — ABNORMAL LOW (ref 26.6–33.0)
MCHC: 32.7 g/dL (ref 31.5–35.7)
MCV: 81 fL (ref 79–97)
Monocytes Absolute: 0.3 10*3/uL (ref 0.1–0.9)
Monocytes: 5 %
Neutrophils Absolute: 3 10*3/uL (ref 1.4–7.0)
Neutrophils: 49 %
Platelets: 269 10*3/uL (ref 150–450)
RBC: 5.14 x10E6/uL (ref 3.77–5.28)
RDW: 13.4 % (ref 11.7–15.4)
WBC: 6.2 10*3/uL (ref 3.4–10.8)

## 2019-12-09 LAB — COMPREHENSIVE METABOLIC PANEL
ALT: 11 IU/L (ref 0–32)
AST: 12 IU/L (ref 0–40)
Albumin/Globulin Ratio: 1.5 (ref 1.2–2.2)
Albumin: 4.5 g/dL (ref 3.8–4.9)
Alkaline Phosphatase: 177 IU/L — ABNORMAL HIGH (ref 44–121)
BUN/Creatinine Ratio: 11 (ref 9–23)
BUN: 8 mg/dL (ref 6–24)
Bilirubin Total: 0.3 mg/dL (ref 0.0–1.2)
CO2: 23 mmol/L (ref 20–29)
Calcium: 12 mg/dL — ABNORMAL HIGH (ref 8.7–10.2)
Chloride: 109 mmol/L — ABNORMAL HIGH (ref 96–106)
Creatinine, Ser: 0.71 mg/dL (ref 0.57–1.00)
GFR calc Af Amer: 113 mL/min/{1.73_m2} (ref >59)
GFR calc non Af Amer: 98 mL/min/{1.73_m2} (ref >59)
Globulin, Total: 3 g/dL (ref 1.5–4.5)
Glucose: 89 mg/dL (ref 65–99)
Potassium: 4.3 mmol/L (ref 3.5–5.2)
Sodium: 145 mmol/L — ABNORMAL HIGH (ref 134–144)
Total Protein: 7.5 g/dL (ref 6.0–8.5)

## 2019-12-09 LAB — LIPID PANEL W/O CHOL/HDL RATIO
Cholesterol, Total: 127 mg/dL (ref 100–199)
HDL: 41 mg/dL (ref >39)
LDL Chol Calc (NIH): 65 mg/dL (ref 0–99)
Triglycerides: 113 mg/dL (ref 0–149)
VLDL Cholesterol Cal: 21 mg/dL (ref 5–40)

## 2019-12-09 LAB — HEMOGLOBIN A1C
Est. average glucose Bld gHb Est-mCnc: 223 mg/dL
Hgb A1c MFr Bld: 9.4 % — ABNORMAL HIGH (ref 4.8–5.6)

## 2019-12-09 LAB — MICROALBUMIN / CREATININE URINE RATIO
Creatinine, Urine: 64.1 mg/dL
Microalb/Creat Ratio: 65 mg/g creat — ABNORMAL HIGH (ref 0–29)
Microalbumin, Urine: 41.5 ug/mL

## 2020-02-07 ENCOUNTER — Encounter: Payer: Self-pay | Admitting: Internal Medicine

## 2020-02-07 ENCOUNTER — Other Ambulatory Visit: Payer: Self-pay

## 2020-02-07 ENCOUNTER — Ambulatory Visit: Payer: BC Managed Care – PPO | Admitting: Internal Medicine

## 2020-02-07 ENCOUNTER — Ambulatory Visit (INDEPENDENT_AMBULATORY_CARE_PROVIDER_SITE_OTHER): Payer: BC Managed Care – PPO | Admitting: Internal Medicine

## 2020-02-07 VITALS — BP 170/98 | HR 80 | Resp 16 | Ht 67.25 in | Wt 338.0 lb

## 2020-02-07 DIAGNOSIS — Z6841 Body Mass Index (BMI) 40.0 and over, adult: Secondary | ICD-10-CM

## 2020-02-07 DIAGNOSIS — E1100 Type 2 diabetes mellitus with hyperosmolarity without nonketotic hyperglycemic-hyperosmolar coma (NKHHC): Secondary | ICD-10-CM

## 2020-02-07 DIAGNOSIS — E782 Mixed hyperlipidemia: Secondary | ICD-10-CM

## 2020-02-07 DIAGNOSIS — E210 Primary hyperparathyroidism: Secondary | ICD-10-CM | POA: Diagnosis not present

## 2020-02-07 DIAGNOSIS — I10 Essential (primary) hypertension: Secondary | ICD-10-CM | POA: Diagnosis not present

## 2020-02-07 DIAGNOSIS — R002 Palpitations: Secondary | ICD-10-CM

## 2020-02-07 DIAGNOSIS — F331 Major depressive disorder, recurrent, moderate: Secondary | ICD-10-CM

## 2020-02-07 DIAGNOSIS — F410 Panic disorder [episodic paroxysmal anxiety] without agoraphobia: Secondary | ICD-10-CM

## 2020-02-07 DIAGNOSIS — Z1211 Encounter for screening for malignant neoplasm of colon: Secondary | ICD-10-CM

## 2020-02-07 DIAGNOSIS — F411 Generalized anxiety disorder: Secondary | ICD-10-CM

## 2020-02-07 DIAGNOSIS — Z1231 Encounter for screening mammogram for malignant neoplasm of breast: Secondary | ICD-10-CM

## 2020-02-07 MED ORDER — METOPROLOL SUCCINATE ER 50 MG PO TB24: 50.0000 mg | ORAL_TABLET | Freq: Every day | ORAL | 11 refills | Status: DC

## 2020-02-07 NOTE — Progress Notes (Signed)
Subjective:    Patient ID: Rebecca Avila, female   DOB: July 06, 1965, 55 y.o.   MRN: [id number]   HPI   1.  Hypertension:  States she is not missing her Amlodipine nor Losartan.   2.  Hyperparathyroidism:  Did see Dr. Dr Harlow Asa.  She needs to pay $557 before she can have the surgery.  She feels she will be able to do that end of January.   She is worried, however, about Omicron variant.  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   Works for [employer] on [address].   Not everyone wears a mask. She is vaccinated with [place], but not due for booster until end of March.   Patient with multiple risk factors for a poor outcome if she gets infected with COVID.    3.  Hypercholesterolemia:  Well controlled with Simvastatin.    4.  DM:  Was not eating well.  Has improved that and her sugars are down to high 100s max now.  Range is 70 to about 195.  Glucometer report supports this.  She is also exercising more.    5.  Depression:  Taking Citalopram, which keeps her calm, but very anxious about Omicron variant of COvID.    Current Meds  Medication Sig  . acetaminophen (TYLENOL) 500 MG tablet 2 tabs by mouth twice daily  . amLODipine (NORVASC) 10 MG tablet Take 1 tablet (10 mg total) by mouth daily.  . Blood Glucose Monitoring Suppl (ONETOUCH VERIO) w/Device KIT Check blood sugars twice daily before meals  . citalopram (CELEXA) 10 MG tablet Take 1 tablet (10 mg total) by mouth daily.  Marland Kitchen glipiZIDE (GLIPIZIDE XL) 10 MG 24 hr tablet Take 1 tablet (10 mg total) by mouth daily with breakfast.  . glucose blood (ONETOUCH VERIO) test strip Check blood glucose twice daily before meals  . Lancets (ONETOUCH ULTRASOFT) lancets Check blood sugar twice daily before meals  . losartan (COZAAR) 100 MG tablet Take 1 tablet (100 mg total) by mouth daily.  . metFORMIN (GLUCOPHAGE-XR) 500 MG 24 hr tablet 2 tabs by mouth with breakfast daily  . simvastatin (ZOCOR) 40 MG tablet 1 tab by mouth daily with  breakfast   Allergies  Allergen Reactions  . Aspirin Anaphylaxis  . Ibuprofen Anaphylaxis  . Ace Inhibitors Cough     Review of Systems    Objective:   BP (!) 170/98 (BP Location: Right Arm, Patient Position: Sitting, Cuff Size: Large)   Pulse 80   Resp 16   Ht 5' 7.25" (1.708 m)   Wt (!) 338 lb (153.3 kg)   BMI 52.55 kg/m   Physical Exam NAD HEENT:  PERRL, EOMI, TMs pearly gray Neck:  Supple, No adenopathy Chest:  CTA CV:  RRR without murmur or rub.  Radial and DP pulses normal and equal. Abd:  S, NT, No HSM or mass, + BS LE:  No edema.  Diabetic foot exam was performed with the following findings:   No deformities, ulcerations, or other skin breakdown Normal sensation of 10g monofilament Intact posterior tibialis and dorsalis pedis pulses     Assessment & Plan  1.  Hypertension with palpitations:  Add Metoprolol XL 50 mg to current meds of Losartan and Amlodipine. Nurse bp chek with pulse in 2 weeks.  2.  Hyperparathyroidism:  Await surgical plans  3.  Concern for covid exposure at work.  Letter written recommending she perform her data entry from home during  Omicron surge with her multiple risk factors and need for surgery. Referral for diabetic eye exam,  Dr. Katy Fitch.  4.  DM/obesity:  A1C end of February.  Control seems to be improving.  5.  Hyperlipidemia:  Has been well controlled.  At goal save for HDL in November.  6.  HM:  Mammogram.  Referral for screening colonoscopy to be scheduled out beyond recovery from neck surgery.  7.  Depression:  Controlled with Citalopram.

## 2020-02-21 ENCOUNTER — Encounter: Payer: Self-pay | Admitting: Gastroenterology

## 2020-03-11 ENCOUNTER — Other Ambulatory Visit: Payer: Self-pay | Admitting: Internal Medicine

## 2020-03-12 ENCOUNTER — Other Ambulatory Visit: Payer: Self-pay | Admitting: Internal Medicine

## 2020-03-12 MED ORDER — LOSARTAN POTASSIUM 50 MG PO TABS: ORAL_TABLET | ORAL | 10 refills | Status: DC

## 2020-03-19 ENCOUNTER — Telehealth: Payer: Self-pay | Admitting: Internal Medicine

## 2020-03-19 NOTE — Telephone Encounter (Signed)
Patient call to let you know that [facility] do not have her Rx slosartan (COZAAR) 50 MG tablet. Patient would like to request for you to change her appointment to any other pharmacy that accepts Good RX coupons. If we can please let her know where do you changed it to.

## 2020-03-20 ENCOUNTER — Telehealth: Payer: Self-pay | Admitting: *Deleted

## 2020-03-20 NOTE — Telephone Encounter (Signed)
Patient called, notified patient of Dr.Jacob's recommendations. Made OV w/pt for 4/5 at 1030 am. Colon and PV cancelled. Mailed appointment letter to pt. Patient is aware of all this.

## 2020-03-20 NOTE — Telephone Encounter (Signed)
Office visit to discuss other colon cancer screening options since she is at increased risk for colonoscopy given her morbid obesity

## 2020-03-20 NOTE — Telephone Encounter (Signed)
Dr.Jacobs,  This patient is a direct for a screening colonoscopy. Per last PCP OV on 02/07/2020 the patient's BMI was 52.55, weight 338 lbs. Patient has medical hx of DM,HTN, RBBB, anxiety and depression. Ok for direct hospital colonoscopy or OV? Please advise. Thank you, Kirstin Kugler pv

## 2020-03-21 MED ORDER — LOSARTAN POTASSIUM 50 MG PO TABS: ORAL_TABLET | ORAL | 10 refills | Status: DC

## 2020-03-21 NOTE — Addendum Note (Signed)
Addended by: Marcene Duos on: 03/21/2020 04:14 PM   Modules accepted: Orders

## 2020-03-22 NOTE — Telephone Encounter (Signed)
Patient confirmed that she was able to ger her prescription.

## 2020-03-25 ENCOUNTER — Other Ambulatory Visit (INDEPENDENT_AMBULATORY_CARE_PROVIDER_SITE_OTHER): Payer: BC Managed Care – PPO | Admitting: Internal Medicine

## 2020-03-25 ENCOUNTER — Other Ambulatory Visit: Payer: Self-pay

## 2020-03-25 VITALS — BP 150/88

## 2020-03-25 DIAGNOSIS — I10 Essential (primary) hypertension: Secondary | ICD-10-CM | POA: Diagnosis not present

## 2020-03-25 DIAGNOSIS — E1100 Type 2 diabetes mellitus with hyperosmolarity without nonketotic hyperglycemic-hyperosmolar coma (NKHHC): Secondary | ICD-10-CM | POA: Diagnosis not present

## 2020-03-25 NOTE — Progress Notes (Signed)
Parathyroid surgery planned this Friday.  Needs a note off for work and will discuss with Dr. Larey Dresser feels her anxiety is not at a good level for her to go back so soon after surgery. .  Has preop labs tomorrow--will see if they can just draw her A1C instead of a second stick here.  Rx written with ICD 10 code.  BP improved.  No change to meds for now.  Hope that as the hyperparathyroidism resolves, her bp will continue to improve as well.

## 2020-03-26 ENCOUNTER — Encounter (HOSPITAL_COMMUNITY): Payer: Self-pay

## 2020-03-26 ENCOUNTER — Encounter (HOSPITAL_COMMUNITY)
Admission: RE | Admit: 2020-03-26 | Discharge: 2020-03-26 | Disposition: A | Discharge status: Home / Self Care | Payer: BC Managed Care – PPO | Source: Ambulatory Visit | Attending: Surgery | Admitting: Surgery

## 2020-03-26 ENCOUNTER — Other Ambulatory Visit (HOSPITAL_COMMUNITY)
Admission: RE | Admit: 2020-03-26 | Discharge: 2020-03-26 | Disposition: A | Discharge status: Home / Self Care | Payer: BC Managed Care – PPO | Source: Ambulatory Visit | Attending: Surgery | Admitting: Surgery

## 2020-03-26 ENCOUNTER — Other Ambulatory Visit: Payer: Self-pay

## 2020-03-26 DIAGNOSIS — E119 Type 2 diabetes mellitus without complications: Secondary | ICD-10-CM | POA: Diagnosis not present

## 2020-03-26 DIAGNOSIS — Z7984 Long term (current) use of oral hypoglycemic drugs: Secondary | ICD-10-CM | POA: Diagnosis not present

## 2020-03-26 DIAGNOSIS — Z20822 Contact with and (suspected) exposure to covid-19: Secondary | ICD-10-CM | POA: Diagnosis not present

## 2020-03-26 DIAGNOSIS — Z886 Allergy status to analgesic agent status: Secondary | ICD-10-CM | POA: Diagnosis not present

## 2020-03-26 DIAGNOSIS — Z01812 Encounter for preprocedural laboratory examination: Secondary | ICD-10-CM | POA: Insufficient documentation

## 2020-03-26 DIAGNOSIS — Z79899 Other long term (current) drug therapy: Secondary | ICD-10-CM | POA: Diagnosis not present

## 2020-03-26 DIAGNOSIS — E210 Primary hyperparathyroidism: Secondary | ICD-10-CM | POA: Diagnosis present

## 2020-03-26 HISTORY — DX: Depression, unspecified: F32.A

## 2020-03-26 HISTORY — DX: Anxiety disorder, unspecified: F41.9

## 2020-03-26 HISTORY — DX: Anemia, unspecified: D64.9

## 2020-03-26 LAB — BASIC METABOLIC PANEL
Anion gap: 3 — ABNORMAL LOW (ref 5–15)
BUN: 13 mg/dL (ref 6–20)
CO2: 30 mmol/L (ref 22–32)
Calcium: 11.2 mg/dL — ABNORMAL HIGH (ref 8.9–10.3)
Chloride: 107 mmol/L (ref 98–111)
Creatinine, Ser: 0.73 mg/dL (ref 0.44–1.00)
GFR, Estimated: >60 mL/min (ref >60)
Glucose, Bld: 169 mg/dL — ABNORMAL HIGH (ref 70–99)
Potassium: 3.9 mmol/L (ref 3.5–5.1)
Sodium: 140 mmol/L (ref 135–145)

## 2020-03-26 LAB — SARS CORONAVIRUS 2 (TAT 6-24 HRS): SARS Coronavirus 2: NEGATIVE

## 2020-03-26 LAB — CBC
HCT: 44.1 % (ref 36.0–46.0)
Hemoglobin: 13.7 g/dL (ref 12.0–15.0)
MCH: 26.4 pg (ref 26.0–34.0)
MCHC: 31.1 g/dL (ref 30.0–36.0)
MCV: 85 fL (ref 80.0–100.0)
Platelets: 274 10*3/uL (ref 150–400)
RBC: 5.19 MIL/uL — ABNORMAL HIGH (ref 3.87–5.11)
RDW: 13.6 % (ref 11.5–15.5)
WBC: 6.4 10*3/uL (ref 4.0–10.5)
nRBC: 0 % (ref 0.0–0.2)

## 2020-03-26 LAB — HEMOGLOBIN A1C
Hgb A1c MFr Bld: 6.5 % — ABNORMAL HIGH (ref 4.8–5.6)
Mean Plasma Glucose: 139.85 mg/dL

## 2020-03-26 NOTE — Progress Notes (Addendum)
DUE TO COVID-19 ONLY ONE VISITOR IS ALLOWED TO COME WITH YOU AND STAY IN THE WAITING ROOM ONLY DURING PRE OP AND PROCEDURE DAY OF SURGERY. THE 1 VISITOR  MAY VISIT WITH YOU AFTER SURGERY IN YOUR PRIVATE ROOM DURING VISITING HOURS ONLY!  YOU NEED TO HAVE A COVID 19 TEST ON__3/01/2020 _____ @___1020_ , THIS TEST MUST BE DONE BEFORE SURGERY,  COVID TESTING SITE [address] [place] , IT IS ON THE RIGHT GOING OUT [address] APPROXIMATELY  2 MINUTES PAST [employer] ON THE RIGHT. ONCE YOUR COVID TEST IS COMPLETED,  PLEASE BEGIN THE QUARANTINE INSTRUCTIONS AS OUTLINED IN YOUR HANDOUT.                Rebecca Avila  03/26/2020   Your procedure is scheduled on: 03/29/2020    Report to [facility] Main  Entrance   Report to admitting at    1000 AM     Call this number if you have problems the morning of surgery [phone]    Remember: Do not eat food , candy gum or mints :After Midnight. You may have clear liquids from midnight until 0900am     CLEAR LIQUID DIET   Foods Allowed                                                                       Coffee and tea, regular and decaf                              Plain Jell-O any favor except red or purple                                            Fruit ices (not with fruit pulp)                                      Iced Popsicles                                     Carbonated beverages, regular and diet                                    Cranberry, grape and apple juices Sports drinks like Gatorade Lightly seasoned clear broth or consume(fat free) Sugar, honey syrup   _____________________________________________________________________    BRUSH YOUR TEETH MORNING OF SURGERY AND RINSE YOUR MOUTH OUT, NO CHEWING GUM CANDY OR MINTS.     Take these medicines the morning of surgery with A SIP OF WATER: toprol. Amlodipine, celexa  DO NOT TAKE ANY DIABETIC MEDICATIONS DAY OF YOUR SURGERY                                You may not have any metal on your  body including hair pins and              piercings  Do not wear jewelry, make-up, lotions, powders or perfumes, deodorant             Do not wear nail polish on your fingernails.  Do not shave  48 hours prior to surgery.              Men may shave face and neck.   Do not bring valuables to the hospital. [place].  Contacts, dentures or bridgework may not be worn into surgery.  Leave suitcase in the car. After surgery it may be brought to your room.     Patients discharged the day of surgery will not be allowed to drive home. IF YOU ARE HAVING SURGERY AND GOING HOME THE SAME DAY, YOU MUST HAVE AN ADULT TO DRIVE YOU HOME AND BE WITH YOU FOR 24 HOURS. YOU MAY GO HOME BY TAXI OR UBER OR ORTHERWISE, BUT AN ADULT MUST ACCOMPANY YOU HOME AND STAY WITH YOU FOR 24 HOURS.  Name and phone number of your driver:  Special Instructions: N/A              Please read over the following fact sheets you were given: _____________________________________________________________________  [facility] - Preparing for Surgery Before surgery, you can play an important role.  Because skin is not sterile, your skin needs to be as free of germs as possible.  You can reduce the number of germs on your skin by washing with CHG (chlorahexidine gluconate) soap before surgery.  CHG is an antiseptic cleaner which kills germs and bonds with the skin to continue killing germs even after washing. Please DO NOT use if you have an allergy to CHG or antibacterial soaps.  If your skin becomes reddened/irritated stop using the CHG and inform your nurse when you arrive at Short Stay. Do not shave (including legs and underarms) for at least 48 hours prior to the first CHG shower.  You may shave your face/neck. Please follow these instructions carefully:  1.  Shower with CHG Soap the night before surgery and the  morning of Surgery.  2.   If you choose to wash your hair, wash your hair first as usual with your  normal  shampoo.  3.  After you shampoo, rinse your hair and body thoroughly to remove the  shampoo.                           4.  Use CHG as you would any other liquid soap.  You can apply chg directly  to the skin and wash                       Gently with a scrungie or clean washcloth.  5.  Apply the CHG Soap to your body ONLY FROM THE NECK DOWN.   Do not use on face/ open                           Wound or open sores. Avoid contact with eyes, ears mouth and genitals (private parts).                       Wash face,  Development worker, international aid (private  parts) with your normal soap.             6.  Wash thoroughly, paying special attention to the area where your surgery  will be performed.  7.  Thoroughly rinse your body with warm water from the neck down.  8.  DO NOT shower/wash with your normal soap after using and rinsing off  the CHG Soap.                9.  Pat yourself dry with a clean towel.            10.  Wear clean pajamas.            11.  Place clean sheets on your bed the night of your first shower and do not  sleep with pets. Day of Surgery : Do not apply any lotions/deodorants the morning of surgery.  Please wear clean clothes to the hospital/surgery center.  FAILURE TO FOLLOW THESE INSTRUCTIONS MAY RESULT IN THE CANCELLATION OF YOUR SURGERY PATIENT SIGNATURE_________________________________  NURSE SIGNATURE__________________________________  ________________________________________________________________________

## 2020-03-26 NOTE — Progress Notes (Addendum)
Anesthesia Review:  PCP: DR Delrae Alfred LOV 03/25/20 Cardiologist : Chest x-ray : EKG :10/2019 Echo : Stress test: Cardiac Cath :  Activity level: can do a flight of stairs without difficulty  Sleep Study/ CPAP : no  Fasting Blood Sugar :      / Checks Blood Sugar -- times a day:   Blood Thinner/ Instructions /Last Dose: ASA / Instructions/ Last Dose :  Checks glucose 2 ties daily  Blood pressure at preop on 03/26/20 initial was 207-122. Left arm.  Recheck in right arm was 207/122.  Blood pressure in rihgt lower arm was 178/101.  Pt denies any chest pain, shortness of breath or headache.  PT states she took blood pressure meds this am at preop.  170/98 at appt with ER Mulberry on 02/07/20.  PT reports blood pressure as being 150/88 on 03/25/20 at MD office.  HGBA1c-6.5  See Dr Delrae Alfred ote of 03/25/2020.    BMP done 03/26/2020 routed to Dr Gerrit Friends.

## 2020-03-27 ENCOUNTER — Ambulatory Visit: Payer: Self-pay | Admitting: Surgery

## 2020-03-27 ENCOUNTER — Encounter (HOSPITAL_COMMUNITY): Payer: Self-pay | Admitting: Surgery

## 2020-03-27 NOTE — H&P (Signed)
General Surgery [facility]  Rebecca Avila DOB: [birth year] Single / Language: [place] / Race: Black or African American Female   History of Present Illness  The patient is a 55 year old female who presents with primary hyperparathyroidism.  CHIEF COMPLAINT: primary hyperparathyroidism  Patient is referred by Dr. Julieanne Manson for surgical evaluation and management of suspected primary hyperparathyroidism. Patient has had elevated serum calcium levels dating back to 2013. Recent laboratory studies show an elevated calcium level of 12.5 and an elevated intact PTH level of 150. Patient underwent imaging studies including an ultrasound and a nuclear medicine parathyroid scan, both of which localized a left inferior parathyroid adenoma. Patient was also noted to have thyroid nodules. She underwent fine-needle aspiration biopsy with benign cytopathology. Patient is now prepared to move forward with parathyroidectomy. Patient continues no chronic fatigue. She notes bone and joint pain, particularly in her knees.   Problem List/Past Medical  OSTEOPENIA (M85.80)  THYROID NODULE (E04.1)  PRIMARY HYPERPARATHYROIDISM (E21.0)   Past Surgical History Hysterectomy (not due to cancer) - Partial   Diagnostic Studies History  Colonoscopy  never Mammogram  never Pap Smear  1-5 years ago  Allergies  Ibuprofen *ANALGESICS - ANTI-INFLAMMATORY*  Anaphylaxis. Aspirin *ANALGESICS - NonNarcotic*  Anaphylaxis. Allergies Reconciled   Medication History Losartan Potassium (100MG  Tablet, Oral) Active. Citalopram Hydrobromide (10MG  Tablet, Oral) Active. amLODIPine Besylate (10MG  Tablet, Oral) Active. Simvastatin (10MG  Tablet, Oral) Active. glipiZIDE (5MG  Tablet, Oral) Active. metFORMIN HCl (500MG  Tablet, Oral) Active. Medications Reconciled  Social History  Alcohol use  Occasional alcohol use. Caffeine use  Tea. No drug use  Tobacco use  Current  some day smoker.  Family History  Alcohol Abuse  Father, Mother. Cancer  Brother, Father. Family history unknown  First Degree Relatives  Respiratory Condition  Mother.  Pregnancy / Birth History  Age at menarche  12 years. Age of menopause  [age] Contraceptive History  Oral contraceptives. Gravida  3 Irregular periods  Length (months) of breastfeeding  3-6 Maternal age  51-20 Para  1  Other Problems Anxiety Disorder  Back Pain  Diabetes Mellitus  High blood pressure   Vitals Weight: 331.8 lb Height: 67in Body Surface Area: 2.51 m Body Mass Index: 51.97 kg/m  Temp.: 97.87F (Infrared)  Pulse: 109 (Regular)   Physical Exam  The physical exam findings are as follows: Note: Limited examination  Anterior neck is symmetric. No palpable nodularity. No masses. No tenderness.    Assessment & Plan  PRIMARY HYPERPARATHYROIDISM (E21.0)  Patient returns to my practice today to discuss parathyroid surgery. She underwent evaluation this spring including ultrasound and nuclear medicine parathyroid scan. She was also noted to have thyroid nodules and underwent fine-needle aspiration biopsy with benign cytopathology. Patient is now prepared to proceed with parathyroid surgery.  Today we discussed the procedure minimally invasive outpatient parathyroid surgery. Her gland is localized to the left inferior position. There is a 95% chance that removal of the left inferior parathyroid gland will return her serum calcium level and PTH levels to normal. Patient would like to proceed with this in the near future.  Patient does have known thyroid nodules. We will continue to monitor those. She will need to have a follow-up ultrasound and TSH level followed by physical examination in the office sometime later in 2022.  The risks and benefits of the procedure have been discussed at length with the patient. The patient understands the proposed procedure,  potential alternative treatments, and the course of recovery to be  expected. All of the patient's questions have been answered at this time. The patient wishes to proceed with surgery.   Darnell Level, MD [facility] Office: [phone]

## 2020-03-27 NOTE — H&P (View-Only) (Signed)
General Surgery - [facility]  Rebecca Avila DOB: 02/11/1965 Single / Language: English / Race: Black or African American Female   History of Present Illness  The patient is a 55 year old female who presents with primary hyperparathyroidism.  CHIEF COMPLAINT: primary hyperparathyroidism  Patient is referred by Dr. Elizabeth Mulberry for surgical evaluation and management of suspected primary hyperparathyroidism. Patient has had elevated serum calcium levels dating back to 2013. Recent laboratory studies show an elevated calcium level of 12.5 and an elevated intact PTH level of 150. Patient underwent imaging studies including an ultrasound and a nuclear medicine parathyroid scan, both of which localized a left inferior parathyroid adenoma. Patient was also noted to have thyroid nodules. She underwent fine-needle aspiration biopsy with benign cytopathology. Patient is now prepared to move forward with parathyroidectomy. Patient continues no chronic fatigue. She notes bone and joint pain, particularly in her knees.   Problem List/Past Medical  OSTEOPENIA (M85.80)  THYROID NODULE (E04.1)  PRIMARY HYPERPARATHYROIDISM (E21.0)   Past Surgical History Hysterectomy (not due to cancer) - Partial   Diagnostic Studies History  Colonoscopy  never Mammogram  never Pap Smear  1-5 years ago  Allergies  Ibuprofen *ANALGESICS - ANTI-INFLAMMATORY*  Anaphylaxis. Aspirin *ANALGESICS - NonNarcotic*  Anaphylaxis. Allergies Reconciled   Medication History Losartan Potassium (100MG Tablet, Oral) Active. Citalopram Hydrobromide (10MG Tablet, Oral) Active. amLODIPine Besylate (10MG Tablet, Oral) Active. Simvastatin (10MG Tablet, Oral) Active. glipiZIDE (5MG Tablet, Oral) Active. metFORMIN HCl (500MG Tablet, Oral) Active. Medications Reconciled  Social History  Alcohol use  Occasional alcohol use. Caffeine use  Tea. No drug use  Tobacco use  Current  some day smoker.  Family History  Alcohol Abuse  Father, Mother. Cancer  Brother, Father. Family history unknown  First Degree Relatives  Respiratory Condition  Mother.  Pregnancy / Birth History  Age at menarche  12 years. Age of menopause  52-55 Contraceptive History  Oral contraceptives. Gravida  3 Irregular periods  Length (months) of breastfeeding  3-6 Maternal age  15-20 Para  1  Other Problems Anxiety Disorder  Back Pain  Diabetes Mellitus  High blood pressure   Vitals Weight: 331.8 lb Height: 67in Body Surface Area: 2.51 m Body Mass Index: 51.97 kg/m  Temp.: 97.3F (Infrared)  Pulse: 109 (Regular)   Physical Exam  The physical exam findings are as follows: Note: Limited examination  Anterior neck is symmetric. No palpable nodularity. No masses. No tenderness.    Assessment & Plan  PRIMARY HYPERPARATHYROIDISM (E21.0)  Patient returns to my practice today to discuss parathyroid surgery. She underwent evaluation this spring including ultrasound and nuclear medicine parathyroid scan. She was also noted to have thyroid nodules and underwent fine-needle aspiration biopsy with benign cytopathology. Patient is now prepared to proceed with parathyroid surgery.  Today we discussed the procedure minimally invasive outpatient parathyroid surgery. Her gland is localized to the left inferior position. There is a 95% chance that removal of the left inferior parathyroid gland will return her serum calcium level and PTH levels to normal. Patient would like to proceed with this in the near future.  Patient does have known thyroid nodules. We will continue to monitor those. She will need to have a follow-up ultrasound and TSH level followed by physical examination in the office sometime later in 2022.  The risks and benefits of the procedure have been discussed at length with the patient. The patient understands the proposed procedure,  potential alternative treatments, and the course of recovery to be   expected. All of the patient's questions have been answered at this time. The patient wishes to proceed with surgery.   Darnell Level, MD [facility] Office: [phone]

## 2020-03-28 MED ORDER — DEXTROSE 5 % IV SOLN
3.0000 g | INTRAVENOUS | Status: AC
  Administered 2020-03-29: 3 g via INTRAVENOUS
  Filled 2020-03-28: qty 3

## 2020-03-29 ENCOUNTER — Ambulatory Visit (HOSPITAL_COMMUNITY): Payer: BC Managed Care – PPO | Admitting: Certified Registered Nurse Anesthetist

## 2020-03-29 ENCOUNTER — Other Ambulatory Visit: Payer: Self-pay | Admitting: General Surgery

## 2020-03-29 ENCOUNTER — Other Ambulatory Visit: Payer: Self-pay

## 2020-03-29 ENCOUNTER — Encounter (HOSPITAL_COMMUNITY): Payer: Self-pay | Admitting: Surgery

## 2020-03-29 ENCOUNTER — Ambulatory Visit (HOSPITAL_COMMUNITY): Payer: BC Managed Care – PPO | Admitting: Physician Assistant

## 2020-03-29 ENCOUNTER — Ambulatory Visit (HOSPITAL_COMMUNITY)
Admission: RE | Admit: 2020-03-29 | Discharge: 2020-03-29 | Disposition: A | Discharge status: Home / Self Care | Payer: BC Managed Care – PPO | Source: Ambulatory Visit | Attending: Surgery | Admitting: Surgery

## 2020-03-29 ENCOUNTER — Encounter (HOSPITAL_COMMUNITY)
Admission: RE | Disposition: A | Discharge status: Home / Self Care | Payer: Self-pay | Source: Ambulatory Visit | Attending: Surgery

## 2020-03-29 DIAGNOSIS — E210 Primary hyperparathyroidism: Secondary | ICD-10-CM | POA: Insufficient documentation

## 2020-03-29 DIAGNOSIS — E119 Type 2 diabetes mellitus without complications: Secondary | ICD-10-CM | POA: Insufficient documentation

## 2020-03-29 DIAGNOSIS — Z79899 Other long term (current) drug therapy: Secondary | ICD-10-CM | POA: Insufficient documentation

## 2020-03-29 DIAGNOSIS — Z7984 Long term (current) use of oral hypoglycemic drugs: Secondary | ICD-10-CM | POA: Insufficient documentation

## 2020-03-29 DIAGNOSIS — Z20822 Contact with and (suspected) exposure to covid-19: Secondary | ICD-10-CM | POA: Insufficient documentation

## 2020-03-29 DIAGNOSIS — Z886 Allergy status to analgesic agent status: Secondary | ICD-10-CM | POA: Insufficient documentation

## 2020-03-29 HISTORY — PX: PARATHYROIDECTOMY: SHX19

## 2020-03-29 LAB — GLUCOSE, CAPILLARY
Glucose-Capillary: 87 mg/dL (ref 70–99)
Glucose-Capillary: 113 mg/dL — ABNORMAL HIGH (ref 70–99)

## 2020-03-29 SURGERY — PARATHYROIDECTOMY
Anesthesia: General | Site: Neck | Laterality: Left

## 2020-03-29 MED ORDER — MIDAZOLAM HCL 2 MG/2ML IJ SOLN
INTRAMUSCULAR | Status: AC
  Filled 2020-03-29: qty 2

## 2020-03-29 MED ORDER — CHLORHEXIDINE GLUCONATE CLOTH 2 % EX PADS
6.0000 | MEDICATED_PAD | Freq: Once | CUTANEOUS | Status: DC
  Administered 2020-03-29: 6 via TOPICAL

## 2020-03-29 MED ORDER — CHLORHEXIDINE GLUCONATE 0.12 % MT SOLN
15.0000 mL | Freq: Once | OROMUCOSAL | Status: AC
  Administered 2020-03-29: 15 mL via OROMUCOSAL

## 2020-03-29 MED ORDER — ESMOLOL HCL 100 MG/10ML IV SOLN
INTRAVENOUS | Status: AC
  Filled 2020-03-29: qty 10

## 2020-03-29 MED ORDER — CHLORHEXIDINE GLUCONATE CLOTH 2 % EX PADS
6.0000 | MEDICATED_PAD | Freq: Once | CUTANEOUS | Status: AC
  Administered 2020-03-29: 6 via TOPICAL

## 2020-03-29 MED ORDER — SUGAMMADEX SODIUM 200 MG/2ML IV SOLN
INTRAVENOUS | Status: DC | PRN
  Administered 2020-03-29: 300 mg via INTRAVENOUS

## 2020-03-29 MED ORDER — SUCCINYLCHOLINE CHLORIDE 200 MG/10ML IV SOSY
PREFILLED_SYRINGE | INTRAVENOUS | Status: DC | PRN
  Administered 2020-03-29: 120 mg via INTRAVENOUS

## 2020-03-29 MED ORDER — DEXAMETHASONE SODIUM PHOSPHATE 10 MG/ML IJ SOLN
INTRAMUSCULAR | Status: AC
  Filled 2020-03-29: qty 1

## 2020-03-29 MED ORDER — PROMETHAZINE HCL 25 MG/ML IJ SOLN
6.2500 mg | INTRAMUSCULAR | Status: DC | PRN
Start: 2020-03-29 — End: 2020-03-29
  Administered 2020-03-29: 6.25 mg via INTRAVENOUS

## 2020-03-29 MED ORDER — ONDANSETRON HCL 4 MG/2ML IJ SOLN
INTRAMUSCULAR | Status: DC | PRN
Start: 2020-03-29 — End: 2020-03-29
  Administered 2020-03-29: 4 mg via INTRAVENOUS

## 2020-03-29 MED ORDER — ROCURONIUM BROMIDE 10 MG/ML (PF) SYRINGE
PREFILLED_SYRINGE | INTRAVENOUS | Status: DC | PRN
  Administered 2020-03-29: 20 mg via INTRAVENOUS
  Administered 2020-03-29: 50 mg via INTRAVENOUS

## 2020-03-29 MED ORDER — LIDOCAINE 2% (20 MG/ML) 5 ML SYRINGE
INTRAMUSCULAR | Status: DC | PRN
  Administered 2020-03-29: 80 mg via INTRAVENOUS

## 2020-03-29 MED ORDER — 0.9 % SODIUM CHLORIDE (POUR BTL) OPTIME
TOPICAL | Status: DC | PRN
  Administered 2020-03-29: 1000 mL

## 2020-03-29 MED ORDER — HYDROCODONE-ACETAMINOPHEN 5-325 MG PO TABS: 1.0000 | ORAL_TABLET | Freq: Four times a day (QID) | ORAL | 0 refills | Status: DC | PRN

## 2020-03-29 MED ORDER — SUGAMMADEX SODIUM 500 MG/5ML IV SOLN
INTRAVENOUS | Status: AC
  Filled 2020-03-29: qty 5

## 2020-03-29 MED ORDER — FENTANYL CITRATE (PF) 100 MCG/2ML IJ SOLN
25.0000 ug | INTRAMUSCULAR | Status: DC | PRN
  Administered 2020-03-29: 25 ug via INTRAVENOUS
  Administered 2020-03-29 (×2): 50 ug via INTRAVENOUS
  Administered 2020-03-29: 25 ug via INTRAVENOUS

## 2020-03-29 MED ORDER — LACTATED RINGERS IV SOLN: INTRAVENOUS | Status: DC

## 2020-03-29 MED ORDER — BUPIVACAINE HCL 0.25 % IJ SOLN
INTRAMUSCULAR | Status: AC
  Filled 2020-03-29: qty 1

## 2020-03-29 MED ORDER — PROMETHAZINE HCL 25 MG/ML IJ SOLN
INTRAMUSCULAR | Status: AC
  Filled 2020-03-29: qty 1

## 2020-03-29 MED ORDER — OXYCODONE HCL 5 MG/5ML PO SOLN: 5.0000 mg | Freq: Once | ORAL | Status: AC | PRN

## 2020-03-29 MED ORDER — FENTANYL CITRATE (PF) 100 MCG/2ML IJ SOLN
INTRAMUSCULAR | Status: AC
  Filled 2020-03-29: qty 2

## 2020-03-29 MED ORDER — SUCCINYLCHOLINE CHLORIDE 200 MG/10ML IV SOSY
PREFILLED_SYRINGE | INTRAVENOUS | Status: AC
  Filled 2020-03-29: qty 10

## 2020-03-29 MED ORDER — FENTANYL CITRATE (PF) 250 MCG/5ML IJ SOLN
INTRAMUSCULAR | Status: AC
  Filled 2020-03-29: qty 5

## 2020-03-29 MED ORDER — ROCURONIUM BROMIDE 10 MG/ML (PF) SYRINGE
PREFILLED_SYRINGE | INTRAVENOUS | Status: AC
  Filled 2020-03-29: qty 10

## 2020-03-29 MED ORDER — PHENYLEPHRINE 40 MCG/ML (10ML) SYRINGE FOR IV PUSH (FOR BLOOD PRESSURE SUPPORT)
PREFILLED_SYRINGE | INTRAVENOUS | Status: AC
  Filled 2020-03-29: qty 10

## 2020-03-29 MED ORDER — BUPIVACAINE HCL 0.25 % IJ SOLN
INTRAMUSCULAR | Status: DC | PRN
  Administered 2020-03-29: 16 mL

## 2020-03-29 MED ORDER — PROPOFOL 10 MG/ML IV BOLUS
INTRAVENOUS | Status: AC
  Filled 2020-03-29: qty 20

## 2020-03-29 MED ORDER — LIDOCAINE 2% (20 MG/ML) 5 ML SYRINGE
INTRAMUSCULAR | Status: AC
  Filled 2020-03-29: qty 5

## 2020-03-29 MED ORDER — PHENYLEPHRINE 40 MCG/ML (10ML) SYRINGE FOR IV PUSH (FOR BLOOD PRESSURE SUPPORT)
PREFILLED_SYRINGE | INTRAVENOUS | Status: DC | PRN
  Administered 2020-03-29: 40 ug via INTRAVENOUS

## 2020-03-29 MED ORDER — ORAL CARE MOUTH RINSE: 15.0000 mL | Freq: Once | OROMUCOSAL | Status: AC

## 2020-03-29 MED ORDER — FENTANYL CITRATE (PF) 100 MCG/2ML IJ SOLN
INTRAMUSCULAR | Status: DC | PRN
  Administered 2020-03-29: 50 ug via INTRAVENOUS
  Administered 2020-03-29: 100 ug via INTRAVENOUS
  Administered 2020-03-29 (×2): 50 ug via INTRAVENOUS

## 2020-03-29 MED ORDER — DEXAMETHASONE SODIUM PHOSPHATE 10 MG/ML IJ SOLN
INTRAMUSCULAR | Status: DC | PRN
  Administered 2020-03-29: 10 mg via INTRAVENOUS

## 2020-03-29 MED ORDER — OXYCODONE HCL 5 MG PO TABS
5.0000 mg | ORAL_TABLET | Freq: Once | ORAL | Status: AC | PRN
  Administered 2020-03-29: 5 mg via ORAL

## 2020-03-29 MED ORDER — EPHEDRINE SULFATE-NACL 50-0.9 MG/10ML-% IV SOSY
PREFILLED_SYRINGE | INTRAVENOUS | Status: DC | PRN
  Administered 2020-03-29 (×2): 5 mg via INTRAVENOUS

## 2020-03-29 MED ORDER — PROPOFOL 10 MG/ML IV BOLUS
INTRAVENOUS | Status: DC | PRN
  Administered 2020-03-29: 200 mg via INTRAVENOUS

## 2020-03-29 MED ORDER — ESMOLOL HCL 100 MG/10ML IV SOLN
INTRAVENOUS | Status: DC | PRN
  Administered 2020-03-29: 20 mg via INTRAVENOUS

## 2020-03-29 MED ORDER — EPHEDRINE 5 MG/ML INJ
INTRAVENOUS | Status: AC
  Filled 2020-03-29: qty 10

## 2020-03-29 MED ORDER — ONDANSETRON HCL 4 MG/2ML IJ SOLN
INTRAMUSCULAR | Status: AC
  Filled 2020-03-29: qty 2

## 2020-03-29 MED ORDER — LABETALOL HCL 5 MG/ML IV SOLN
INTRAVENOUS | Status: AC
  Filled 2020-03-29: qty 4

## 2020-03-29 MED ORDER — OXYCODONE HCL 5 MG PO TABS
ORAL_TABLET | ORAL | Status: AC
  Filled 2020-03-29: qty 1

## 2020-03-29 MED ORDER — HYDRALAZINE HCL 20 MG/ML IJ SOLN: 10.0000 mg | INTRAMUSCULAR | Status: DC | PRN

## 2020-03-29 MED ORDER — MIDAZOLAM HCL 5 MG/5ML IJ SOLN
INTRAMUSCULAR | Status: DC | PRN
  Administered 2020-03-29: 2 mg via INTRAVENOUS

## 2020-03-29 SURGICAL SUPPLY — 33 items
ADH SKN CLS APL DERMABOND .7 (GAUZE/BANDAGES/DRESSINGS) ×1
APL PRP STRL LF DISP 70% ISPRP (MISCELLANEOUS) ×1
ATTRACTOMAT 16X20 MAGNETIC DRP (DRAPES) ×2 IMPLANT
BLADE SURG 15 STRL LF DISP TIS (BLADE) ×1 IMPLANT
BLADE SURG 15 STRL SS (BLADE) ×2
CHLORAPREP W/TINT 26 (MISCELLANEOUS) ×2 IMPLANT
CLIP VESOCCLUDE MED 6/CT (CLIP) ×4 IMPLANT
CLIP VESOCCLUDE SM WIDE 6/CT (CLIP) ×5 IMPLANT
COVER SURGICAL LIGHT HANDLE (MISCELLANEOUS) ×2 IMPLANT
COVER WAND RF STERILE (DRAPES) ×2 IMPLANT
DERMABOND ADVANCED (GAUZE/BANDAGES/DRESSINGS) ×1
DERMABOND ADVANCED .7 DNX12 (GAUZE/BANDAGES/DRESSINGS) ×1 IMPLANT
DRAPE LAPAROTOMY T 98X78 PEDS (DRAPES) ×2 IMPLANT
DRAPE UTILITY XL STRL (DRAPES) ×2 IMPLANT
ELECT REM PT RETURN 15FT ADLT (MISCELLANEOUS) ×2 IMPLANT
GAUZE 4X4 16PLY RFD (DISPOSABLE) ×2 IMPLANT
GLOVE SURG ORTHO LTX SZ8 (GLOVE) ×2 IMPLANT
GOWN STRL REUS W/TWL XL LVL3 (GOWN DISPOSABLE) ×6 IMPLANT
HEMOSTAT SURGICEL 2X4 FIBR (HEMOSTASIS) ×2 IMPLANT
ILLUMINATOR WAVEGUIDE N/F (MISCELLANEOUS) IMPLANT
KIT BASIN OR (CUSTOM PROCEDURE TRAY) ×2 IMPLANT
KIT TURNOVER KIT A (KITS) ×2 IMPLANT
NDL HYPO 25X1 1.5 SAFETY (NEEDLE) ×1 IMPLANT
NEEDLE HYPO 25X1 1.5 SAFETY (NEEDLE) ×2 IMPLANT
PACK BASIC VI WITH GOWN DISP (CUSTOM PROCEDURE TRAY) ×2 IMPLANT
PENCIL SMOKE EVACUATOR (MISCELLANEOUS) ×1 IMPLANT
SUT MNCRL AB 4-0 PS2 18 (SUTURE) ×2 IMPLANT
SUT VIC AB 3-0 SH 18 (SUTURE) ×2 IMPLANT
SYR BULB IRRIG 60ML STRL (SYRINGE) ×2 IMPLANT
SYR CONTROL 10ML LL (SYRINGE) ×2 IMPLANT
TOWEL OR 17X26 10 PK STRL BLUE (TOWEL DISPOSABLE) ×2 IMPLANT
TOWEL OR NON WOVEN STRL DISP B (DISPOSABLE) ×2 IMPLANT
TUBING CONNECTING 10 (TUBING) ×2 IMPLANT

## 2020-03-29 NOTE — Op Note (Signed)
OPERATIVE REPORT - PARATHYROIDECTOMY  Preoperative diagnosis: Primary hyperparathyroidism  Postop diagnosis: Same  Procedure: Left inferior minimally invasive parathyroidectomy (ectopic in left thyrothymic tract)  Surgeon:  Darnell Level, MD  Anesthesia: General endotracheal  Estimated blood loss: Minimal  Preparation: ChloraPrep  Indications: Patient is referred by Dr. Julieanne Manson for surgical evaluation and management of suspected primary hyperparathyroidism. Patient has had elevated serum calcium levels dating back to 2013. Recent laboratory studies show an elevated calcium level of 12.5 and an elevated intact PTH level of 150. Patient underwent imaging studies including an ultrasound and a nuclear medicine parathyroid scan, both of which localized a left inferior parathyroid adenoma. Patient was also noted to have thyroid nodules. She underwent fine-needle aspiration biopsy with benign cytopathology. Patient is now prepared to move forward with parathyroidectomy.  Procedure: The patient was prepared in the pre-operative holding area. The patient was brought to the operating room and placed in a supine position on the operating room table. Following administration of general anesthesia, the patient was positioned and then prepped and draped in the usual strict aseptic fashion. After ascertaining that an adequate level of anesthesia been achieved, a neck incision was made with a #15 blade. Dissection was carried through subcutaneous tissues and platysma. Hemostasis was obtained with the electrocautery and suture ligation of external jugular veins. Skin flaps were developed circumferentially and a Weitlander retractor was placed for exposure.  Strap muscles were incised in the midline. Strap muscles were reflected laterally exposing the thyroid lobe. With gentle blunt dissection the thyroid lobe was mobilized.  Dissection was carried through adipose tissue. Within the left thyrothymic  tract was an enlarged parathyroid gland measuring about 3 cm in greatest dimension. It was gently mobilized. Vascular structures were divided between small ligaclips. Care was taken to avoid the recurrent laryngeal nerve and the esophagus. The parathyroid gland was completely excised. It was submitted to pathology where frozen section confirmed parathyroid tissue consistent with adenoma.  Neck was irrigated with warm saline and good hemostasis was noted. Fibrillar was placed in the operative field. Strap muscles were approximated in the midline with interrupted 3-0 Vicryl sutures. Platysma was closed with interrupted 3-0 Vicryl sutures. Marcaine was infiltrated circumferentially. Skin was closed with a running 4-0 Monocryl subcuticular suture. Wound was washed and dried and Dermabond was applied. Patient was awakened from anesthesia and brought to the recovery room. The patient tolerated the procedure well.   Darnell Level, MD [facility] Office: [phone]

## 2020-03-29 NOTE — Anesthesia Postprocedure Evaluation (Signed)
Anesthesia Post Note  Patient: Rebecca Avila  Procedure(s) Performed: LEFT INFERIOR PARATHYROIDECTOMY (Left Neck)     Patient location during evaluation: PACU Anesthesia Type: General Level of consciousness: awake and alert Pain management: pain level controlled Vital Signs Assessment: post-procedure vital signs reviewed and stable Respiratory status: spontaneous breathing, nonlabored ventilation and respiratory function stable Cardiovascular status: blood pressure returned to baseline and stable Postop Assessment: no apparent nausea or vomiting Anesthetic complications: no   No complications documented.  Last Vitals:  Vitals:   03/29/20 1745 03/29/20 1800  BP: (!) 169/98 (!) 158/93  Pulse: 63 64  Resp: 13 16  Temp:  36.6 C  SpO2: 98% 92%    Last Pain:  Vitals:   03/29/20 1800  TempSrc:   PainSc: 5                  Beryle Lathe

## 2020-03-29 NOTE — Progress Notes (Addendum)
Patient had creamer with her coffee at 0900. Dr Mal Amabile made aware. Paging Dr Gerrit Friends to notify him that surgery will have to be delayed until 1500 today.   1125  Dr Gerrit Friends made aware.  1205  Surgery delayed until 1500. Patient made aware. She will notify her husband of delay in surgery.

## 2020-03-29 NOTE — Anesthesia Procedure Notes (Addendum)
Procedure Name: Intubation Date/Time: 03/29/2020 3:23 PM Performed by: Maxwell Caul, CRNA Pre-anesthesia Checklist: Patient identified, Emergency Drugs available, Suction available and Patient being monitored Patient Re-evaluated:Patient Re-evaluated prior to induction Oxygen Delivery Method: Circle system utilized Preoxygenation: Pre-oxygenation with 100% oxygen Induction Type: IV induction Laryngoscope Size: Mac and 4 Grade View: Grade I Tube type: Oral Tube size: 7.5 mm Number of attempts: 1 Airway Equipment and Method: Stylet Placement Confirmation: ETT inserted through vocal cords under direct vision,  positive ETCO2 and breath sounds checked- equal and bilateral Secured at: 21 cm Tube secured with: Tape Dental Injury: Teeth and Oropharynx as per pre-operative assessment

## 2020-03-29 NOTE — Progress Notes (Signed)
Updated Laurence Spates that his wife is going to surgery now.

## 2020-03-29 NOTE — Anesthesia Preprocedure Evaluation (Addendum)
Anesthesia Evaluation  Patient identified by MRN, date of birth, ID band Patient awake    Reviewed: Allergy & Precautions, NPO status , Patient's Chart, lab work & pertinent test results, reviewed documented beta blocker date and time   History of Anesthesia Complications Negative for: history of anesthetic complications  Airway Mallampati: III  TM Distance: >3 FB Neck ROM: Full    Dental  (+) Dental Advisory Given, Teeth Intact   Pulmonary former smoker,    Pulmonary exam normal        Cardiovascular hypertension, Pt. on medications and Pt. on home beta blockers Normal cardiovascular exam     Neuro/Psych PSYCHIATRIC DISORDERS Anxiety Depression negative neurological ROS     GI/Hepatic negative GI ROS, Neg liver ROS,   Endo/Other  diabetes, Type 2, Oral Hypoglycemic AgentsMorbid obesity Hyperparathyroidism Ca 11.2   Renal/GU negative Renal ROS     Musculoskeletal negative musculoskeletal ROS (+)   Abdominal   Peds  Hematology negative hematology ROS (+)   Anesthesia Other Findings Covid test negative   Reproductive/Obstetrics                            Anesthesia Physical Anesthesia Plan  ASA: III  Anesthesia Plan: General   Post-op Pain Management:    Induction: Intravenous  PONV Risk Score and Plan: 3 and Treatment may vary due to age or medical condition, Ondansetron, Dexamethasone and Midazolam  Airway Management Planned: Oral ETT  Additional Equipment: None  Intra-op Plan:   Post-operative Plan: Extubation in OR  Informed Consent: I have reviewed the patients History and Physical, chart, labs and discussed the procedure including the risks, benefits and alternatives for the proposed anesthesia with the patient or authorized representative who has indicated his/her understanding and acceptance.     Dental advisory given  Plan Discussed with: CRNA and  Anesthesiologist  Anesthesia Plan Comments:        Anesthesia Quick Evaluation

## 2020-03-29 NOTE — Transfer of Care (Signed)
Immediate Anesthesia Transfer of Care Note  Patient: Rebecca Avila  Procedure(s) Performed: LEFT INFERIOR PARATHYROIDECTOMY (Left Neck)  Patient Location: PACU  Anesthesia Type:General  Level of Consciousness: awake, alert  and oriented  Airway & Oxygen Therapy: Patient Spontanous Breathing and Patient connected to face mask oxygen  Post-op Assessment: Report given to RN and Post -op Vital signs reviewed and stable  Post vital signs: Reviewed and stable  Last Vitals:  Vitals Value Taken Time  BP 188/116 03/29/20 1638  Temp    Pulse 69 03/29/20 1643  Resp 24 03/29/20 1643  SpO2 100 % 03/29/20 1643  Vitals shown include unvalidated device data.  Last Pain:  Vitals:   03/29/20 1118  TempSrc:   PainSc: 0-No pain         Complications: No complications documented.

## 2020-03-29 NOTE — Interval H&P Note (Signed)
History and Physical Interval Note:  03/29/2020 11:30 AM  Rebecca Avila  has presented today for surgery, with the diagnosis of PRIMARY HYPERTHYROIDISM.  The various methods of treatment have been discussed with the patient and family. After consideration of risks, benefits and other options for treatment, the patient has consented to    Procedure(s) with comments: LEFT INFERIOR PARATHYROIDECTOMY (Left) - ROOM 1 STARTING AT 12:00PM FOR 90 MIN as a surgical intervention.    The patient's history has been reviewed, patient examined, no change in status, stable for surgery.  I have reviewed the patient's chart and labs.  Questions were answered to the patient's satisfaction.    Darnell Level, MD [facility] Office: [phone]   Darnell Level

## 2020-03-30 ENCOUNTER — Encounter (HOSPITAL_COMMUNITY): Payer: Self-pay | Admitting: Surgery

## 2020-03-31 DIAGNOSIS — R002 Palpitations: Secondary | ICD-10-CM | POA: Insufficient documentation

## 2020-03-31 DIAGNOSIS — F331 Major depressive disorder, recurrent, moderate: Secondary | ICD-10-CM | POA: Insufficient documentation

## 2020-04-01 LAB — SURGICAL PATHOLOGY

## 2020-04-16 ENCOUNTER — Encounter: Payer: BC Managed Care – PPO | Admitting: Gastroenterology

## 2020-04-16 ENCOUNTER — Telehealth: Payer: Self-pay | Admitting: Internal Medicine

## 2020-04-16 NOTE — Telephone Encounter (Signed)
Patient called requesting advice. Patient stated that she is having problems to urinate, she feels that she needs to urinate and goes to the bathroom and nothing comes out. After every try she has a burning sensation. Patient also stated that her blood sugar has been high for the past couple of days.

## 2020-04-18 ENCOUNTER — Other Ambulatory Visit (INDEPENDENT_AMBULATORY_CARE_PROVIDER_SITE_OTHER): Payer: BC Managed Care – PPO | Admitting: Internal Medicine

## 2020-04-18 ENCOUNTER — Other Ambulatory Visit: Payer: Self-pay

## 2020-04-18 DIAGNOSIS — E210 Primary hyperparathyroidism: Secondary | ICD-10-CM

## 2020-04-18 DIAGNOSIS — N3090 Cystitis, unspecified without hematuria: Secondary | ICD-10-CM | POA: Diagnosis not present

## 2020-04-18 DIAGNOSIS — F411 Generalized anxiety disorder: Secondary | ICD-10-CM | POA: Diagnosis not present

## 2020-04-18 DIAGNOSIS — I10 Essential (primary) hypertension: Secondary | ICD-10-CM | POA: Diagnosis not present

## 2020-04-18 DIAGNOSIS — F410 Panic disorder [episodic paroxysmal anxiety] without agoraphobia: Secondary | ICD-10-CM

## 2020-04-18 LAB — POCT URINALYSIS DIPSTICK
Bilirubin, UA: NEGATIVE
Glucose, UA: NEGATIVE
Ketones, UA: NEGATIVE
Nitrite, UA: NEGATIVE
Protein, UA: POSITIVE — AB
Spec Grav, UA: 1.01 (ref 1.010–1.025)
Urobilinogen, UA: 0.2 E.U./dL
pH, UA: 6 (ref 5.0–8.0)

## 2020-04-18 MED ORDER — LOSARTAN POTASSIUM 50 MG PO TABS
ORAL_TABLET | ORAL | 3 refills | Status: DC
Start: 2020-04-18 — End: 2020-07-03

## 2020-04-18 MED ORDER — GLIPIZIDE ER 10 MG PO TB24: 10.0000 mg | ORAL_TABLET | Freq: Every day | ORAL | 3 refills | Status: DC

## 2020-04-18 MED ORDER — ONETOUCH VERIO VI STRP: ORAL_STRIP | 3 refills | Status: AC

## 2020-04-18 MED ORDER — CIPROFLOXACIN HCL 500 MG PO TABS: ORAL_TABLET | ORAL | 0 refills | Status: DC

## 2020-04-18 MED ORDER — CITALOPRAM HYDROBROMIDE 10 MG PO TABS: 10.0000 mg | ORAL_TABLET | Freq: Every day | ORAL | 3 refills | Status: DC

## 2020-04-18 MED ORDER — ONETOUCH ULTRASOFT LANCETS MISC: 3 refills | Status: AC

## 2020-04-18 MED ORDER — AMLODIPINE BESYLATE 10 MG PO TABS: 10.0000 mg | ORAL_TABLET | Freq: Every day | ORAL | 3 refills | Status: DC

## 2020-04-18 MED ORDER — METOPROLOL SUCCINATE ER 50 MG PO TB24: 50.0000 mg | ORAL_TABLET | Freq: Every day | ORAL | 3 refills | Status: DC

## 2020-04-18 MED ORDER — SIMVASTATIN 40 MG PO TABS: 40.0000 mg | ORAL_TABLET | Freq: Every day | ORAL | 3 refills | Status: DC

## 2020-04-18 MED ORDER — METFORMIN HCL ER 500 MG PO TB24: ORAL_TABLET | ORAL | 3 refills | Status: DC

## 2020-04-18 NOTE — Progress Notes (Signed)
Patient walked in. 2 days ago, developed urinary frequency.  Some pressure in suprapubic area.  Yesterday, seemed better, so did not call back.   Symptoms returned today. No fever or flank pain.  Some tingling in hands since surgery for parathyroid adenoma excision. Following up with Dr. Gerrit Friends  Will need letter for work to stay at home.  Anxiety still high.  Not able to get Metoprolol from [facility] for 4 days--filled for 1 year, not clear why.  Would like meds switched to [facility]  NAD No flank tenderness on exam. UA with large blood and Leuks, though nitrite negative.  Urine very cloudy  1.  Cystitis:  Cipro 500 mg twice daily for 3 days.  Push fluids.  Urine culture  2.  Primary hyperparathyroidism:  Adenoma excision recently as per Dr. Gerrit Friends  3.  Anxiety:  Will write note to work from home for now.  4.  Hypertension:  Switch meds to [facility] not run out if has refills.

## 2020-04-21 LAB — URINE CULTURE

## 2020-04-30 ENCOUNTER — Ambulatory Visit: Payer: BC Managed Care – PPO | Admitting: Gastroenterology

## 2020-05-27 ENCOUNTER — Telehealth: Payer: Self-pay | Admitting: Clinical

## 2020-05-31 ENCOUNTER — Ambulatory Visit: Payer: BC Managed Care – PPO | Admitting: Gastroenterology

## 2020-05-31 NOTE — Telephone Encounter (Signed)
The note was from March She needs to come in and speak with me to get an idea of whether she can continue to stay home or not. Would also like a note from her supervisor as to what they want to know exactly

## 2020-06-03 ENCOUNTER — Other Ambulatory Visit: Payer: Self-pay | Admitting: Internal Medicine

## 2020-06-03 DIAGNOSIS — Z1231 Encounter for screening mammogram for malignant neoplasm of breast: Secondary | ICD-10-CM

## 2020-06-04 ENCOUNTER — Other Ambulatory Visit: Payer: Self-pay

## 2020-06-04 ENCOUNTER — Ambulatory Visit
Admission: RE | Admit: 2020-06-04 | Discharge: 2020-06-04 | Disposition: A | Discharge status: Home / Self Care | Payer: BC Managed Care – PPO | Source: Ambulatory Visit | Attending: Internal Medicine | Admitting: Internal Medicine

## 2020-06-04 DIAGNOSIS — Z1231 Encounter for screening mammogram for malignant neoplasm of breast: Secondary | ICD-10-CM

## 2020-06-05 ENCOUNTER — Encounter: Payer: Self-pay | Admitting: Internal Medicine

## 2020-06-05 ENCOUNTER — Ambulatory Visit (INDEPENDENT_AMBULATORY_CARE_PROVIDER_SITE_OTHER): Payer: BC Managed Care – PPO | Admitting: Internal Medicine

## 2020-06-05 VITALS — BP 150/98 | HR 76 | Resp 20 | Ht 67.5 in | Wt 341.8 lb

## 2020-06-05 DIAGNOSIS — I10 Essential (primary) hypertension: Secondary | ICD-10-CM | POA: Diagnosis not present

## 2020-06-05 DIAGNOSIS — F411 Generalized anxiety disorder: Secondary | ICD-10-CM

## 2020-06-05 DIAGNOSIS — Z6841 Body Mass Index (BMI) 40.0 and over, adult: Secondary | ICD-10-CM

## 2020-06-05 DIAGNOSIS — E210 Primary hyperparathyroidism: Secondary | ICD-10-CM

## 2020-06-05 DIAGNOSIS — F410 Panic disorder [episodic paroxysmal anxiety] without agoraphobia: Secondary | ICD-10-CM

## 2020-06-05 MED ORDER — METOPROLOL SUCCINATE ER 100 MG PO TB24: 100.0000 mg | ORAL_TABLET | Freq: Every day | ORAL | 3 refills | Status: DC

## 2020-06-05 MED ORDER — CITALOPRAM HYDROBROMIDE 20 MG PO TABS: 20.0000 mg | ORAL_TABLET | Freq: Every day | ORAL | 3 refills | Status: DC

## 2020-06-05 NOTE — Telephone Encounter (Signed)
Appointment for patient was schedule for 06/05/2020. Patient was aware and confirmed.

## 2020-06-05 NOTE — Progress Notes (Signed)
    Subjective:    Patient ID: Rebecca Avila, female   DOB: 1965-12-02, 55 y.o.   MRN: [id number]   HPI     Current Meds  Medication Sig  . acetaminophen (TYLENOL) 500 MG tablet 2 tabs by mouth twice daily (Patient taking differently: Take 1,000 mg by mouth every 6 (six) hours as needed for moderate pain.)  . amLODipine (NORVASC) 10 MG tablet Take 1 tablet (10 mg total) by mouth daily.  . Blood Glucose Monitoring Suppl (ONETOUCH VERIO) w/Device KIT Check blood sugars twice daily before meals  . citalopram (CELEXA) 10 MG tablet Take 1 tablet (10 mg total) by mouth daily.  Marland Kitchen glipiZIDE (GLIPIZIDE XL) 10 MG 24 hr tablet Take 1 tablet (10 mg total) by mouth daily with breakfast.  . glucose blood (ONETOUCH VERIO) test strip Check blood glucose twice daily before meals  . Lancets (ONETOUCH ULTRASOFT) lancets Check blood sugar twice daily before meals  . losartan (COZAAR) 50 MG tablet 2 tabs by mouth daily  . metFORMIN (GLUCOPHAGE-XR) 500 MG 24 hr tablet 2 tabs by mouth with breakfast daily  . metoprolol succinate (TOPROL-XL) 50 MG 24 hr tablet Take 1 tablet (50 mg total) by mouth daily. Take with or immediately following a meal.  . simvastatin (ZOCOR) 40 MG tablet Take 1 tablet (40 mg total) by mouth daily with breakfast.   Allergies  Allergen Reactions  . Aspirin Anaphylaxis  . Ibuprofen Anaphylaxis  . Ace Inhibitors Cough     Review of Systems    Objective:   BP (!) 150/98 (BP Location: Left Arm, Patient Position: Sitting, Cuff Size: Large)   Pulse 76   Resp 20   Ht 5' 7.5" (1.715 m)   Wt (!) 341 lb 12 oz (155 kg)   BMI 52.74 kg/m   Physical Exam  NAD Lungs:  CTA CV:  RRR without murmur or rub  Radial pulses normal and equal LE:   Obese, trace edema   Assessment & Plan  1.  Anxiety and panic disorder worsened with fear of death with COVID:  Increase Citalopram to 20 mg daily.   Recommending 90 days to work on this with increased medication and counseling.   Warm hand  off to [employer], LCSW  2.  Hypertension/obesity:  Discussed stationary bike or pool physical activity with bad right knee.  Start slow and add time weekly. Increase Toprol XL to 100 mg with bp check in 2 weeks.  3.  Hyperparathyroidism, S/P adenoma excision:  BMP today.

## 2020-06-06 LAB — BASIC METABOLIC PANEL
BUN/Creatinine Ratio: 12 (ref 9–23)
BUN: 11 mg/dL (ref 6–24)
CO2: 25 mmol/L (ref 20–29)
Calcium: 8.2 mg/dL — ABNORMAL LOW (ref 8.7–10.2)
Chloride: 99 mmol/L (ref 96–106)
Creatinine, Ser: 0.94 mg/dL (ref 0.57–1.00)
Glucose: 233 mg/dL — ABNORMAL HIGH (ref 65–99)
Potassium: 4.2 mmol/L (ref 3.5–5.2)
Sodium: 141 mmol/L (ref 134–144)
eGFR: 73 mL/min/{1.73_m2} (ref >59)

## 2020-06-13 ENCOUNTER — Telehealth (INDEPENDENT_AMBULATORY_CARE_PROVIDER_SITE_OTHER): Payer: BC Managed Care – PPO | Admitting: Clinical

## 2020-06-13 DIAGNOSIS — F410 Panic disorder [episodic paroxysmal anxiety] without agoraphobia: Secondary | ICD-10-CM

## 2020-06-13 DIAGNOSIS — F411 Generalized anxiety disorder: Secondary | ICD-10-CM

## 2020-06-17 NOTE — Progress Notes (Signed)
Integrated Behavioral Health Comprehensive Clinical Assessment [facility] updox MRN: [id number] Name: Rebecca Avila  Session Time: 12:00p-1:00pm Total time: 60  Type of Service: Integrated Behavioral Health-Individual Interpretor: No. Interpretor Name and Language: NA  Presenting problem:  Patient is a 55 African American/Black female who has presented for comprehensive clinical assessment to assess symptoms as a referral by PCP. Assessment was completed first mychart video unable to hear audio then transitioned to updox. Patient reports anxiety has been present, however for the last 6 months it has increased especially with the second wave of COVID case increase. "My state of min, it's high, thoughts get triggered by COVID then anxiety presents."  Patient reports her thoughts are related to the idea of going back to work, "I know I have to go back, but my anxiety increases get upset, start feeling it, and then can't control it. I don't know where the workers have been at." Patient reports she has her precautions and will go to the store and wear her mask but it is in and out compared to work she would like to sit there more than a short amount of time, so then she becomes fidgety, get anxious thoughts, can't sit still, and starts moving around. Patient reports if her anxiety gets out of control, she gets upset, sweaty, can't calm herself, feelings/thoughts like she can will have a heart attack knowing she has high risk conditions. Patient reports lately doesn't feel safe even with the precautions she starts obsessing with cleaning, and thinking of cleaning.   Patient reports when she is able to calm/cope, she will use techniques like stop, go to the safe place (her home). Patient reports when she is in stable mood, she can think, get along with other and be rational.   Social History:  Who lives in your current household? Patient lives alone How do describe your family relationships?  Good - Patient reports she has a daughter in [place] and her parents are deceased. Patient reports with her daughter "it's fantastic" What are your social supports? Art therapist, friends.  What are your hobbies? Cook, read, and watch movies Do you have any spiritual beliefs? Day time tv, and identify as [employer]   Education:  What is your highest level of education? some college, accounting.  Do you have history of developmental delays? No If currently in college or university, current major/program?  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 7years  Does your employer have any American Disabilities Act accommodations for you? Currently Dr. Amil Amen has written documentation seeking accommodation to work from home due to increase/ongoing anxiety/panic due to COVID-19, and risk of health conditions. Patient reports her mind starts racing, then get anxious, "get scared of catching COVID, I've had anxiety, I've been able to calm it but as I get older I get panicky." Patient reports she did go back in the environment of the work for about 6 months but with the waves of COVID she got scared she was going to get it and that is when her anxiety increased."  Current Military status (if applicable include dishonorable discharges and the reason): NA   Legal History Current/Past arrests, charges, incarcerations, etc: NA Current DSS/DHHS involvement, including foster care: NA Current DSS Case Worker name, phone number and email: NA Past DSS/DHHS involvement: NA   Medical History:   has a past medical history of Anemia, Anxiety, Depression, Diabetes mellitus without complication ([place]), Environmental allergies, Hypercalcemia (2014), Hypertension, and  Sciatica. Primary Care Physician: Mack Hook, MD Date of last physical exam: 02/07/20 Allergies:  Allergies  Allergen Reactions  . Aspirin Anaphylaxis  . Ibuprofen Anaphylaxis  . Ace Inhibitors Cough   Current medications:  Outpatient Encounter Medications as of 54/19/2022  Medication Sig  . acetaminophen (TYLENOL) 500 MG tablet 2 tabs by mouth twice daily (Patient taking differently: Take 1,000 mg by mouth every 6 (six) hours as needed for moderate pain.)  . amLODipine (NORVASC) 10 MG tablet Take 1 tablet (10 mg total) by mouth daily.  . Blood Glucose Monitoring Suppl (ONETOUCH VERIO) w/Device KIT Check blood sugars twice daily before meals  . citalopram (CELEXA) 20 MG tablet Take 1 tablet (20 mg total) by mouth daily.  Marland Kitchen EPINEPHrine (EPIPEN 2-PAK) 0.3 mg/0.3 mL IJ SOAJ injection Inject 0.3 mLs (0.3 mg total) into the muscle as needed for anaphylaxis. (Patient not taking: Reported on 06/05/2020)  . glipiZIDE (GLIPIZIDE XL) 10 MG 24 hr tablet Take 1 tablet (10 mg total) by mouth daily with breakfast.  . glucose blood (ONETOUCH VERIO) test strip Check blood glucose twice daily before meals  . Lancets (ONETOUCH ULTRASOFT) lancets Check blood sugar twice daily before meals  . losartan (COZAAR) 50 MG tablet 2 tabs by mouth daily  . metFORMIN (GLUCOPHAGE-XR) 500 MG 24 hr tablet 2 tabs by mouth with breakfast daily  . metoprolol succinate (TOPROL-XL) 100 MG 24 hr tablet Take 1 tablet (100 mg total) by mouth daily. Take with or immediately following a meal.  . simvastatin (ZOCOR) 40 MG tablet Take 1 tablet (40 mg total) by mouth daily with breakfast.   No facility-administered encounter medications on file as of 06/13/2020.   Patient reports it has been helpful, with increase of mg been able to think rational and be calmer.   Have you ever had any serious medication reactions? No  Psychiatric History (mental health or substance abuse)  Have you ever been treated for a mental  health/substance use problem? Yes If "Yes", when were you treated and whom did you see?   Dates from-Date to: 09/14-10/22/2020 Provider: [facility] Brandon Melnick, LCSWA Treatment Type: OPT Outcome/Follow Up: "very helpful, she was able to help me get back to work."    Family History: Is there any history of mental health problems or substance abuse in your family? Yes- Mother and grandmother Has anyone in your family been hospitalized for mental health treatment? Yes- mother   Mental Status:  General appearance/Behavior: Neat Eye contact: Good Motor behavior: Normal Speech: Normal Level of consciousness: Alert Mood: Euthymic Affect: Appropriate Thought process: Coherent Thought content: WNL Perception: Normal Judgment: Good Insight: Present Intellect:  WNL Memory: Within Normal limits Orientation: Full Orientated  Attention/Concentration Adequate  Comments:  Patient reports today she is in a good mood, "most of the time" Sleep Usual bedtime is "it has changed, go to bed earlier, speaking with the therapist help with this"  Sleeping arrangements: self Problems with snoring: Yes Obstructive sleep apnea is not a concern. Problems with nightmares: No Problems with sleepwalking: No  Trauma History: Have you ever experienced or been  exposed to any form of abuse?  Emotional? no Physical? no Sexual/assault? no Neglect? no Have you ever witnessed/been exposed to something traumatic? Yes- Back in [place]  Do you have any current symptoms? NA  Substance Abuse:  Do you use alcohol, nicotine or caffeine? NA Have you ever used illicit drugs or abused prescription medications? NA If yes? Substance Type-  Route- Age of first use?  Amount of Use? Frequency? Last use?  Do you have any problems with the following symptoms? NA Reason for use, any motivation to stop, what is stopping from use ?   Risk Assessment: Current danger to self Thoughts of suicide/death:  Self-harming  behaviors:    Suicide attempt:  Has plan:    Comments/clarify:  Client denied active SI     Past danger to self Thoughts of suicide/death:  Self-harming behaviors:    Suicide attempt:  Family history of suicide:    Comments/clarify: Client denied past SI     Current danger to others Thoughts to harm others:  Plans to harm others:    Threats to harm others:  Attempt to harm others:    Comments/clarify: Client denied active HI     Past danger to others Thoughts to harm others:  Plans to harm others:    Threats to harm others:  Attempt to harm others:    Comments/clarify: Client denied past HI    RISK TO SELF Low to no risk: x Moderate risk:  Severe risk:   RISK TO OTHERS Low to no risk: x Moderate risk:  Severe risk:     Do you have any protective factors that keep you from attempting?   Psychosocial strengths and stressors: Relationship support/concerns/needs: NA Financial concerns/needs: NA Financial resources (other sources of income, not from job): NA Housing concerns/needs: NA  Diagnosis   ICD-10-CM   1. Generalized anxiety disorder with panic attacks  F41.1 AEB A. excessive worry most days lasting longer than 6 months time B. difficulty controlling worry C. feeling on edge, difficulty with concentration, irritability, sleep disturbance D. clinical distress in social situations E. no substance use currently F. not better explained by another MH diagnosis at this time   F41.0     Patient outcome?  What do you want out of treatment? "manage when the anxiety attacks come, and challenging those thoughts." Patient reports she realizes she may have to switch jobs and "take care of myself."   GOALS ADDRESSED: Patient will reduce symptoms of: anxiety and increase knowledge and/or ability of: coping skills, self-management skills and stress reduction. INTERVENTIONS: Interventions utilized: Supportive Counseling Standardized Assessments completed: GAD7(7)  [id number])  PLAN: Patient is a 55 year old Black/AA female who has presented for a clinical assessment as a referral from PCP due to ongoing anxiety. Patient reports ongoing anxiety is becoming a barrier to return to her work. Patient reports previous treatment was helpful and that is the reason she has decided to try again.  Based on screeners, presenting symptoms and diagnosis social worker is recommending therapy and keeping previous diagnosis of  F41.1 Generalized anxiety disorder with panic attacks. Patient denied active SI/HI.   Scheduled next visit: 06/01 12pm virtual   [employer]

## 2020-06-19 ENCOUNTER — Ambulatory Visit (INDEPENDENT_AMBULATORY_CARE_PROVIDER_SITE_OTHER): Payer: BC Managed Care – PPO | Admitting: Internal Medicine

## 2020-06-19 ENCOUNTER — Other Ambulatory Visit: Payer: Self-pay

## 2020-06-19 VITALS — BP 140/90 | HR 72

## 2020-06-19 DIAGNOSIS — I10 Essential (primary) hypertension: Secondary | ICD-10-CM

## 2020-06-19 NOTE — Progress Notes (Signed)
Patient here for blood pressure check. Patient is taking amlodipine 10MG  tablet daily and succinate 100MG  224 hr tablet one daily.

## 2020-06-26 ENCOUNTER — Telehealth: Payer: BC Managed Care – PPO | Admitting: Clinical

## 2020-06-26 DIAGNOSIS — F410 Panic disorder [episodic paroxysmal anxiety] without agoraphobia: Secondary | ICD-10-CM

## 2020-06-26 NOTE — Progress Notes (Signed)
LCSW began session at 12:10pm sent linked however webpage and Epic froze and unable to dial to patient to inform of delay. At the time of 12:20pm LCSW attempted to call patient left voicemail with apology of the delay and see if she wanted to reschedule. Patient contacted office to return call and informed understanding and accepted new appointment time 06/02 12pm

## 2020-06-27 ENCOUNTER — Telehealth (INDEPENDENT_AMBULATORY_CARE_PROVIDER_SITE_OTHER): Payer: BC Managed Care – PPO | Admitting: Clinical

## 2020-06-27 DIAGNOSIS — F411 Generalized anxiety disorder: Secondary | ICD-10-CM

## 2020-06-27 DIAGNOSIS — F410 Panic disorder [episodic paroxysmal anxiety] without agoraphobia: Secondary | ICD-10-CM | POA: Diagnosis not present

## 2020-07-01 NOTE — Progress Notes (Signed)
   THERAPY PROGRESS NOTE Via mychart video Session Time: 12-1p Participation Level: Active Behavioral Response: CasualAlertEuthymic Type of Therapy: Individual Therapy Treatment Goals addressed: Identify, challenge, and replace biased, fearful self-talk with positive, realistic, and empowering self-talk.   Purpose: LCSW met with client for routine individual therapy to work towards treatment goals: Identify, challenge, and replace biased, fearful self-talk with positive, realistic, and empowering self-talk  Intervention: LCSW met with patient for routine individual therapy session to begin to work towards treatment goals. LCSW provided patient opportunity to check to assess for any significant events and how she is doing today. LCSW utilized intervention of CBT by introducing this therapy intervention and the CBT triangle. LCSW utilized thought of "I am sick" to practice in session the triangle with patient to implement thought challenge also another example was provided to patient. LCSW provided psychoeducation of skills of 3C's (Catch, Check, and Change) and how to use thought record table to begin to write down thoughts and eventually be to able to identify thought pattern. Patient identified learning STOP technique, LCSW added more steps to it of how it can also be used for thought stopping and coping with the anxiety.  LCSW assessed for SI/HI/command psychosis.  Effectiveness: Patient is alert x4 affect. Patient reports today she is doing good. Patient reports she was off today. Patient reported in session she is reflecting her current employment may not be the best fit for mental health. Patient reports she is working towards thinking what could be realistic. Patient reports in session she is open to all approaches as her previous therapist was very helpful in learning coping skills to manager her anxiety. Patient shared more of the history of her ongoing anxiety here in session. Using the  example of "I am sick" patient was able to easily identify another perspective and help LCSW challenge this thought. Patient also was able to hear a real life example of LCSW recent situation to listen how thought record could be used as another way to challenge it.  Patient expressed understanding of the metaphor of STOP sign and using it from a perspective to ground when anxiety is present. LCSW emailed patient thought record table and STOP skill to reference.   Intervention was effective as patient was able to reflect and process in session. Progress towards goal is Ongoing. Patient denied active suicidal/homicidal/active psychosis.  Plan Patient offered next appointment for: 06/16 12p  Diagnosis: Generalized Anxiety Disorder w/panic    Lujean Rave, LCSW 07/01/2020

## 2020-07-03 ENCOUNTER — Ambulatory Visit (INDEPENDENT_AMBULATORY_CARE_PROVIDER_SITE_OTHER): Payer: BC Managed Care – PPO | Admitting: Internal Medicine

## 2020-07-03 ENCOUNTER — Encounter: Payer: Self-pay | Admitting: Internal Medicine

## 2020-07-03 ENCOUNTER — Other Ambulatory Visit: Payer: Self-pay

## 2020-07-03 VITALS — BP 140/90 | HR 72 | Resp 20 | Ht 67.75 in | Wt 336.0 lb

## 2020-07-03 DIAGNOSIS — E1100 Type 2 diabetes mellitus with hyperosmolarity without nonketotic hyperglycemic-hyperosmolar coma (NKHHC): Secondary | ICD-10-CM | POA: Diagnosis not present

## 2020-07-03 DIAGNOSIS — I10 Essential (primary) hypertension: Secondary | ICD-10-CM

## 2020-07-03 DIAGNOSIS — Z0000 Encounter for general adult medical examination without abnormal findings: Secondary | ICD-10-CM | POA: Diagnosis not present

## 2020-07-03 DIAGNOSIS — E782 Mixed hyperlipidemia: Secondary | ICD-10-CM

## 2020-07-03 DIAGNOSIS — E210 Primary hyperparathyroidism: Secondary | ICD-10-CM

## 2020-07-03 MED ORDER — LOSARTAN POTASSIUM-HCTZ 100-25 MG PO TABS: 1.0000 | ORAL_TABLET | Freq: Every day | ORAL | 3 refills | Status: DC

## 2020-07-03 MED ORDER — CALCIUM CARB-CHOLECALCIFEROL 500-400 MG-UNIT PO TABS: ORAL_TABLET | ORAL | Status: DC

## 2020-07-03 NOTE — Progress Notes (Signed)
Subjective:    Patient ID: Rebecca Avila, female   DOB: 1965-02-14, 55 y.o.   MRN: [id number]   HPI   CPE without pap  1.  Pap:  Last pap before hysterectomy or just after.  Always normal.    2.  Mammogram:  Last 06/04/20 and normal.  No family history of breast cancer.   3.  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  She is not a dairy person.  Is doing some sort of pedal thing like bike pedals.  Uses all day when sitting.  Does walk about 1 hour twice weekly.    4.  Guaiac Cards:  Never.  5.  Colonoscopy:  Never.  She has not been able to get in for her screening colonoscopy.  Apparently, will be called in 6 weeks to schedule.  No family history of colon cancer.   6.  Immunizations:  Has had [place] booster recently Immunization History  Administered Date(s) Administered   PFIZER Comirnaty(Gray Top)Covid-19 Tri-Sucrose Vaccine 06/05/2020   PFIZER(Purple Top)SARS-COV-2 Vaccination 09/25/2019, 10/24/2019   Pneumococcal Polysaccharide-23 11/06/2019   Td 11/06/2019      7.  Glucose/Cholesterol:  A1C at goal in March at 6.5%.  Sugars have been high recently in 200s.  Cholesterol at goal in November.   Lipid Panel     Component Value Date/Time   CHOL 127 12/08/2019 1125   TRIG 113 12/08/2019 1125   HDL 41 12/08/2019 1125   LDLCALC 65 12/08/2019 1125   LABVLDL 21 12/08/2019 1125     Current Meds  Medication Sig   acetaminophen (TYLENOL) 500 MG tablet 2 tabs by mouth twice daily (Patient taking differently: Take 1,000 mg by mouth every 6 (six) hours as needed for moderate pain.)   amLODipine (NORVASC) 10 MG tablet Take 1 tablet (10 mg total) by mouth daily.   Blood Glucose Monitoring Suppl (ONETOUCH VERIO) w/Device KIT Check blood sugars twice daily before meals   citalopram (CELEXA) 20 MG tablet Take 1 tablet (20 mg total) by mouth daily.   glipiZIDE (GLIPIZIDE XL) 10 MG 24 hr tablet Take 1 tablet (10 mg  total) by mouth daily with breakfast.   glucose blood (ONETOUCH VERIO) test strip Check blood glucose twice daily before meals   Lancets (ONETOUCH ULTRASOFT) lancets Check blood sugar twice daily before meals   losartan (COZAAR) 50 MG tablet 2 tabs by mouth daily   metFORMIN (GLUCOPHAGE-XR) 500 MG 24 hr tablet 2 tabs by mouth with breakfast daily   metoprolol succinate (TOPROL-XL) 100 MG 24 hr tablet Take 1 tablet (100 mg total) by mouth daily. Take with or immediately following a meal.   simvastatin (ZOCOR) 40 MG tablet Take 1 tablet (40 mg total) by mouth daily with breakfast.   Allergies  Allergen Reactions   Aspirin Anaphylaxis   Ibuprofen Anaphylaxis   Ace Inhibitors Cough   Past Medical History:  Diagnosis Date   Anemia    Anxiety    Depression    Diabetes mellitus without complication ([place])    type 2    Environmental allergies    Hypercalcemia 2014   2020--significant increase in calcium/ionized calcium with elevated PTH--hyperparathyroidism   Hypertension    Sciatica    Past Surgical History:  Procedure Laterality Date   ABDOMINAL HYSTERECTOMY  2007   fibroids--benign etiology   PARATHYROIDECTOMY Left 03/29/2020   Procedure: LEFT INFERIOR PARATHYROIDECTOMY;  Surgeon: Armandina Gemma, MD;  Location: [facility];  Service: General;  Laterality: Left;  ROOM 1 STARTING AT 12:00PM FOR 66 MIN   PARTIAL HYSTERECTOMY     Family History  Problem Relation Age of Onset   High blood pressure Father    High blood pressure Paternal Grandmother    High blood pressure Paternal Aunt    Lung cancer Mother    Cancer Brother        congenital tumor   Diabetes Neg Hx    Thyroid disease Neg Hx    Social History   Socioeconomic History   Marital status: Single    Spouse name: Not on file   Number of children: Not on file   Years of education: Not on file   Highest education level: Not on file  Occupational History   Not on file  Tobacco Use   Smoking status: Former    Packs/day: 0.00     Pack years: 0.00    Types: Cigarettes    Start date: 01/27/1992    Quit date: 03/29/2020    Years since quitting: 0.3   Smokeless tobacco: Never   Tobacco comments:    once monthly  Vaping Use   Vaping Use: Never used  Substance and Sexual Activity   Alcohol use: Yes    Comment: occ   Drug use: Never   Sexual activity: Yes    Birth control/protection: Surgical  Other Topics Concern   Not on file  Social History Narrative   Lives with fiance.  No home health services.  No assist devices.  Currently unemployed.     Social Determinants of Health   Financial Resource Strain: Not on file  Food Insecurity: Not on file  Transportation Needs: Not on file  Physical Activity: Not on file  Stress: Not on file  Social Connections: Not on file  Intimate Partner Violence: Not on file      Review of Systems  Eyes: Positive for visual disturbance (poor vision.  Has not made and eye exam appt).  Respiratory: Negative for shortness of breath.   Cardiovascular: Positive for chest pain (Mainly on right.  Not with exertion.  ) and leg swelling (Left ankle and foot swelling by end of day.). Negative for palpitations.  Gastrointestinal: Positive for diarrhea (sometimes.). Negative for abdominal pain and blood in stool (No melena).  Neurological: Positive for numbness (Tingling in toes, especially on left--more so at end of day when swollen.). Negative for weakness.      Objective:   BP 140/90 (BP Location: Right Arm, Patient Position: Sitting, Cuff Size: Large)   Pulse 72   Resp 20   Ht 5' 7.75" (1.721 m)   Wt (!) 336 lb (152.4 kg)   BMI 51.47 kg/m   Physical Exam Constitutional:      Appearance: She is obese.  HENT:     Head: Normocephalic and atraumatic.     Right Ear: Tympanic membrane, ear canal and external ear normal.     Left Ear: Tympanic membrane, ear canal and external ear normal.     Nose: Nose normal.     Mouth/Throat:     Mouth: Mucous membranes are moist.      Pharynx: Oropharynx is clear.  Eyes:     Extraocular Movements: Extraocular movements intact.     Conjunctiva/sclera: Conjunctivae normal.     Pupils: Pupils are equal, round, and reactive to light.     Comments: Discs sharp bilaterally.  Neck:     Thyroid: No thyroid mass or thyromegaly.  Cardiovascular:  Rate and Rhythm: Normal rate and regular rhythm.     Heart sounds: S1 normal and S2 normal. No murmur heard.   No friction rub. No S3 or S4 sounds.     Comments: No carotid bruits.  Carotid, radial, femoral, DP and PT pulses normal and equal.  Pulmonary:     Effort: Pulmonary effort is normal.     Breath sounds: Normal breath sounds.  Chest:  Breasts:    Right: No mass, nipple discharge, skin change, tenderness, axillary adenopathy or supraclavicular adenopathy.     Left: No mass, nipple discharge, skin change, tenderness, axillary adenopathy or supraclavicular adenopathy.  Abdominal:     General: Bowel sounds are normal.     Palpations: Abdomen is soft. There is no hepatomegaly, splenomegaly or mass.     Tenderness: There is no abdominal tenderness.  Genitourinary:    Comments: Normal external female genitalia.  No discharge.  No uterine area or adnexal mass or tenderness. Musculoskeletal:        General: Normal range of motion.     Cervical back: Normal range of motion and neck supple.     Right lower leg: 1+ Pitting Edema present.     Left lower leg: 2+ Pitting Edema present.  Feet:     Comments: Diabetic foot exam was performed with the following findings:   No deformities, ulcerations, or other skin breakdown Normal sensation of 10g monofilament Intact posterior tibialis and dorsalis pedis pulses Pitting edema of both feet L>>R    Lymphadenopathy:     Head:     Right side of head: No submental or submandibular adenopathy.     Left side of head: No submental or submandibular adenopathy.     Cervical: No cervical adenopathy.     Upper Body:     Right upper body:  No supraclavicular or axillary adenopathy.     Left upper body: No supraclavicular or axillary adenopathy.     Lower Body: No right inguinal adenopathy. No left inguinal adenopathy.  Skin:    General: Skin is warm.     Capillary Refill: Capillary refill takes less than 2 seconds.  Neurological:     Mental Status: She is alert.     Cranial Nerves: Cranial nerves are intact.     Sensory: Sensation is intact.     Motor: Motor function is intact.     Coordination: Coordination is intact.     Gait: Gait is intact.     Deep Tendon Reflexes: Reflexes are normal and symmetric.  Psychiatric:        Attention and Perception: Attention normal.        Mood and Affect: Mood normal.        Speech: Speech normal.        Behavior: Behavior normal. Behavior is cooperative.        Thought Content: Thought content normal.     Assessment & Plan   1.  CPE without pap Mammogram recently normal. Should be called mid July for screening colonoscopy. Encouraged influenza vaccination in fall and to look for new version of COVID vaccination then as well. Recommend Calcium and Vitamin D supplementation twice daily now she is S/P parathyroid adenoma excision.  2.  S/P parathyroid excision:  check CMP in 4 weeks.  3.  DM:  A1C in 4 weeks.  Diabetic eye exam referral.  4.  Hypertension with edema:  not adequately controlled.  Add HCTZ to Losartan.  CMP in 1 month  5.  Hyperlipidemia:  at goal in past 2 years.

## 2020-07-11 ENCOUNTER — Telehealth: Payer: BC Managed Care – PPO | Admitting: Clinical

## 2020-07-11 DIAGNOSIS — F411 Generalized anxiety disorder: Secondary | ICD-10-CM

## 2020-07-11 DIAGNOSIS — F410 Panic disorder [episodic paroxysmal anxiety] without agoraphobia: Secondary | ICD-10-CM

## 2020-07-18 NOTE — Progress Notes (Signed)
Due to technological difficulties to log in to mychart video unable to complete whole session. LCSW transitioned session to phone call to provide a brief check in to assess if anything current to be addressed for session. Patient reports she was doing good and nothing at this time. Due to trouble hearing mutually decided to reschedule session for the next two weeks June 30th 12pm via virtual.

## 2020-07-25 ENCOUNTER — Telehealth: Payer: BC Managed Care – PPO | Admitting: Clinical

## 2020-07-31 ENCOUNTER — Telehealth: Payer: BC Managed Care – PPO | Admitting: Clinical

## 2020-08-05 ENCOUNTER — Other Ambulatory Visit (INDEPENDENT_AMBULATORY_CARE_PROVIDER_SITE_OTHER): Payer: BC Managed Care – PPO

## 2020-08-05 ENCOUNTER — Other Ambulatory Visit: Payer: Self-pay

## 2020-08-05 ENCOUNTER — Ambulatory Visit (INDEPENDENT_AMBULATORY_CARE_PROVIDER_SITE_OTHER): Payer: BC Managed Care – PPO | Admitting: Clinical

## 2020-08-05 VITALS — BP 136/88

## 2020-08-05 DIAGNOSIS — Z79899 Other long term (current) drug therapy: Secondary | ICD-10-CM

## 2020-08-05 DIAGNOSIS — F410 Panic disorder [episodic paroxysmal anxiety] without agoraphobia: Secondary | ICD-10-CM | POA: Diagnosis not present

## 2020-08-05 DIAGNOSIS — F411 Generalized anxiety disorder: Secondary | ICD-10-CM

## 2020-08-05 DIAGNOSIS — I10 Essential (primary) hypertension: Secondary | ICD-10-CM

## 2020-08-05 DIAGNOSIS — E1100 Type 2 diabetes mellitus with hyperosmolarity without nonketotic hyperglycemic-hyperosmolar coma (NKHHC): Secondary | ICD-10-CM | POA: Diagnosis not present

## 2020-08-05 NOTE — Progress Notes (Signed)
   THERAPY PROGRESS NOTE  Session Time: 12-1pm Participation Level: Active Behavioral Response: CasualAlertEuthymic Type of Therapy: Individual Therapy Treatment Goals addressed: Identify, challenge, and replace biased, fearful self-talk with positive, realistic, and empowering self-talk  Purpose: LCSW met with client for routine individual therapy to work towards treatment goals: Identify, challenge, and replace biased, fearful self-talk with positive, realistic, and empowering self-talk  Intervention: LCSW met with patient for last routine session due to LCSW leaving clinic. Patient requested to be seen due to recent panic attacks and wanted to process them. LCSW utilized intervention of CBT to process and identify skills to decrease her anxiety symptoms. LCSW provided reflective listening as patient processed recent anxiety. Patient processed her coping skills she has used to cope with them. For this session LCSW provided patient skills of grounding techniques (physical, mental and soothing) and use of tapping (the butterfly hug). LCSW and patient practiced calm breathing exercise while in session. Patient and LCSW discussed plan to continue treatment. LCSW assessed for SI/HI/command psychosis.   Effectiveness: Patient is alert x4 affect. Patient reports wanting to have this session due to observing an increase of anxiety attacks. Patient recognized it got triggered with thinking her loved ones have passed away and not having a Chief Financial Officer" aside of her therapist. Patient reports her partner is a good listener and supportive and she is grateful for him but she recalls her loved ones grandmother, mother are no longer here. Patient reports feeling jittery, and chest pain. Patient reports she has been able to cope with it by letting it feel and calming herself. Patient was receptive to understanding although they are no longer physically here, their wisdom, moments shared with her are with her and she  can look to them for guidance. Patient reported also using her spiritual belief with this.   Patient reports she did practice 3 C's coping skill and felt it was effective. Patient was receptive to trying out calm breathing, practiced it a couple time and shared how she can see this being helpful. Patient practiced along examples of grounding techniques. Patient reports she will incorporate her spiritual faith into her grounding techniques.  Patient provided update her awareness going forward in regards to her job, reports communication is not being received and she has begun to look for alternatives for incomes such as applying for assistance. Patient reports job has not been receptive to her mental health and recognizes her mental health and well being is priority. LCSW praised patient on her attitude to manage her symptoms and reframing her thoughts. LCSW discussed continued treatment plan as patient would like to continue to receive therapy upon next social worker to come in. Patient reports she feels at the good spot right now but would like to continue to learn the skills to manage her panic attacks and reframing thoughts.   Intervention was effective as patient was able to reflect, engaged in the session and able to process her feelings. Progress towards goal is Ongoing as she would like to continue to receive care and Achieved with this clinician.  Patient denied active suicidal/homicidal/active psychosis.  Plan Patient offered next appointment for: next social worker to contact to begin care.   Diagnosis: GAD w/panic    Lujean Rave, LCSW 08/05/2020

## 2020-08-06 LAB — COMPREHENSIVE METABOLIC PANEL
ALT: 6 IU/L (ref 0–32)
AST: 7 IU/L (ref 0–40)
Albumin/Globulin Ratio: 1.6 (ref 1.2–2.2)
Albumin: 4.3 g/dL (ref 3.8–4.9)
Alkaline Phosphatase: 117 IU/L (ref 44–121)
BUN/Creatinine Ratio: 20 (ref 9–23)
BUN: 17 mg/dL (ref 6–24)
Bilirubin Total: 0.2 mg/dL (ref 0.0–1.2)
CO2: 26 mmol/L (ref 20–29)
Calcium: 8.3 mg/dL — ABNORMAL LOW (ref 8.7–10.2)
Chloride: 100 mmol/L (ref 96–106)
Creatinine, Ser: 0.85 mg/dL (ref 0.57–1.00)
Globulin, Total: 2.7 g/dL (ref 1.5–4.5)
Glucose: 215 mg/dL — ABNORMAL HIGH (ref 65–99)
Potassium: 3.7 mmol/L (ref 3.5–5.2)
Sodium: 146 mmol/L — ABNORMAL HIGH (ref 134–144)
Total Protein: 7 g/dL (ref 6.0–8.5)
eGFR: 82 mL/min/{1.73_m2} (ref >59)

## 2020-08-06 LAB — HEMOGLOBIN A1C
Est. average glucose Bld gHb Est-mCnc: 214 mg/dL
Hgb A1c MFr Bld: 9.1 % — ABNORMAL HIGH (ref 4.8–5.6)

## 2020-09-26 ENCOUNTER — Ambulatory Visit: Payer: Self-pay | Admitting: Internal Medicine

## 2020-10-18 ENCOUNTER — Ambulatory Visit (INDEPENDENT_AMBULATORY_CARE_PROVIDER_SITE_OTHER): Payer: Self-pay | Admitting: Internal Medicine

## 2020-10-18 ENCOUNTER — Other Ambulatory Visit: Payer: Self-pay

## 2020-10-18 VITALS — BP 144/94 | HR 72 | Resp 18 | Ht 67.75 in | Wt 332.0 lb

## 2020-10-18 DIAGNOSIS — Z23 Encounter for immunization: Secondary | ICD-10-CM

## 2020-10-18 DIAGNOSIS — F410 Panic disorder [episodic paroxysmal anxiety] without agoraphobia: Secondary | ICD-10-CM

## 2020-10-18 DIAGNOSIS — E1100 Type 2 diabetes mellitus with hyperosmolarity without nonketotic hyperglycemic-hyperosmolar coma (NKHHC): Secondary | ICD-10-CM

## 2020-10-18 DIAGNOSIS — F411 Generalized anxiety disorder: Secondary | ICD-10-CM

## 2020-10-18 DIAGNOSIS — I10 Essential (primary) hypertension: Secondary | ICD-10-CM

## 2020-10-18 MED ORDER — SERTRALINE HCL 100 MG PO TABS: ORAL_TABLET | ORAL | 3 refills | Status: DC

## 2020-10-18 NOTE — Progress Notes (Signed)
Subjective:    Patient ID: Rebecca Avila, female   DOB: 1965-06-23, 55 y.o.   MRN: [id number]   HPI   DM:  A1C way back up from 6.5% in the spring--now 9.1%.  Knee, left heel and back hurting, so not as physically active.  Is not working now as her anxiety was causing her too much difficulty.  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  2.  Anxiety with panic disorder:  taking Xanax when panic attack occurs from someone else as I would not prescribe.  Has been taking Citalopram, but feels is not controlling the panic attacks.  Sertraline was prescribed in past, but felt it made her sleepy.  Was working with Dwan Bolt and found this helpful, but but Ms. Tol has moved to another place of employment.  3.  Hypertension:  states not missing meds.  Current Meds  Medication Sig   acetaminophen (TYLENOL) 500 MG tablet 2 tabs by mouth twice daily (Patient taking differently: Take 1,000 mg by mouth every 6 (six) hours as needed for moderate pain.)   amLODipine (NORVASC) 10 MG tablet Take 1 tablet (10 mg total) by mouth daily.   Blood Glucose Monitoring Suppl (ONETOUCH VERIO) w/Device KIT Check blood sugars twice daily before meals   Calcium Carb-Cholecalciferol (CALCIUM 500+D3) 500-400 MG-UNIT TABS 1 tab by mouth twice daily   citalopram (CELEXA) 20 MG tablet Take 1 tablet (20 mg total) by mouth daily.   glipiZIDE (GLIPIZIDE XL) 10 MG 24 hr tablet Take 1 tablet (10 mg total) by mouth daily with breakfast.   glucose blood (ONETOUCH VERIO) test strip Check blood glucose twice daily before meals   Lancets (ONETOUCH ULTRASOFT) lancets Check blood sugar twice daily before meals   losartan-hydrochlorothiazide (HYZAAR) 100-25 MG tablet Take 1 tablet by mouth daily.   metFORMIN (GLUCOPHAGE-XR) 500 MG 24 hr tablet 2 tabs by mouth with breakfast daily   metoprolol succinate (TOPROL-XL) 100 MG 24 hr tablet Take 1 tablet (100 mg total) by mouth daily. Take with or  immediately following a meal.   simvastatin (ZOCOR) 40 MG tablet Take 1 tablet (40 mg total) by mouth daily with breakfast.   Allergies  Allergen Reactions   Aspirin Anaphylaxis   Ibuprofen Anaphylaxis   Ace Inhibitors Cough     Review of Systems    Objective:   BP (!) 144/94 (BP Location: Right Arm, Patient Position: Sitting, Cuff Size: Normal)   Pulse 72   Resp 18   Ht 5' 7.75" (1.721 m)   Wt (!) 332 lb (150.6 kg)   BMI 50.85 kg/m   Physical Exam NAD HEENT:  PERRL, EOMI Neck:  Supple, No adenopathy, no thyromegaly Chest:  CTA CV:  RRR without murmur or rub.  Radial pulses normal and equal LE:  No edema    Assessment & Plan    DM:  Will need to get panic disorder and anxiety under control to keep other issues better controlled.  Encouraged her to find ways to be physically active as a calming measure as well.  Went over a healthy diet.    2.  Hypertension:  as above.    3.  Anxiety and Panic Disorder:  switching from Citalopram to Sertraline 50 mg daily with virtual follow up visit in 1 week.  To take before bedtime and use the sleepiness side effect to her benefit.  Encouraged her to connect with [facility] for  counseling.  Info given.    4.  HM:  Training and development officer.  Encouraged influenza vaccine in October.  We should have clinics then with [facility].

## 2020-10-18 NOTE — Patient Instructions (Addendum)
[facility]  Directions  Website Address: [address] [place], [place] [address]  Phone: [phone]  Drink a glass of water before every meal Drink 6-8 glasses of water daily Eat three meals daily Eat a protein and healthy fat with every meal (eggs,fish, chicken, [place] and limit red meats) Eat 5 servings of vegetables daily, mix the colors Eat 2 servings of fruit daily with skin, if skin is edible Use smaller plates Put food/utensils down as you chew and swallow each bite Eat at a table with friends/family at least once daily, no TV Do not eat in front of the TV  Recent studies show that people who consume all of their calories in a 12 hour period lose weight more efficiently.  For example, if you eat your first meal at 7:00 a.m., your last meal of the day should be completed by 7:00 p.m.

## 2021-01-26 DIAGNOSIS — R079 Chest pain, unspecified: Secondary | ICD-10-CM

## 2021-01-26 HISTORY — DX: Chest pain, unspecified: R07.9

## 2021-03-16 NOTE — Progress Notes (Signed)
BP improved with increase of Toprol XL.  Has follow up soon and will see if continues to acceptable range.

## 2021-04-24 ENCOUNTER — Telehealth: Payer: Self-pay

## 2021-04-24 NOTE — Telephone Encounter (Signed)
Patient would like appt after experiencing left arm pain for about 2 weeks. Pain is daily/constant, with different pain levels each day. Feels like throbbing pain. Does not recall doing anything that could have caused injury. Takes tylenol which helps for a moment, but pain always returns. First time with this issue. ?

## 2021-05-01 NOTE — Telephone Encounter (Signed)
Attempted to call pt to offer appointment, was unable to reach her ?

## 2021-05-13 NOTE — Telephone Encounter (Signed)
Attempted to call patient to offer an appointment. Unable to reach them. ?

## 2021-05-14 ENCOUNTER — Other Ambulatory Visit: Payer: Self-pay | Admitting: Internal Medicine

## 2021-05-16 NOTE — Telephone Encounter (Signed)
Attempted to call patient to offer appointment, unable to reach her. ?

## 2021-05-20 ENCOUNTER — Other Ambulatory Visit: Payer: Self-pay

## 2021-06-11 IMAGING — MG MM DIGITAL SCREENING BILAT W/ TOMO AND CAD
8 of 15 series · 8 of 40 positions shown · non-contrast
Comparison: None.

ACR Breast Density Category a: The breast tissue is almost entirely
fatty.

CLINICAL DATA: Screening.

EXAM:
DIGITAL SCREENING BILATERAL MAMMOGRAM WITH TOMOSYNTHESIS AND CAD
TECHNIQUE: Bilateral screening digital craniocaudal and mediolateral oblique
mammograms were obtained. Bilateral screening digital breast
tomosynthesis was performed. The images were evaluated with
computer-aided detection.

[R MLO synth-2D (1 of 2)]
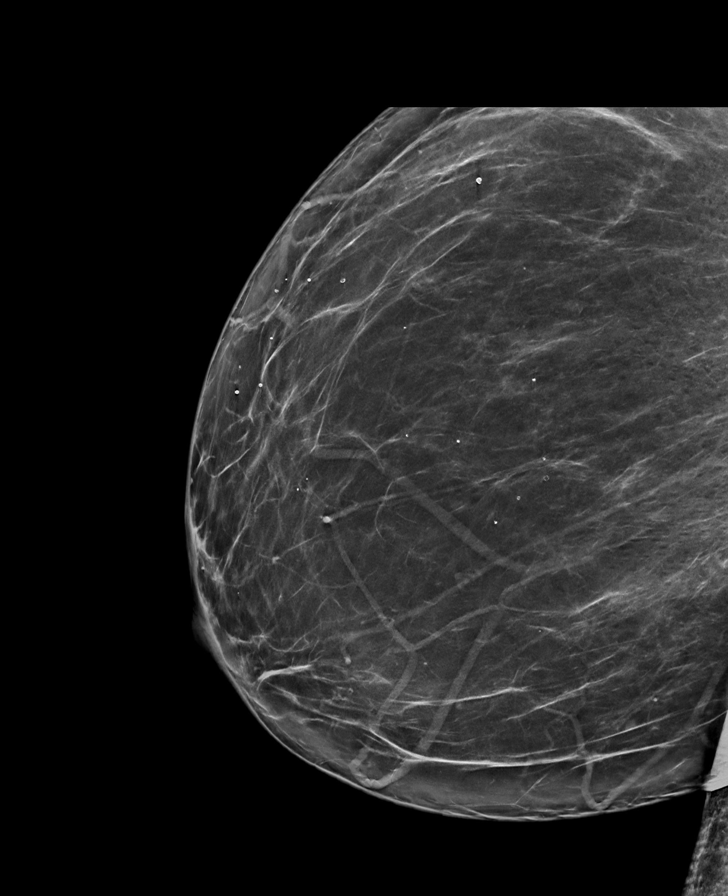

[L CC synth-2D]
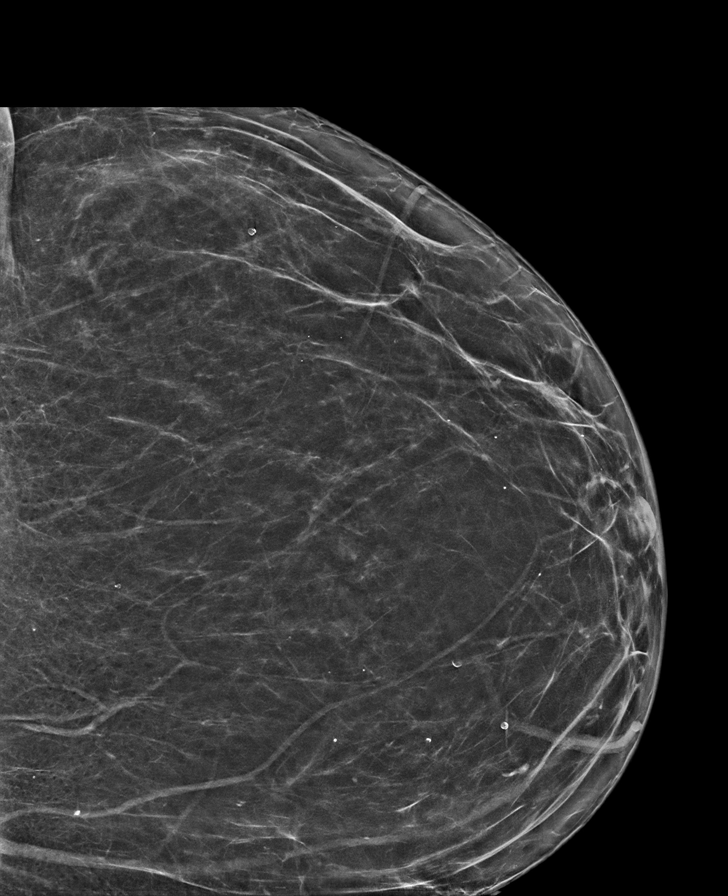

[R MLO synth-2D (2 of 2)]
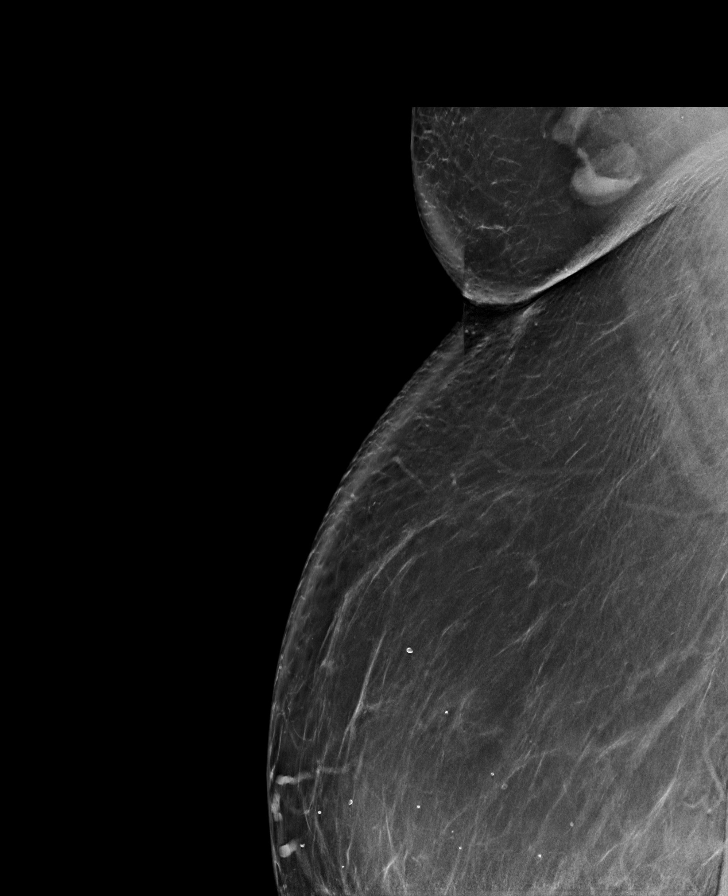

[R CV synth-2D]
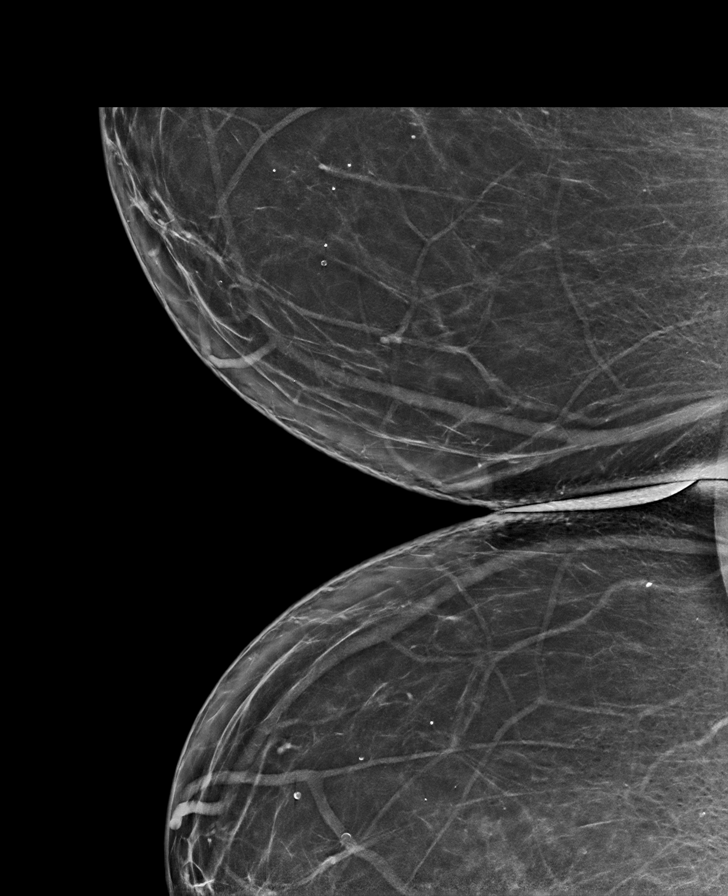

[L MLO synth-2D (1 of 2)]
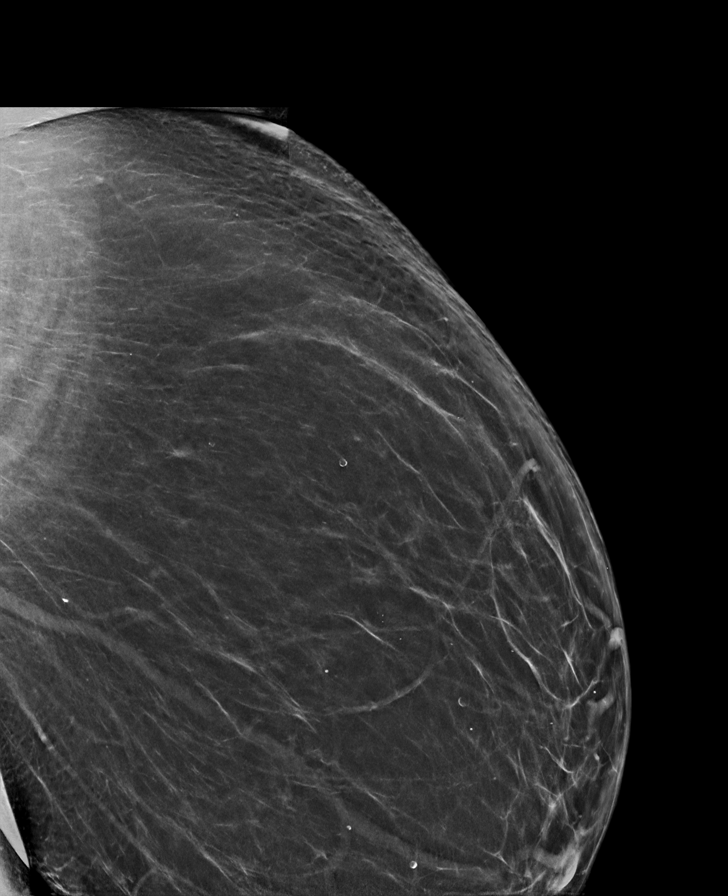

[R CC synth-2D]
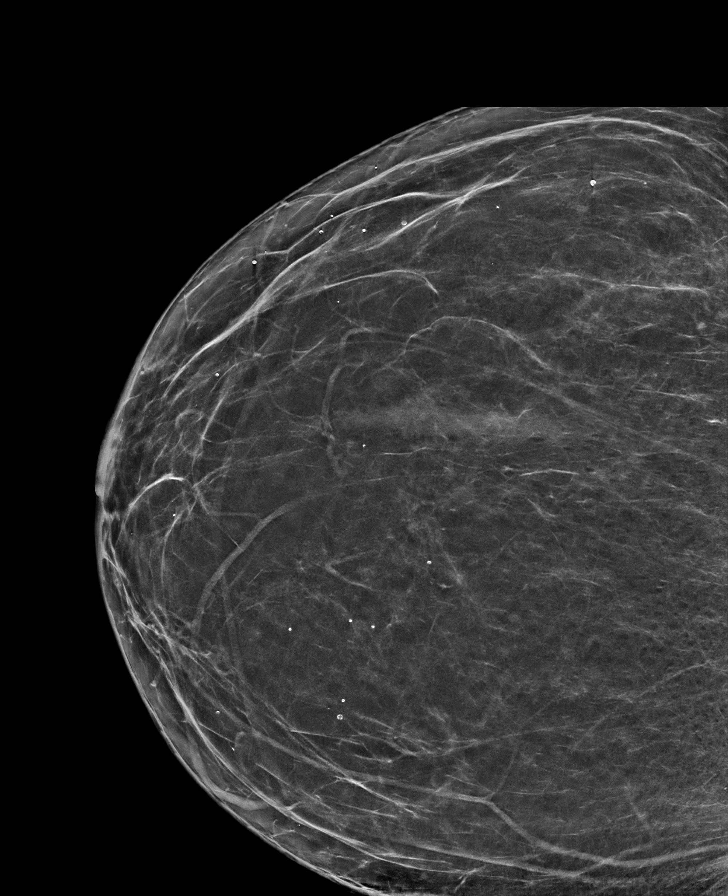

[L MLO synth-2D (2 of 2)]
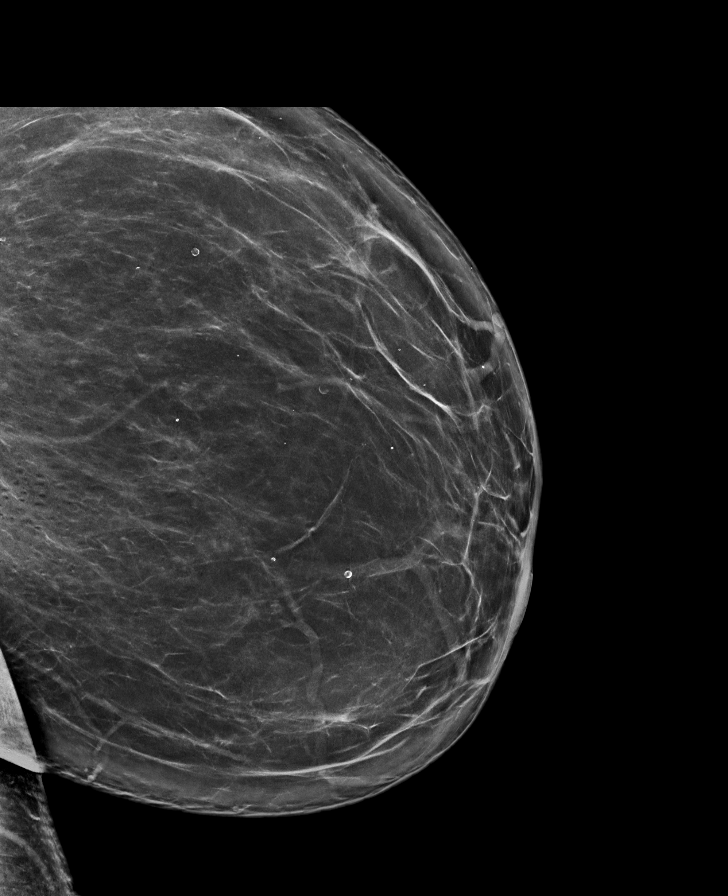

[L MLO tomo · tomo slice 72/106.0]
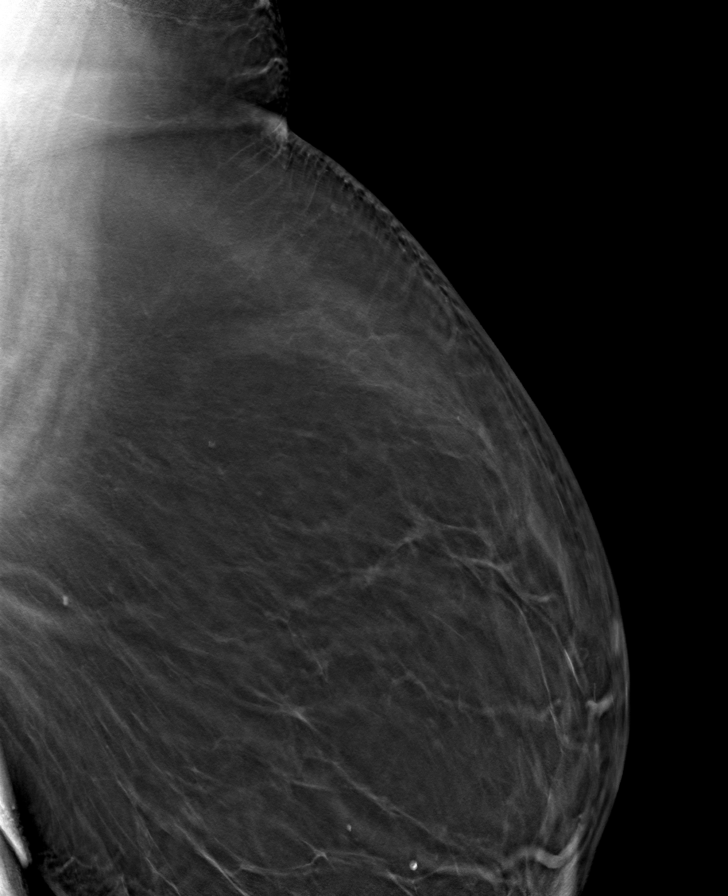

[8 of 40 positions shown; findings below may reference images not displayed]

FINDINGS: There are no findings suspicious for malignancy. The images were
evaluated with computer-aided detection.
IMPRESSION: No mammographic evidence of malignancy. A result letter of this
screening mammogram will be mailed directly to the patient.

RECOMMENDATION:
Screening mammogram in one year. (Code:6I-T-7YS)

BI-RADS CATEGORY  1: Negative.

## 2021-07-05 ENCOUNTER — Other Ambulatory Visit: Payer: Self-pay | Admitting: Internal Medicine

## 2021-07-07 ENCOUNTER — Other Ambulatory Visit: Payer: Self-pay | Admitting: Internal Medicine

## 2021-07-07 ENCOUNTER — Other Ambulatory Visit: Payer: Self-pay

## 2021-07-08 ENCOUNTER — Encounter: Payer: No Typology Code available for payment source | Admitting: Internal Medicine

## 2021-08-06 ENCOUNTER — Encounter: Payer: Self-pay | Admitting: Internal Medicine

## 2021-08-06 ENCOUNTER — Ambulatory Visit: Payer: No Typology Code available for payment source | Admitting: Internal Medicine

## 2021-08-06 VITALS — BP 150/90 | HR 64 | Resp 12 | Ht 67.75 in | Wt 332.5 lb

## 2021-08-06 DIAGNOSIS — Z6841 Body Mass Index (BMI) 40.0 and over, adult: Secondary | ICD-10-CM

## 2021-08-06 DIAGNOSIS — M79672 Pain in left foot: Secondary | ICD-10-CM | POA: Diagnosis not present

## 2021-08-06 DIAGNOSIS — I10 Essential (primary) hypertension: Secondary | ICD-10-CM

## 2021-08-06 DIAGNOSIS — R079 Chest pain, unspecified: Secondary | ICD-10-CM

## 2021-08-06 DIAGNOSIS — E782 Mixed hyperlipidemia: Secondary | ICD-10-CM | POA: Diagnosis not present

## 2021-08-06 DIAGNOSIS — E1100 Type 2 diabetes mellitus with hyperosmolarity without nonketotic hyperglycemic-hyperosmolar coma (NKHHC): Secondary | ICD-10-CM

## 2021-08-06 MED ORDER — HYDRALAZINE HCL 25 MG PO TABS: ORAL_TABLET | ORAL | 11 refills | Status: DC

## 2021-08-06 MED ORDER — METFORMIN HCL ER 500 MG PO TB24
ORAL_TABLET | ORAL | 11 refills | Status: DC
Start: 2021-08-06 — End: 2021-09-17

## 2021-08-06 MED ORDER — NITROGLYCERIN 0.4 MG SL SUBL: SUBLINGUAL_TABLET | SUBLINGUAL | 2 refills | Status: DC

## 2021-08-06 NOTE — Progress Notes (Signed)
Subjective:    Patient ID: Rebecca Avila, female   DOB: 1965/06/09, 56 y.o.   MRN: [id number]   HPI   Bump on back of left heel for about 1 year with burning sensation down achilles tendon.  Bump is tender.  Denies pain with wearing shoes that compress the bump or are against the tendon.  She is not aware of any movement or posture that triggers the discomfort.  Always has discomfort, but at times worse.   She does not recall an injury prior to onset of the pain and bump.   Prior to the pain starting, she was wearing ankle high boots at times.    2.  Chest Discomfort:  has had since January--rubs her mid sternal area.  Last episode was 2 weeks ago.  Comes and goes.    Sharp, aching pain, which does not radiate.  She does get intermittent aching in her radial forearm musculature, but not associated with the chest discomfort and alternates from side to side.   Can occur at rest with sitting or with walking.  She stands up and stretches arms if at rest and can last 10 minutes.  If walking, stops and stretches arms, takes deep breaths, slows her walk down and returns home.   Decreases as she slows her walk and then once home and sits, resolves in another 10 minutes.  Takes Tylenol, but generally resolved before can have an effect. No associated dyspnea.  Does have nausea at times, but not associated with chest discomfort. No palpitations, diaphoresis, light headedness. No LE edema, save for left heel area, No PND or orthopnea symptoms. Has never had this discomfort before.     3.  DM:  sugars running in 200.  A1C 1 year ago 9.1%.  Had to move to once daily dosing with meds as was missing evening meds.  4.  Hyperlipidemia:  at goal end of 2021, but not checked since.  States taking statin.  Current Meds  Medication Sig   acetaminophen (TYLENOL) 500 MG tablet 2 tabs by mouth twice daily (Patient taking differently: Take 1,000 mg by mouth every 6 (six) hours as needed for moderate pain.)    amLODipine (NORVASC) 10 MG tablet Take 1 tablet by mouth once daily   Blood Glucose Monitoring Suppl (ONETOUCH VERIO) w/Device KIT Check blood sugars twice daily before meals   Calcium Carb-Cholecalciferol (CALCIUM 500+D3) 500-400 MG-UNIT TABS 1 tab by mouth twice daily   glipiZIDE (GLUCOTROL XL) 10 MG 24 hr tablet Take 1 tablet by mouth once daily with breakfast   glucose blood (ONETOUCH VERIO) test strip Check blood glucose twice daily before meals   Lancets (ONETOUCH ULTRASOFT) lancets Check blood sugar twice daily before meals   losartan-hydrochlorothiazide (HYZAAR) 100-25 MG tablet Take 1 tablet by mouth daily.   metFORMIN (GLUCOPHAGE-XR) 500 MG 24 hr tablet TAKE 2 TABLETS BY MOUTH ONCE DAILY WITH BREAKFAST   metoprolol succinate (TOPROL-XL) 100 MG 24 hr tablet TAKE 1 TABLET BY MOUTH ONCE DAILY(TAKE WITH OR IMMEDIATELY FOLLOWING A MEAL)   sertraline (ZOLOFT) 100 MG tablet 1/2 tab by mouth daily until followup   simvastatin (ZOCOR) 40 MG tablet Take 1 tablet by mouth once daily with breakfast   Allergies  Allergen Reactions   Aspirin Anaphylaxis   Ibuprofen Anaphylaxis   Ace Inhibitors Cough     Review of Systems    Objective:   BP (!) 150/90 (BP Location: Right Arm, Patient Position: Sitting, Cuff Size: Large)  Pulse 64   Resp 12   Ht 5' 7.75" (1.721 m)   Wt (!) 332 lb 8 oz (150.8 kg)   BMI 50.93 kg/m   Physical Exam Morbidly obese Neck:  Supple, No adenopathy Chest:  CTA.  NT over anterior chest wall. CV:  RRR with normal S1 and S2, No S3, S4, murmur or rub.  No carotid bruits.  CArotid, radial and DP pulses normal and equal. Abd:  S NT, No HSM or mass, + BS LE:  tender over posterior calcaneus laterally and over achilles tendon distally.  ECG with RBBB, essentially unchanged from previous Assessment & Plan   Substernal chest pain, mainly with exertion:  In patient with multiple risk factors.  Improve risk factors as below.  Nitroglycerin SL as needed.  Cardiology  referral.    2.  DM:  had not been well controlled:  Increase Metformin ER to 2000 mg once daily.  To call if does not tolerate once daily dosing.  Previously was not taking her evening dose regularly.  Return for A1C with fasting labs in 2 days.  3.  Hyperlipidemia:  On simvastatin.  Return for FLP with fasting labs.  4.  Hypertension:  not controlled.  Add hydralazine 25 mg twice daily.  BP check with labs in 2 days.  5.  Left heel pain:  Not clear if achilles tendinitis.  Avoid boots or shoes that rub/move over the area.  Podiatry referral.

## 2021-08-08 ENCOUNTER — Other Ambulatory Visit: Payer: No Typology Code available for payment source

## 2021-08-08 ENCOUNTER — Other Ambulatory Visit: Payer: Self-pay | Admitting: Internal Medicine

## 2021-08-11 NOTE — Progress Notes (Unsigned)
Cardiology Office Note:    Date:  08/12/2021   ID:  Rebecca Avila, DOB August 15, 1965, MRN [id number]  PCP:  Mack Hook, MD   [facility] Providers Cardiologist:  Lenna Sciara, MD Referring MD: Mack Hook, MD   Chief Complaint/Reason for Referral: Chest pain  ASSESSMENT:    1. Precordial pain   2. Type 2 diabetes mellitus without complication, without long-term current use of insulin ([place])   3. Hypertension associated with diabetes ([place])   4. Hyperlipidemia associated with type 2 diabetes mellitus ([place])   5. CKD (chronic kidney disease) stage 2, GFR 60-89 ml/min   6. Body mass index (BMI) of 50.0 to 59.9 in adult ([place])   7. Snoring     PLAN:    In order of problems listed above: 1.  Chest pain:  We will obtain a coronary CTA and echocardiogram to evaluate further.  If the patient has mild obstructive coronary artery disease, they will require a statin (with goal LDL < 70) and aspirin, if they have high-grade disease we will need to consider optimal medical therapy and if symptoms are refractory to medical therapy, then a cardiac catheterization with possible PCI will be pursued to alleviate symptoms.  If they have high risk disease we will proceed directly to cardiac catheterization.  Follow-up in 6 months or earlier if needed. 2.  Type 2 diabetes: Start Plavix in lieu of aspirin given aspirin allergy.  Continue losartan in the form of Hyzaar.  Continue statin.  Start Jardiance 10 mg daily. 3.  Hypertension: Given lower extremity edema we will stop the patient's amlodipine.  I will have her return for a blood pressure check in 2 weeks.  Depending on her blood pressure we may need to switch over to Bystolic. 4.  Hyperlipidemia: We will check lipid panel and LP(a) today; the patient had a CMP a few days ago and LFTs were normal.  Given that she is a diabetic her goal LDL is less than 70. 5.  Chronic kidney disease: Stage II currently.  Continue losartan in form of  Hyzaar. 6.  Elevated BMI: We will refer to pharmacy for further recommendations regarding possible pharmacotherapy. 7.  Snoring: She suffers from snoring and daytime somnolence.  We will refer for sleep apnea evaluation.   Dispo:  Return in about 6 months (around 02/12/2022).      Medication Adjustments/Labs and Tests Ordered: Current medicines are reviewed at length with the patient today.  Concerns regarding medicines are outlined above.  The following changes have been made:    Labs/tests ordered: Orders Placed This Encounter  Procedures   CT CORONARY MORPH W/CTA COR W/SCORE W/CA W/CM &/OR WO/CM   Lipoprotein A (LPA)   Lipid panel   AMB Referral to [facility]   ECHOCARDIOGRAM COMPLETE   Split night study    Medication Changes: Meds ordered this encounter  Medications   clopidogrel (PLAVIX) 75 MG tablet    Sig: Take 1 tablet (75 mg total) by mouth daily.    Dispense:  90 tablet    Refill:  3   empagliflozin (JARDIANCE) 10 MG TABS tablet    Sig: Take 1 tablet (10 mg total) by mouth daily before breakfast.    Dispense:  30 tablet    Refill:  11     Current medicines are reviewed at length with the patient today.  The patient does not have concerns regarding medicines.   History of Present Illness:    FOCUSED PROBLEM LIST:  1.  Type 2 diabetes on oral medications 2.  Hypertension with ACE inhibitor induced cough 3.  Hyperlipidemia 4.  BMI of 50 5.  Stage II chronic kidney disease 6.  Aspirin allergy with anaphylaxis 7.  Right bundle branch block  The patient is a 56 y.o. female with the indicated medical history here for recommendations regarding chest pain.  Patient was seen by her primary care provider recently.  She reported chest pain since January.  It occurs in her midsternal area and comes and goes.  It can occur at rest or with walking.  She denies any dyspnea.  The patient tells me that on occasion she will get a central chest tightness.  There is  no radiation.  It can happen at rest and with exertion.  She cannot specify any alleviating or exacerbating factors.  She denies any presyncope or syncope.  She denies any palpitations or paroxysmal nocturnal dyspnea.  She does have chronic lower extremity edema of her left side.  She has had no severe bleeding or bruising.  She does not smoke.  She moved from [place] about 20 years ago.  She works full-time in [employer].    Current Medications: Current Meds  Medication Sig   acetaminophen (TYLENOL) 500 MG tablet 2 tabs by mouth twice daily (Patient taking differently: Take 1,000 mg by mouth every 6 (six) hours as needed for moderate pain.)   Blood Glucose Monitoring Suppl (ONETOUCH VERIO) w/Device KIT Check blood sugars twice daily before meals   Calcium Carb-Cholecalciferol (CALCIUM 500+D3) 500-400 MG-UNIT TABS 1 tab by mouth twice daily   clopidogrel (PLAVIX) 75 MG tablet Take 1 tablet (75 mg total) by mouth daily.   empagliflozin (JARDIANCE) 10 MG TABS tablet Take 1 tablet (10 mg total) by mouth daily before breakfast.   EPINEPHrine (EPIPEN 2-PAK) 0.3 mg/0.3 mL IJ SOAJ injection Inject 0.3 mLs (0.3 mg total) into the muscle as needed for anaphylaxis.   glipiZIDE (GLUCOTROL XL) 10 MG 24 hr tablet Take 1 tablet by mouth once daily with breakfast   glucose blood (ONETOUCH VERIO) test strip Check blood glucose twice daily before meals   hydrALAZINE (APRESOLINE) 25 MG tablet 1 tab by mouth twice daily   Lancets (ONETOUCH ULTRASOFT) lancets Check blood sugar twice daily before meals   losartan-hydrochlorothiazide (HYZAAR) 100-25 MG tablet Take 1 tablet by mouth once daily   metFORMIN (GLUCOPHAGE-XR) 500 MG 24 hr tablet TAKE 4 TABLETS BY MOUTH ONCE DAILY WITH BREAKFAST   metoprolol succinate (TOPROL-XL) 100 MG 24 hr tablet TAKE 1 TABLET BY MOUTH ONCE DAILY(TAKE WITH OR IMMEDIATELY FOLLOWING A MEAL)   nitroGLYCERIN (NITROSTAT) 0.4 MG SL tablet 1 tab under tongue as needed for Chest discomfort and  repeat every 5 minutes x 2 if discomfort not relieved   sertraline (ZOLOFT) 100 MG tablet 1/2 tab by mouth daily until followup   simvastatin (ZOCOR) 40 MG tablet Take 1 tablet by mouth once daily with breakfast   [DISCONTINUED] amLODipine (NORVASC) 10 MG tablet Take 1 tablet by mouth once daily     Allergies:    Aspirin, Ibuprofen, and Ace inhibitors   Social History:   Social History   Tobacco Use   Smoking status: Former    Packs/day: 0.00    Types: Cigarettes    Start date: 01/27/1992    Quit date: 03/29/2020    Years since quitting: 1.3   Smokeless tobacco: Never   Tobacco comments:    once monthly  Vaping Use   Vaping  Use: Never used  Substance Use Topics   Alcohol use: Yes    Comment: occ   Drug use: Never     Family Hx: Family History  Problem Relation Age of Onset   High blood pressure Father    High blood pressure Paternal Grandmother    High blood pressure Paternal Aunt    Lung cancer Mother    Cancer Brother        congenital tumor   Diabetes Neg Hx    Thyroid disease Neg Hx      Review of Systems:   Please see the history of present illness.    All other systems reviewed and are negative.     EKGs/Labs/Other Test Reviewed:    EKG:  EKG performed July 2023 that I personally reviewed demonstrates sinus rhythm with right bundle branch block.  Prior CV studies:  Extremity Dopplers 2013 negative  Other studies Reviewed: Review of the additional studies/records demonstrates: None relevant  Recent Labs: No results found for requested labs within last 365 days.   Recent Lipid Panel Lab Results  Component Value Date/Time   CHOL 127 12/08/2019 11:25 AM   TRIG 113 12/08/2019 11:25 AM   HDL 41 12/08/2019 11:25 AM   LDLCALC 65 12/08/2019 11:25 AM    Risk Assessment/Calculations:           Physical Exam:    VS:  BP 130/90   Pulse (!) 56   Ht '5\' 10"'  (1.778 m)   Wt (!) 332 lb 6.4 oz (150.8 kg)   SpO2 96%   BMI 47.69 kg/m    Wt Readings  from Last 3 Encounters:  08/12/21 (!) 332 lb 6.4 oz (150.8 kg)  08/06/21 (!) 332 lb 8 oz (150.8 kg)  10/18/20 (!) 332 lb (150.6 kg)    GENERAL:  No apparent distress, AOx3 HEENT:  No carotid bruits, +2 carotid impulses, no scleral icterus CAR: RRR no murmurs, gallops, rubs, or thrills RES:  Clear to auscultation bilaterally ABD:  Soft, nontender, nondistended, positive bowel sounds x 4 VASC:  +2 radial pulses, +2 carotid pulses, palpable pedal pulses NEURO:  CN 2-12 grossly intact; motor and sensory grossly intact PSYCH:  No active depression or anxiety EXT:  No edema, ecchymosis, or cyanosis  Signed, Early Osmond, MD  08/12/2021 10:57 AM    [address], [place], [place]  [address] Phone: [phone]; Fax: [phone]   Note:  This document was prepared using Dragon voice recognition software and may include unintentional dictation errors.

## 2021-08-12 ENCOUNTER — Ambulatory Visit: Payer: No Typology Code available for payment source | Admitting: Internal Medicine

## 2021-08-12 ENCOUNTER — Encounter: Payer: Self-pay | Admitting: Internal Medicine

## 2021-08-12 VITALS — BP 130/90 | HR 56 | Ht 70.0 in | Wt 332.4 lb

## 2021-08-12 DIAGNOSIS — E785 Hyperlipidemia, unspecified: Secondary | ICD-10-CM

## 2021-08-12 DIAGNOSIS — E1159 Type 2 diabetes mellitus with other circulatory complications: Secondary | ICD-10-CM

## 2021-08-12 DIAGNOSIS — R072 Precordial pain: Secondary | ICD-10-CM | POA: Diagnosis not present

## 2021-08-12 DIAGNOSIS — E1169 Type 2 diabetes mellitus with other specified complication: Secondary | ICD-10-CM

## 2021-08-12 DIAGNOSIS — N182 Chronic kidney disease, stage 2 (mild): Secondary | ICD-10-CM

## 2021-08-12 DIAGNOSIS — E119 Type 2 diabetes mellitus without complications: Secondary | ICD-10-CM | POA: Diagnosis not present

## 2021-08-12 DIAGNOSIS — Z6841 Body Mass Index (BMI) 40.0 and over, adult: Secondary | ICD-10-CM

## 2021-08-12 DIAGNOSIS — I152 Hypertension secondary to endocrine disorders: Secondary | ICD-10-CM

## 2021-08-12 DIAGNOSIS — R0683 Snoring: Secondary | ICD-10-CM

## 2021-08-12 MED ORDER — EMPAGLIFLOZIN 10 MG PO TABS: 10.0000 mg | ORAL_TABLET | Freq: Every day | ORAL | 11 refills | Status: DC

## 2021-08-12 MED ORDER — CLOPIDOGREL BISULFATE 75 MG PO TABS: 75.0000 mg | ORAL_TABLET | Freq: Every day | ORAL | 3 refills | Status: DC

## 2021-08-12 NOTE — Patient Instructions (Addendum)
Medication Instructions:  Your physician has recommended you make the following change in your medication:  1.) stop amlodipine 2.) start clopidogrel (Plavix) 75 mg - one tablet daily 2.) start Jardiance 10 mg - one tablet daily  *If you need a refill on your cardiac medications before your next appointment, please call your pharmacy*   Lab Work: Today: lipids/Lp(a)  If you have labs (blood work) drawn today and your tests are completely normal, you will receive your results only by: MyChart Message (if you have MyChart) OR A paper copy in the mail If you have any lab test that is abnormal or we need to change your treatment, we will call you to review the results.   Testing/Procedures: Your physician has requested that you have an echocardiogram. Echocardiography is a painless test that uses sound waves to create images of your heart. It provides your doctor with information about the size and shape of your heart and how well your heart's chambers and valves are working. This procedure takes approximately one hour. There are no restrictions for this procedure. Your physician has recommended that you have a sleep study. This test records several body functions during sleep, including: brain activity, eye movement, oxygen and carbon dioxide blood levels, heart rate and rhythm, breathing rate and rhythm, the flow of air through your mouth and nose, snoring, body muscle movements, and chest and belly movement.  Cardiac CTA - see instructions below   Follow-Up: At [facility], you and your health needs are our priority.  As part of our continuing mission to provide you with exceptional heart care, we have created designated Provider Care Teams.  These Care Teams include your primary Cardiologist (physician) and Advanced Practice Providers (APPs -  Physician Assistants and Nurse Practitioners) who all work together to provide you with the care you need, when you need it.  We recommend  signing up for the patient portal called "MyChart".  Sign up information is provided on this After Visit Summary.  MyChart is used to connect with patients for Virtual Visits (Telemedicine).  Patients are able to view lab/test results, encounter notes, upcoming appointments, etc.  Non-urgent messages can be sent to your provider as well.   To learn more about what you can do with MyChart, go to [email or web address].    Your next appointment:   6 month(s)  The format for your next appointment:   In Person  Provider:   Alverda Skeans, MD   Other Instructions You have been referred to our Clinical Pharmacist for an appointment for elevated BMI You will need an appointment for a blood pressure check in about 2 weeks    Your cardiac CT will be scheduled at [facility] [address] [place], [place] [address] [phone]  Please arrive at the [facility] and Children's Entrance (Entrance C2) of [facility] 30 minutes prior to test start time. You can use the FREE valet parking offered at entrance C (encouraged to control the heart rate for the test)  Proceed to the [facility] Radiology Department (first floor) to check-in and test prep.  All radiology patients and guests should use entrance C2 at [facility], accessed from [facility], even though the hospital's physical address listed is [address]     Please follow these instructions carefully (unless otherwise directed):  On the Night Before the Test: Be sure to Drink plenty of water. Do not consume any caffeinated/decaffeinated beverages or chocolate 12 hours prior  to your test. Do not take any antihistamines 12 hours prior to your test.  On the Day of the Test: Drink plenty of water until 1 hour prior to the test. Do not eat any food 4 hours prior to the test. You may take your regular medications prior to the test.  Take metoprolol (Lopressor) two hours prior to  test. HOLD Losartan/Hydrochlorothiazide morning of the test. FEMALES- please wear underwire-free bra if available, avoid dresses & tight clothing      After the Test: Drink plenty of water. After receiving IV contrast, you may experience a mild flushed feeling. This is normal. On occasion, you may experience a mild rash up to 24 hours after the test. This is not dangerous. If this occurs, you can take Benadryl 25 mg and increase your fluid intake. If you experience trouble breathing, this can be serious. If it is severe call 911 IMMEDIATELY. If it is mild, please call our office. If you take any of these medications: Glipizide/Metformin, Avandament, Glucavance, please do not take 48 hours after completing test unless otherwise instructed.  We will call to schedule your test 2-4 weeks out understanding that some insurance companies will need an authorization prior to the service being performed.   For non-scheduling related questions, please contact the cardiac imaging nurse navigator should you have any questions/concerns: Rockwell Alexandria, Cardiac Imaging Nurse Navigator Larey Brick, Cardiac Imaging Nurse Navigator [facility] Direct Office Dial: [phone]   For scheduling needs, including cancellations and rescheduling, please call [place], [phone].

## 2021-08-13 LAB — LIPOPROTEIN A (LPA): Lipoprotein (a): 212.5 nmol/L — ABNORMAL HIGH (ref <75.0)

## 2021-08-13 LAB — LIPID PANEL
Chol/HDL Ratio: 3.5 ratio (ref 0.0–4.4)
Cholesterol, Total: 149 mg/dL (ref 100–199)
HDL: 43 mg/dL (ref >39)
LDL Chol Calc (NIH): 80 mg/dL (ref 0–99)
Triglycerides: 147 mg/dL (ref 0–149)
VLDL Cholesterol Cal: 26 mg/dL (ref 5–40)

## 2021-08-14 ENCOUNTER — Other Ambulatory Visit: Payer: No Typology Code available for payment source

## 2021-08-15 ENCOUNTER — Telehealth: Payer: Self-pay | Admitting: Internal Medicine

## 2021-08-15 ENCOUNTER — Other Ambulatory Visit (INDEPENDENT_AMBULATORY_CARE_PROVIDER_SITE_OTHER): Payer: No Typology Code available for payment source

## 2021-08-15 VITALS — BP 156/96 | HR 64

## 2021-08-15 DIAGNOSIS — Z1159 Encounter for screening for other viral diseases: Secondary | ICD-10-CM | POA: Diagnosis not present

## 2021-08-15 DIAGNOSIS — E1100 Type 2 diabetes mellitus with hyperosmolarity without nonketotic hyperglycemic-hyperosmolar coma (NKHHC): Secondary | ICD-10-CM

## 2021-08-15 DIAGNOSIS — E1169 Type 2 diabetes mellitus with other specified complication: Secondary | ICD-10-CM

## 2021-08-15 DIAGNOSIS — Z0130 Encounter for examination of blood pressure without abnormal findings: Secondary | ICD-10-CM

## 2021-08-15 DIAGNOSIS — E782 Mixed hyperlipidemia: Secondary | ICD-10-CM

## 2021-08-15 DIAGNOSIS — Z79899 Other long term (current) drug therapy: Secondary | ICD-10-CM | POA: Diagnosis not present

## 2021-08-15 MED ORDER — ATORVASTATIN CALCIUM 40 MG PO TABS: 40.0000 mg | ORAL_TABLET | Freq: Every day | ORAL | 3 refills | Status: DC

## 2021-08-15 NOTE — Progress Notes (Unsigned)
Patient is unsure if she is taking hydralazine. Patient will call to confirm whether she has been taking it or not.

## 2021-08-15 NOTE — Telephone Encounter (Signed)
Spoke w patient and reviewed.  I discontinued simvastatin, orders placed for atorvastatin 40 mg, lipids/liver in 8 weeks and referral to Dr. Rennis Golden.  She is in agreement with plan.  Lab appointment scheduled for 9/15.  Aware she will receive call to schedule with Dr. Rennis Golden.

## 2021-08-15 NOTE — Addendum Note (Signed)
Addended by: Lendon Ka on: 08/15/2021 12:33 PM   Modules accepted: Orders

## 2021-08-15 NOTE — Telephone Encounter (Signed)
Patient returned call for lab results.  

## 2021-08-17 LAB — COMPREHENSIVE METABOLIC PANEL
ALT: 8 IU/L (ref 0–32)
AST: 6 IU/L (ref 0–40)
Albumin/Globulin Ratio: 1.6 (ref 1.2–2.2)
Albumin: 4.2 g/dL (ref 3.8–4.9)
Alkaline Phosphatase: 83 IU/L (ref 44–121)
BUN/Creatinine Ratio: 17 (ref 9–23)
BUN: 16 mg/dL (ref 6–24)
Bilirubin Total: 0.3 mg/dL (ref 0.0–1.2)
CO2: 27 mmol/L (ref 20–29)
Calcium: 9.2 mg/dL (ref 8.7–10.2)
Chloride: 98 mmol/L (ref 96–106)
Creatinine, Ser: 0.92 mg/dL (ref 0.57–1.00)
Globulin, Total: 2.7 g/dL (ref 1.5–4.5)
Glucose: 298 mg/dL — ABNORMAL HIGH (ref 70–99)
Potassium: 3.5 mmol/L (ref 3.5–5.2)
Sodium: 141 mmol/L (ref 134–144)
Total Protein: 6.9 g/dL (ref 6.0–8.5)
eGFR: 74 mL/min/{1.73_m2} (ref >59)

## 2021-08-17 LAB — HEMOGLOBIN A1C
Est. average glucose Bld gHb Est-mCnc: 249 mg/dL
Hgb A1c MFr Bld: 10.3 % — ABNORMAL HIGH (ref 4.8–5.6)

## 2021-08-17 LAB — LIPID PANEL W/O CHOL/HDL RATIO
Cholesterol, Total: 139 mg/dL (ref 100–199)
HDL: 39 mg/dL — ABNORMAL LOW (ref >39)
LDL Chol Calc (NIH): 76 mg/dL (ref 0–99)
Triglycerides: 138 mg/dL (ref 0–149)
VLDL Cholesterol Cal: 24 mg/dL (ref 5–40)

## 2021-08-17 LAB — MICROALBUMIN / CREATININE URINE RATIO
Creatinine, Urine: 111.1 mg/dL
Microalb/Creat Ratio: 14 mg/g creat (ref 0–29)
Microalbumin, Urine: 16 ug/mL

## 2021-08-17 LAB — CBC WITH DIFFERENTIAL/PLATELET
Basophils Absolute: 0 10*3/uL (ref 0.0–0.2)
Basos: 1 %
EOS (ABSOLUTE): 0.2 10*3/uL (ref 0.0–0.4)
Eos: 3 %
Hematocrit: 39 % (ref 34.0–46.6)
Hemoglobin: 12.7 g/dL (ref 11.1–15.9)
Immature Grans (Abs): 0 10*3/uL (ref 0.0–0.1)
Immature Granulocytes: 0 %
Lymphocytes Absolute: 1.9 10*3/uL (ref 0.7–3.1)
Lymphs: 28 %
MCH: 26.1 pg — ABNORMAL LOW (ref 26.6–33.0)
MCHC: 32.6 g/dL (ref 31.5–35.7)
MCV: 80 fL (ref 79–97)
Monocytes Absolute: 0.3 10*3/uL (ref 0.1–0.9)
Monocytes: 4 %
Neutrophils Absolute: 4.3 10*3/uL (ref 1.4–7.0)
Neutrophils: 64 %
Platelets: 289 10*3/uL (ref 150–450)
RBC: 4.87 x10E6/uL (ref 3.77–5.28)
RDW: 13.4 % (ref 11.7–15.4)
WBC: 6.7 10*3/uL (ref 3.4–10.8)

## 2021-08-17 LAB — HEPATITIS C ANTIBODY: Hep C Virus Ab: NONREACTIVE

## 2021-08-19 ENCOUNTER — Telehealth: Payer: Self-pay

## 2021-08-19 NOTE — Telephone Encounter (Addendum)
**Note De-Identified Nellene Courtois Obfuscation** Jardiance PA started through covermymeds.  Sharyon Cable Key: [id number] - PA Case ID: [id number] - Rx #: [id number] Outcome: Approved today Prior Auth;Coverage Start Date:08/19/2021;Coverage End Date:08/19/2022; Drug Jardiance 10MG  tablets Form [employer] Electronic PA Form (2017 NCPDP) Original Claim Info MR  I have notified [id number] [place] ([place]),  - [address] (Ph: 11-15-2004) of this approval.

## 2021-08-21 ENCOUNTER — Telehealth: Payer: Self-pay

## 2021-08-21 ENCOUNTER — Ambulatory Visit: Payer: No Typology Code available for payment source | Admitting: Podiatry

## 2021-08-21 NOTE — Telephone Encounter (Signed)
Patient called because she suspects one of her new medications is causing diarrhea. Patient is unsure which medication it is. Issue started last week. Triggered by any food that she eats. Has not noticed any blood in her stool. Has been trying to hydrate and eat starches like crackers. List of new medications include: -increased dose of metformin -atorvastatin -hydralazine -plavix

## 2021-08-22 NOTE — Telephone Encounter (Signed)
Voicemail left asking patient to reduce metformin to 2 tabs daily and call us back Monday to let us know if it helped

## 2021-09-04 NOTE — Progress Notes (Unsigned)
Patient ID: Rebecca Avila                 DOB: 06-Dec-1965                    MRN: [id number]     HPI: Rebecca Avila is a 56 y.o. female patient referred to pharmacy clinic by Dr. Ali Lowe to initiate weight loss therapy with GLP1-RA. PMH is significant for obesity complicated by chronic medical conditions including T2DM (on metformin, glipizide and Jardiance), HTN, HLD, CKDII, and anxiety. Baseline weight 331 lbs, BMI 47.5. On 08/06/21, PCP started hydralazine 25 mg BID and increased metformin. Recently seen by Dr. Ali Lowe 08/12/21 for CP, who ordered a coronary CTA and ECHO to evaluate chest pain. 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 At this visit, patient was also started on Plavix and Jardiance, simvastatin was switched to atorvastatin 40 mg and amlodipine was discontinued due to LE edema. Dr. Ali Lowe planned to have pt's BP checked in 2 weeks following those changes.  Pt presents to PharmD clinic today. Pt shares that she is only taking two tablets of metformin in the afternoon since recently prescribed higher dose of 4 tablets had given her diarrhea. She recently started taking two tablets just in the afternoon when she is home but is still have diarrhea. She has only been taking her hydralazine once daily, has trouble remembering meds taken multiple times a day. Pt endorsed taking Jardiance, Plavix, and atorvastatin and shares that her leg swelling has improved since stopping amlodipine. She has not been taking her BP at home since her machine broke. She also shares that she doesn't like taking so much medicine as she is busy at work and sometimes it is difficult to remember. She does use a pill box to help manage her medications. Overall patient has healthy diet, avoids fried and fatty foods, avoid refined carbohydrates and fast food, and mainly eats a lean protein with veggies and prefers home cooked meals over eating out. She mainly drinks water but also drinks apple  juice once a day. Patient also walks for 30 mins to 1 hour each day and averages 2,000 steps.   Baseline weight/BMI: 331 lbs, BMI 47.5  Diet: eating less fatty meats, more fiber,cut out fried foods, no rice potatoes bread - feels much better  -Breakfast: bowl of eggs, coffee, raisin bran with milk  -Lunch:baked chicken, veggies -Dinner:sometimes goes out gets a vegetable, when goes out goes to home cooked restaurants and gets a protein and veggie, does not eat [employer], burger [employer]  -Drinks: mainly water, apple juice (once a day)   Exercise: walks at work for 30 mins to an hour throughout the day , getting 2,000 steps in   Family History: father- high blood pressure, paternal grandmother - high blood pressure   Social History: does not smoke (quit 1994), occasionally drinks   Current HTN meds: Hyzaar 100-25 mg daily and Toprol XL 100 mg daily, Hydralazine 25 mg daily  Previously tried: amlodipine 10 mg daily (peripheral edema 08/12/21)   Labs: 08/15/21 CMET- Na 141, K 3.5, Scr 0.92   Lab Results  Component Value Date   HGBA1C 10.3 (H) 08/15/2021    Wt Readings from Last 1 Encounters:  09/05/21 (!) 331 lb 3.2 oz (150.2 kg)    BP Readings from Last 1 Encounters:  09/05/21 (!) 146/96   Pulse Readings from Last 1 Encounters:  09/05/21 (!) 58  Component Value Date/Time   CHOL 139 08/15/2021 0831   TRIG 138 08/15/2021 0831   HDL 39 (L) 08/15/2021 0831   CHOLHDL 3.5 08/12/2021 1114   LDLCALC 76 08/15/2021 0831    Past Medical History:  Diagnosis Date   Anemia    Anxiety    Depression    Diabetes mellitus without complication ([place])    type 2    Environmental allergies    Hypercalcemia 2014   2020--significant increase in calcium/ionized calcium with elevated PTH--hyperparathyroidism   Hypertension    Sciatica     Current Outpatient Medications on File Prior to Visit  Medication Sig Dispense Refill   acetaminophen (TYLENOL) 500 MG tablet 2 tabs by mouth twice  daily (Patient taking differently: Take 1,000 mg by mouth every 6 (six) hours as needed for moderate pain.) 30 tablet 0   atorvastatin (LIPITOR) 40 MG tablet Take 1 tablet (40 mg total) by mouth daily. 90 tablet 3   Blood Glucose Monitoring Suppl (ONETOUCH VERIO) w/Device KIT Check blood sugars twice daily before meals 1 kit 0   Calcium Carb-Cholecalciferol (CALCIUM 500+D3) 500-400 MG-UNIT TABS 1 tab by mouth twice daily 60 tablet    clopidogrel (PLAVIX) 75 MG tablet Take 1 tablet (75 mg total) by mouth daily. 90 tablet 3   empagliflozin (JARDIANCE) 10 MG TABS tablet Take 1 tablet (10 mg total) by mouth daily before breakfast. 30 tablet 11   EPINEPHrine (EPIPEN 2-PAK) 0.3 mg/0.3 mL IJ SOAJ injection Inject 0.3 mLs (0.3 mg total) into the muscle as needed for anaphylaxis. 1 each 1   glipiZIDE (GLUCOTROL XL) 10 MG 24 hr tablet Take 1 tablet by mouth once daily with breakfast 90 tablet 1   glucose blood (ONETOUCH VERIO) test strip Check blood glucose twice daily before meals 300 each 3   Lancets (ONETOUCH ULTRASOFT) lancets Check blood sugar twice daily before meals 300 each 3   losartan-hydrochlorothiazide (HYZAAR) 100-25 MG tablet Take 1 tablet by mouth once daily 30 tablet 11   metFORMIN (GLUCOPHAGE-XR) 500 MG 24 hr tablet TAKE 4 TABLETS BY MOUTH ONCE DAILY WITH BREAKFAST (Patient taking differently: 500 mg. TAKE 2 TABLETS BY MOUTH ONCE DAILY) 120 tablet 11   metoprolol succinate (TOPROL-XL) 100 MG 24 hr tablet TAKE 1 TABLET BY MOUTH ONCE DAILY(TAKE WITH OR IMMEDIATELY FOLLOWING A MEAL) 90 tablet 0   nitroGLYCERIN (NITROSTAT) 0.4 MG SL tablet 1 tab under tongue as needed for Chest discomfort and repeat every 5 minutes x 2 if discomfort not relieved 25 tablet 2   sertraline (ZOLOFT) 100 MG tablet 1/2 tab by mouth daily until followup 30 tablet 3   No current facility-administered medications on file prior to visit.    Allergies  Allergen Reactions   Aspirin Anaphylaxis   Ibuprofen Anaphylaxis    Ace Inhibitors Cough     Assessment/Plan:  1. Weight loss/DM- A1C of 10.3% is above goal of <7%. Pt decreased her metformin dose from 2g to 1g daily due to diarrhea which she's still experiencing a bit of. Tolerating Jardiance well that was started at last cards visit on 7/18. Also takes glipizide 34m daily. Discussed adding on GLP1RA for A1c reduction, weight loss, and CV benefit. Pt prefers to minimize pill burden as much as possible and [facility] however wishes to discontinue her metformin completely since she's still been experiencing some side effects. Discussed that MDarcel Bayleycan also cause GI upset but would start with low dose and titrate as tolerated, she prefers added  benefit of weight loss with this med. She plans to discuss stopping metformin with her PCP at her upcoming visit. Pt wished for [facility] rx to be sent in today, this has been done and she was provided with $25 copay card.   2. Hypertension - BP increased to 146/96 after amlodipine was stopped at last visit due to swelling, now above goal of <130/80. Will stop hydralazine as pt was only taking this once daily and prefers once daily meds if possible. Will start spironolactone 25 mg daily. Continue Hyzaar 100-25 mg daily and Toprol XL 100 mg daily.   Will have patient follow up in 1 month for BP check, BMET and Mounjaro follow up.   Pt seen with Eliseo Gum, PGY1 PharmD resident  Harlon Flor. Supple, PharmD, BCACP, [place] [address] [place], [place] [address] Phone: [phone]; Fax: [phone] 09/05/2021 12:30 PM

## 2021-09-05 ENCOUNTER — Ambulatory Visit (INDEPENDENT_AMBULATORY_CARE_PROVIDER_SITE_OTHER): Payer: 59 | Admitting: Pharmacist

## 2021-09-05 ENCOUNTER — Ambulatory Visit (HOSPITAL_COMMUNITY): Payer: 59 | Attending: Cardiovascular Disease

## 2021-09-05 VITALS — BP 146/96 | HR 58 | Wt 331.2 lb

## 2021-09-05 DIAGNOSIS — Z6841 Body Mass Index (BMI) 40.0 and over, adult: Secondary | ICD-10-CM | POA: Diagnosis present

## 2021-09-05 DIAGNOSIS — N182 Chronic kidney disease, stage 2 (mild): Secondary | ICD-10-CM

## 2021-09-05 DIAGNOSIS — E1169 Type 2 diabetes mellitus with other specified complication: Secondary | ICD-10-CM

## 2021-09-05 DIAGNOSIS — E1100 Type 2 diabetes mellitus with hyperosmolarity without nonketotic hyperglycemic-hyperosmolar coma (NKHHC): Secondary | ICD-10-CM

## 2021-09-05 DIAGNOSIS — E1159 Type 2 diabetes mellitus with other circulatory complications: Secondary | ICD-10-CM

## 2021-09-05 DIAGNOSIS — E785 Hyperlipidemia, unspecified: Secondary | ICD-10-CM | POA: Diagnosis present

## 2021-09-05 DIAGNOSIS — I152 Hypertension secondary to endocrine disorders: Secondary | ICD-10-CM | POA: Diagnosis present

## 2021-09-05 DIAGNOSIS — E119 Type 2 diabetes mellitus without complications: Secondary | ICD-10-CM

## 2021-09-05 DIAGNOSIS — R0683 Snoring: Secondary | ICD-10-CM

## 2021-09-05 DIAGNOSIS — R072 Precordial pain: Secondary | ICD-10-CM

## 2021-09-05 DIAGNOSIS — I10 Essential (primary) hypertension: Secondary | ICD-10-CM

## 2021-09-05 LAB — ECHOCARDIOGRAM COMPLETE
Area-P 1/2: 3.28 cm2
S' Lateral: 3.5 cm
Weight: 5299.2 oz

## 2021-09-05 MED ORDER — MOUNJARO 2.5 MG/0.5ML ~~LOC~~ SOAJ: 2.5000 mg | SUBCUTANEOUS | 0 refills | Status: DC

## 2021-09-05 MED ORDER — SPIRONOLACTONE 25 MG PO TABS: 25.0000 mg | ORAL_TABLET | Freq: Every day | ORAL | 5 refills | Status: DC

## 2021-09-05 NOTE — Patient Instructions (Addendum)
Your blood pressure goal is < 130/73mmHg  Start taking spironolactone 25mg  - 1 tablet once daily for your blood pressure. This will also help prevent swelling  I sent Mounjaro to your pharmacy for 1 injection once weekly. Store the medication in the fridge. Let know at your next visit if you start on this and if your PCP is ok with you stopping the metformin  Follow up in clinic in 1 month for blood pressure check and follow up

## 2021-09-11 ENCOUNTER — Emergency Department (HOSPITAL_BASED_OUTPATIENT_CLINIC_OR_DEPARTMENT_OTHER)
Admission: EM | Admit: 2021-09-11 | Discharge: 2021-09-11 | Disposition: A | Discharge status: Home / Self Care | Payer: 59 | Source: Home / Self Care | Attending: Emergency Medicine | Admitting: Emergency Medicine

## 2021-09-11 ENCOUNTER — Encounter (HOSPITAL_BASED_OUTPATIENT_CLINIC_OR_DEPARTMENT_OTHER): Payer: Self-pay

## 2021-09-11 ENCOUNTER — Emergency Department (HOSPITAL_BASED_OUTPATIENT_CLINIC_OR_DEPARTMENT_OTHER): Payer: 59 | Admitting: Radiology

## 2021-09-11 ENCOUNTER — Other Ambulatory Visit: Payer: Self-pay

## 2021-09-11 DIAGNOSIS — M25552 Pain in left hip: Secondary | ICD-10-CM | POA: Insufficient documentation

## 2021-09-11 DIAGNOSIS — Z7984 Long term (current) use of oral hypoglycemic drugs: Secondary | ICD-10-CM | POA: Insufficient documentation

## 2021-09-11 DIAGNOSIS — M79652 Pain in left thigh: Secondary | ICD-10-CM | POA: Insufficient documentation

## 2021-09-11 DIAGNOSIS — I10 Essential (primary) hypertension: Secondary | ICD-10-CM | POA: Insufficient documentation

## 2021-09-11 DIAGNOSIS — M25512 Pain in left shoulder: Secondary | ICD-10-CM | POA: Insufficient documentation

## 2021-09-11 DIAGNOSIS — M2559 Pain in other specified joint: Secondary | ICD-10-CM | POA: Diagnosis not present

## 2021-09-11 DIAGNOSIS — Z79899 Other long term (current) drug therapy: Secondary | ICD-10-CM | POA: Insufficient documentation

## 2021-09-11 DIAGNOSIS — E119 Type 2 diabetes mellitus without complications: Secondary | ICD-10-CM | POA: Insufficient documentation

## 2021-09-11 DIAGNOSIS — M069 Rheumatoid arthritis, unspecified: Secondary | ICD-10-CM | POA: Diagnosis not present

## 2021-09-11 MED ORDER — CYCLOBENZAPRINE HCL 5 MG PO TABS
5.0000 mg | ORAL_TABLET | Freq: Once | ORAL | Status: AC
  Administered 2021-09-11: 5 mg via ORAL
  Filled 2021-09-11: qty 1

## 2021-09-11 MED ORDER — CYCLOBENZAPRINE HCL 10 MG PO TABS: 10.0000 mg | ORAL_TABLET | Freq: Two times a day (BID) | ORAL | 0 refills | Status: DC | PRN

## 2021-09-11 MED ORDER — OXYCODONE HCL 5 MG PO TABS
5.0000 mg | ORAL_TABLET | Freq: Once | ORAL | Status: AC
  Administered 2021-09-11: 5 mg via ORAL
  Filled 2021-09-11: qty 1

## 2021-09-11 NOTE — ED Provider Notes (Signed)
[facility] EMERGENCY DEPT Provider Note   CSN: [id number] Arrival date & time: 09/11/21  1305     History  Chief Complaint  Patient presents with   Hip Pain    Rebecca Avila is a 56 y.o. female.  With a past medical history of, hypertension, uncontrolled diabetes, panic disorder, left trochanteric bursitis who presents to the ED for evaluation of left hip pain and left shoulder pain.  Patient states pain started 2 to 3 days ago.  States pain in the left hip became significantly worse today while at work at approximately 12 PM.  States she is now unable to walk due to pain.  Has taken 1500 mg Tylenol recently for the pain.  States the pain is currently 10 out of 10.  Localizes pain to the lateral aspect of the left thigh.  Reports shoulder pain only present during overhead movements.  Denies fevers, chills, leg swelling, calf pain.   Hip Pain       Home Medications Prior to Admission medications   Medication Sig Start Date End Date Taking? Authorizing Provider  cyclobenzaprine (FLEXERIL) 10 MG tablet Take 1 tablet (10 mg total) by mouth 2 (two) times daily as needed for muscle spasms. 09/11/21  Yes Tenelle Andreason, Grafton Folk, PA-C  acetaminophen (TYLENOL) 500 MG tablet 2 tabs by mouth twice daily Patient taking differently: Take 1,000 mg by mouth every 6 (six) hours as needed for moderate pain. 01/25/19   Mack Hook, MD  atorvastatin (LIPITOR) 40 MG tablet Take 1 tablet (40 mg total) by mouth daily. 08/15/21   Early Osmond, MD  Blood Glucose Monitoring Suppl [facility] VERIO) w/Device KIT Check blood sugars twice daily before meals 11/06/19   Mack Hook, MD  Calcium Carb-Cholecalciferol (CALCIUM 500+D3) 500-400 MG-UNIT TABS 1 tab by mouth twice daily 07/03/20   Mack Hook, MD  clopidogrel (PLAVIX) 75 MG tablet Take 1 tablet (75 mg total) by mouth daily. 08/12/21   Early Osmond, MD  empagliflozin (JARDIANCE) 10 MG TABS tablet Take 1 tablet (10 mg  total) by mouth daily before breakfast. 08/12/21   Early Osmond, MD  EPINEPHrine (EPIPEN 2-PAK) 0.3 mg/0.3 mL IJ SOAJ injection Inject 0.3 mLs (0.3 mg total) into the muscle as needed for anaphylaxis. 02/01/19   Mack Hook, MD  glipiZIDE (GLUCOTROL XL) 10 MG 24 hr tablet Take 1 tablet by mouth once daily with breakfast 05/20/21   Mack Hook, MD  glucose blood (ONETOUCH VERIO) test strip Check blood glucose twice daily before meals 04/18/20   Mack Hook, MD  Lancets [facility] ULTRASOFT) lancets Check blood sugar twice daily before meals 04/18/20   Mack Hook, MD  losartan-hydrochlorothiazide [facility]) 100-25 MG tablet Take 1 tablet by mouth once daily 08/08/21   Mack Hook, MD  metFORMIN (GLUCOPHAGE-XR) 500 MG 24 hr tablet TAKE 4 TABLETS BY MOUTH ONCE DAILY WITH BREAKFAST Patient taking differently: 500 mg. TAKE 2 TABLETS BY MOUTH ONCE DAILY 08/06/21   Mack Hook, MD  metoprolol succinate (TOPROL-XL) 100 MG 24 hr tablet TAKE 1 TABLET BY MOUTH ONCE DAILY(TAKE WITH OR IMMEDIATELY FOLLOWING A MEAL) 07/07/21   Mack Hook, MD  nitroGLYCERIN (NITROSTAT) 0.4 MG SL tablet 1 tab under tongue as needed for Chest discomfort and repeat every 5 minutes x 2 if discomfort not relieved 08/06/21   Mack Hook, MD  sertraline (ZOLOFT) 100 MG tablet 1/2 tab by mouth daily until followup 10/18/20   Mack Hook, MD  spironolactone (ALDACTONE) 25 MG tablet Take 1 tablet (25  mg total) by mouth daily. 09/05/21   Early Osmond, MD  tirzepatide [facility]) 2.5 MG/0.5ML Pen Inject 2.5 mg into the skin once a week. 09/05/21   Early Osmond, MD      Allergies    Aspirin, Ibuprofen, Amlodipine, and Ace inhibitors    Review of Systems   Review of Systems  Constitutional:  Negative for chills, fatigue and fever.  Musculoskeletal:  Positive for arthralgias and myalgias. Negative for back pain.  Skin: Negative.   Neurological:  Negative for dizziness,  weakness and light-headedness.  All other systems reviewed and are negative.   Physical Exam Updated Vital Signs BP (!) 147/79   Pulse (!) 59   Temp 97.6 F (36.4 C) (Oral)   Resp 16   Ht _0  (1.778 m)   Wt (!) 150.2 kg   SpO2 97%   BMI 47.51 kg/m  Physical Exam Vitals and nursing note reviewed.  Constitutional:      General: She is not in acute distress.    Appearance: Normal appearance. She is normal weight. She is not ill-appearing.  HENT:     Head: Normocephalic and atraumatic.  Pulmonary:     Effort: Pulmonary effort is normal. No respiratory distress.  Abdominal:     General: Abdomen is flat.  Musculoskeletal:        General: Normal range of motion.     Cervical back: Neck supple.     Right upper leg: Tenderness present. No swelling, edema or deformity.     Left upper leg: Normal.     Comments: Tenderness along the lateral aspect of the left upper leg.  No edema or erythema.  DP pulses 2+ bilaterally  Skin:    General: Skin is warm and dry.  Neurological:     Mental Status: She is alert and oriented to person, place, and time.  Psychiatric:        Mood and Affect: Mood normal.        Behavior: Behavior normal.     ED Results / Procedures / Treatments   Labs (all labs ordered are listed, but only abnormal results are displayed) Labs Reviewed - No data to display  EKG None  Radiology DG Hip Unilat With Pelvis 2-3 Views Left  Result Date: 09/11/2021 CLINICAL DATA:  Left hip pain EXAM: DG HIP (WITH OR WITHOUT PELVIS) 2-3V LEFT COMPARISON:  None Available. FINDINGS: Alignment is anatomic. No acute fracture. Joint space is preserved. No intrinsic osseous lesion. IMPRESSION: No significant osseous abnormality. Electronically Signed   By: Macy Mis M.D.   On: 09/11/2021 14:58   DG Shoulder Left  Result Date: 09/11/2021 CLINICAL DATA:  LEFT hip pain for 2 days EXAM: LEFT SHOULDER - 2+ VIEW COMPARISON:  None Available. FINDINGS: Glenohumeral joint is  intact. No evidence of scapular fracture or humeral fracture. The acromioclavicular joint is intact. IMPRESSION: No fracture or dislocation. Electronically Signed   By: Suzy Bouchard M.D.   On: 09/11/2021 14:57    Procedures Procedures    Medications Ordered in ED Medications  oxyCODONE (Oxy IR/ROXICODONE) immediate release tablet 5 mg (5 mg Oral Given 09/11/21 1350)  cyclobenzaprine (FLEXERIL) tablet 5 mg (5 mg Oral Given 09/11/21 1517)    ED Course/ Medical Decision Making/ A&P Clinical Course as of 09/11/21 1625  Thu Sep 11, 2021  1504 Upon reevaluation, patient still states that the pain is a 10 out of 10. [AS]    Clinical Course User Index [AS] Roylene Reason, PA-C  Medical Decision Making This patient presents to the ED for concern of left hip pain and left shoulder pain, this involves an extensive number of treatment options, and is a complaint that carries with it a high risk of complications and morbidity.  The differential diagnosis includes trochanteric bursitis, avascular necrosis, DVT, muscle strain, IT band syndrome, fracture, septic arthritis   Co morbidities that complicate the patient evaluation       Panic disorder, hypertension, previous left trochanteric bursitis  My initial workup includes x-ray left hip and left shoulder to assess for osseous abnormalities  Additional history obtained from: Nursing notes from this visit. Prior ED visit on 10/11/2013,  09/19/2013 for evaluation of low back and left hip pain Previous records within EMR system  Family husband is present and provides a portion of the history   I ordered, reviewed and interpreted labs which include:   I ordered imaging studies including x-ray left hip, x-ray left shoulder I independently visualized and interpreted imaging which showed no acute osseous abnormalities I agree with the radiologist interpretation  Cardiac Monitoring:       The patient was not  maintained on a cardiac monitor.   Consultations Obtained:  No Consults  Afebrile and hemodynamically stable.  X-ray imaging unremarkable.  Low suspicion for x-ray.  Very low suspicion for DVT due to absence of leg swelling, recent immobilization, erythema.  Pulses present and normal.  Did not report improvement with oxycodone.  I suspect patient sustained a muscular injury muscle such as strain or IT band syndrome or trochanteric bursitis.  Patient reports shoulder pain to be absent at the time of discharge.  Patient did report improvement with Flexeril.  We will send a prescription for Flexeril and advised patient not to drive or operate heavy machinery while taking this medication.  We will also send patient home with a walker and advised patient to follow-up with orthopedics for further evaluation.  At this time there does not appear to be any evidence of an acute emergency medical condition and the patient appears stable for discharge with appropriate outpatient follow up. Diagnosis was discussed with patient who verbalizes understanding of care plan and is agreeable to discharge. I have discussed return precautions with patient and husband who verbalizes understanding. Patient encouraged to follow-up with their PCP within 1 week. All questions answered.  Patient's case discussed with Dr. Ashok Cordia who agrees with plan to discharge with follow-up.   Note: Portions of this report may have been transcribed using voice recognition software. Every effort was made to ensure accuracy; however, inadvertent computerized transcription errors may still be present.   Amount and/or Complexity of Data Reviewed Radiology: ordered.  Risk Prescription drug management.          Final Clinical Impression(s) / ED Diagnoses Final diagnoses:  Acute hip pain, left  Acute pain of left shoulder    Rx / DC Orders ED Discharge Orders          Ordered    cyclobenzaprine (FLEXERIL) 10 MG tablet  2 times  daily PRN        09/11/21 1559    Walker standard  Status:  Canceled        09/11/21 Grant, Grafton Folk, PA-C 09/11/21 1636    Lajean Saver, MD 09/12/21 1527

## 2021-09-11 NOTE — ED Notes (Signed)
EDP at bedside  

## 2021-09-11 NOTE — ED Triage Notes (Signed)
Patient here POV from Home.  Endorses Pain to Left Hip that Radiates to Left Shoulder. Pain to hip began 2 Days ago and began to radiate to Left Shoulder today. Pain is also to Left Arm and Left Leg. Constant and Sharp.   No Acute Trauma or Injury Noted. OTC Medications note Effective.  NAD Noted during Triage. A&Ox4. GCS 15. BIB Wheelchair.

## 2021-09-11 NOTE — ED Notes (Signed)
Reviewed AVS/discharge instruction with patient. Time allotted for and all questions answered. Patient is agreeable for d/c and escorted to ed exit by staff.  

## 2021-09-11 NOTE — Discharge Instructions (Addendum)
You have been seen today for your complaint of left hip pain and left shoulder pain. Your imaging was reassuring and showed no abnormalities. Your discharge medications include Flexeril.  This is a muscle relaxant.  You may take this 2 times a day as needed for pain.  You should not drive or operate heavy machinery while taking this medication.. Home care instructions are as follows:  You should perform gentle range of motion exercises for your hip and shoulder.  You may also take Tylenol as needed for pain. Follow up with: Your primary care provider within 1 week if symptoms do not resolve Please seek immediate medical care if you develop any of the following symptoms: You fall. You have a sudden increase in pain and swelling in your hip. Your hip is red or swollen or very tender to touch. At this time there does not appear to be the presence of an emergent medical condition, however there is always the potential for conditions to change. Please read and follow the below instructions.  Do not take your medicine if  develop an itchy rash, swelling in your mouth or lips, or difficulty breathing; call 911 and seek immediate emergency medical attention if this occurs.  You may review your lab tests and imaging results in their entirety on your MyChart account.  Please discuss all results of fully with your primary care provider and other specialist at your follow-up visit.  Note: Portions of this text may have been transcribed using voice recognition software. Every effort was made to ensure accuracy; however, inadvertent computerized transcription errors may still be present.

## 2021-09-11 NOTE — ED Notes (Signed)
Patient ambulated to restroom with walker, reports pain but denies weakness. Steady gate.

## 2021-09-13 ENCOUNTER — Other Ambulatory Visit: Payer: Self-pay

## 2021-09-13 ENCOUNTER — Inpatient Hospital Stay (HOSPITAL_COMMUNITY)
Admission: EM | Admit: 2021-09-13 | Discharge: 2021-09-17 | DRG: 546 | Disposition: A | Discharge status: Home / Self Care | Payer: 59 | Attending: Internal Medicine | Admitting: Internal Medicine

## 2021-09-13 ENCOUNTER — Emergency Department (HOSPITAL_COMMUNITY): Payer: 59

## 2021-09-13 ENCOUNTER — Encounter (HOSPITAL_COMMUNITY): Payer: Self-pay

## 2021-09-13 DIAGNOSIS — Z7985 Long-term (current) use of injectable non-insulin antidiabetic drugs: Secondary | ICD-10-CM

## 2021-09-13 DIAGNOSIS — Z7984 Long term (current) use of oral hypoglycemic drugs: Secondary | ICD-10-CM

## 2021-09-13 DIAGNOSIS — E785 Hyperlipidemia, unspecified: Secondary | ICD-10-CM | POA: Diagnosis present

## 2021-09-13 DIAGNOSIS — Z20822 Contact with and (suspected) exposure to covid-19: Secondary | ICD-10-CM | POA: Diagnosis present

## 2021-09-13 DIAGNOSIS — M069 Rheumatoid arthritis, unspecified: Principal | ICD-10-CM | POA: Diagnosis present

## 2021-09-13 DIAGNOSIS — E892 Postprocedural hypoparathyroidism: Secondary | ICD-10-CM | POA: Diagnosis present

## 2021-09-13 DIAGNOSIS — M2550 Pain in unspecified joint: Secondary | ICD-10-CM | POA: Diagnosis not present

## 2021-09-13 DIAGNOSIS — Z888 Allergy status to other drugs, medicaments and biological substances status: Secondary | ICD-10-CM

## 2021-09-13 DIAGNOSIS — Z7902 Long term (current) use of antithrombotics/antiplatelets: Secondary | ICD-10-CM

## 2021-09-13 DIAGNOSIS — R262 Difficulty in walking, not elsewhere classified: Secondary | ICD-10-CM | POA: Diagnosis present

## 2021-09-13 DIAGNOSIS — Z794 Long term (current) use of insulin: Secondary | ICD-10-CM

## 2021-09-13 DIAGNOSIS — E1159 Type 2 diabetes mellitus with other circulatory complications: Secondary | ICD-10-CM | POA: Diagnosis present

## 2021-09-13 DIAGNOSIS — M5430 Sciatica, unspecified side: Secondary | ICD-10-CM | POA: Diagnosis present

## 2021-09-13 DIAGNOSIS — I152 Hypertension secondary to endocrine disorders: Secondary | ICD-10-CM | POA: Diagnosis present

## 2021-09-13 DIAGNOSIS — Z6841 Body Mass Index (BMI) 40.0 and over, adult: Secondary | ICD-10-CM

## 2021-09-13 DIAGNOSIS — R600 Localized edema: Secondary | ICD-10-CM | POA: Diagnosis present

## 2021-09-13 DIAGNOSIS — E876 Hypokalemia: Secondary | ICD-10-CM | POA: Diagnosis present

## 2021-09-13 DIAGNOSIS — E1165 Type 2 diabetes mellitus with hyperglycemia: Secondary | ICD-10-CM | POA: Diagnosis present

## 2021-09-13 DIAGNOSIS — F32A Depression, unspecified: Secondary | ICD-10-CM | POA: Diagnosis present

## 2021-09-13 DIAGNOSIS — Z79899 Other long term (current) drug therapy: Secondary | ICD-10-CM

## 2021-09-13 DIAGNOSIS — E1100 Type 2 diabetes mellitus with hyperosmolarity without nonketotic hyperglycemic-hyperosmolar coma (NKHHC): Secondary | ICD-10-CM

## 2021-09-13 DIAGNOSIS — Z886 Allergy status to analgesic agent status: Secondary | ICD-10-CM

## 2021-09-13 DIAGNOSIS — Z87891 Personal history of nicotine dependence: Secondary | ICD-10-CM

## 2021-09-13 DIAGNOSIS — Z90710 Acquired absence of both cervix and uterus: Secondary | ICD-10-CM

## 2021-09-13 DIAGNOSIS — E1169 Type 2 diabetes mellitus with other specified complication: Secondary | ICD-10-CM | POA: Diagnosis present

## 2021-09-13 DIAGNOSIS — R7982 Elevated C-reactive protein (CRP): Secondary | ICD-10-CM | POA: Diagnosis present

## 2021-09-13 DIAGNOSIS — I4510 Unspecified right bundle-branch block: Secondary | ICD-10-CM | POA: Diagnosis present

## 2021-09-13 DIAGNOSIS — I10 Essential (primary) hypertension: Secondary | ICD-10-CM | POA: Diagnosis present

## 2021-09-13 DIAGNOSIS — F410 Panic disorder [episodic paroxysmal anxiety] without agoraphobia: Secondary | ICD-10-CM | POA: Diagnosis present

## 2021-09-13 DIAGNOSIS — E119 Type 2 diabetes mellitus without complications: Secondary | ICD-10-CM

## 2021-09-13 LAB — URINALYSIS, ROUTINE W REFLEX MICROSCOPIC
Bilirubin Urine: NEGATIVE
Glucose, UA: NEGATIVE mg/dL
Hgb urine dipstick: NEGATIVE
Ketones, ur: NEGATIVE mg/dL
Leukocytes,Ua: NEGATIVE
Nitrite: NEGATIVE
Protein, ur: NEGATIVE mg/dL
Specific Gravity, Urine: 1.011 (ref 1.005–1.030)
pH: 6 (ref 5.0–8.0)

## 2021-09-13 LAB — COMPREHENSIVE METABOLIC PANEL
ALT: 9 U/L (ref 0–44)
AST: 7 U/L — ABNORMAL LOW (ref 15–41)
Albumin: 3.5 g/dL (ref 3.5–5.0)
Alkaline Phosphatase: 59 U/L (ref 38–126)
Anion gap: 12 (ref 5–15)
BUN: 9 mg/dL (ref 6–20)
CO2: 25 mmol/L (ref 22–32)
Calcium: 8.9 mg/dL (ref 8.9–10.3)
Chloride: 102 mmol/L (ref 98–111)
Creatinine, Ser: 0.97 mg/dL (ref 0.44–1.00)
GFR, Estimated: >60 mL/min (ref >60)
Glucose, Bld: 141 mg/dL — ABNORMAL HIGH (ref 70–99)
Potassium: 3.3 mmol/L — ABNORMAL LOW (ref 3.5–5.1)
Sodium: 139 mmol/L (ref 135–145)
Total Bilirubin: 0.5 mg/dL (ref 0.3–1.2)
Total Protein: 7.8 g/dL (ref 6.5–8.1)

## 2021-09-13 LAB — URIC ACID: Uric Acid, Serum: 7.2 mg/dL — ABNORMAL HIGH (ref 2.5–7.1)

## 2021-09-13 LAB — CBC WITH DIFFERENTIAL/PLATELET
Abs Immature Granulocytes: 0.05 10*3/uL (ref 0.00–0.07)
Basophils Absolute: 0 10*3/uL (ref 0.0–0.1)
Basophils Relative: 0 %
Eosinophils Absolute: 0.1 10*3/uL (ref 0.0–0.5)
Eosinophils Relative: 0 %
HCT: 37.1 % (ref 36.0–46.0)
Hemoglobin: 11.8 g/dL — ABNORMAL LOW (ref 12.0–15.0)
Immature Granulocytes: 0 %
Lymphocytes Relative: 15 %
Lymphs Abs: 1.7 10*3/uL (ref 0.7–4.0)
MCH: 26.2 pg (ref 26.0–34.0)
MCHC: 31.8 g/dL (ref 30.0–36.0)
MCV: 82.4 fL (ref 80.0–100.0)
Monocytes Absolute: 0.5 10*3/uL (ref 0.1–1.0)
Monocytes Relative: 5 %
Neutro Abs: 8.9 10*3/uL — ABNORMAL HIGH (ref 1.7–7.7)
Neutrophils Relative %: 80 %
Platelets: 300 10*3/uL (ref 150–400)
RBC: 4.5 MIL/uL (ref 3.87–5.11)
RDW: 14.2 % (ref 11.5–15.5)
WBC: 11.2 10*3/uL — ABNORMAL HIGH (ref 4.0–10.5)
nRBC: 0 % (ref 0.0–0.2)

## 2021-09-13 LAB — RESP PANEL BY RT-PCR (FLU A&B, COVID) ARPGX2
Influenza A by PCR: NEGATIVE
Influenza B by PCR: NEGATIVE
SARS Coronavirus 2 by RT PCR: NEGATIVE

## 2021-09-13 LAB — BRAIN NATRIURETIC PEPTIDE: B Natriuretic Peptide: 70.5 pg/mL (ref 0.0–100.0)

## 2021-09-13 LAB — CK: Total CK: 31 U/L — ABNORMAL LOW (ref 38–234)

## 2021-09-13 LAB — TROPONIN I (HIGH SENSITIVITY)
Troponin I (High Sensitivity): 4 ng/L (ref <18)
Troponin I (High Sensitivity): 4 ng/L (ref <18)

## 2021-09-13 LAB — GLUCOSE, CAPILLARY: Glucose-Capillary: 142 mg/dL — ABNORMAL HIGH (ref 70–99)

## 2021-09-13 LAB — LACTIC ACID, PLASMA: Lactic Acid, Venous: 1.1 mmol/L (ref 0.5–1.9)

## 2021-09-13 LAB — SEDIMENTATION RATE: Sed Rate: 125 mm/hr — ABNORMAL HIGH (ref 0–22)

## 2021-09-13 MED ORDER — INSULIN ASPART 100 UNIT/ML IJ SOLN
0.0000 [IU] | Freq: Three times a day (TID) | INTRAMUSCULAR | Status: DC
  Administered 2021-09-14 (×2): 5 [IU] via SUBCUTANEOUS
  Administered 2021-09-14: 2 [IU] via SUBCUTANEOUS
  Administered 2021-09-15: 15 [IU] via SUBCUTANEOUS
  Administered 2021-09-15: 11 [IU] via SUBCUTANEOUS
  Administered 2021-09-15: 8 [IU] via SUBCUTANEOUS
  Administered 2021-09-16: 5 [IU] via SUBCUTANEOUS
  Administered 2021-09-16: 8 [IU] via SUBCUTANEOUS
  Administered 2021-09-16: 11 [IU] via SUBCUTANEOUS
  Administered 2021-09-17 (×2): 8 [IU] via SUBCUTANEOUS
  Filled 2021-09-13: qty 0.15

## 2021-09-13 MED ORDER — PREDNISONE 20 MG PO TABS: 40.0000 mg | ORAL_TABLET | Freq: Every day | ORAL | Status: DC

## 2021-09-13 MED ORDER — METOPROLOL SUCCINATE ER 50 MG PO TB24
100.0000 mg | ORAL_TABLET | Freq: Every day | ORAL | Status: DC
  Administered 2021-09-14 – 2021-09-17 (×4): 100 mg via ORAL
  Filled 2021-09-13 (×4): qty 2

## 2021-09-13 MED ORDER — MORPHINE SULFATE (PF) 4 MG/ML IV SOLN
6.0000 mg | Freq: Once | INTRAVENOUS | Status: AC
  Administered 2021-09-13: 6 mg via INTRAVENOUS
  Filled 2021-09-13: qty 2

## 2021-09-13 MED ORDER — LOSARTAN POTASSIUM 50 MG PO TABS
100.0000 mg | ORAL_TABLET | Freq: Every day | ORAL | Status: DC
  Administered 2021-09-14 – 2021-09-17 (×4): 100 mg via ORAL
  Filled 2021-09-13 (×4): qty 2

## 2021-09-13 MED ORDER — ACETAMINOPHEN 650 MG RE SUPP: 650.0000 mg | Freq: Four times a day (QID) | RECTAL | Status: DC | PRN

## 2021-09-13 MED ORDER — INSULIN ASPART 100 UNIT/ML IJ SOLN
0.0000 [IU] | Freq: Every day | INTRAMUSCULAR | Status: DC
  Administered 2021-09-14: 4 [IU] via SUBCUTANEOUS
  Administered 2021-09-15 – 2021-09-16 (×2): 3 [IU] via SUBCUTANEOUS
  Filled 2021-09-13: qty 0.05

## 2021-09-13 MED ORDER — ONDANSETRON HCL 4 MG PO TABS: 4.0000 mg | ORAL_TABLET | Freq: Four times a day (QID) | ORAL | Status: DC | PRN

## 2021-09-13 MED ORDER — CYCLOBENZAPRINE HCL 10 MG PO TABS
10.0000 mg | ORAL_TABLET | Freq: Three times a day (TID) | ORAL | Status: DC | PRN
  Administered 2021-09-13 – 2021-09-17 (×4): 10 mg via ORAL
  Filled 2021-09-13 (×5): qty 1

## 2021-09-13 MED ORDER — MORPHINE SULFATE (PF) 2 MG/ML IV SOLN
2.0000 mg | INTRAVENOUS | Status: DC | PRN
  Administered 2021-09-13 – 2021-09-14 (×4): 2 mg via INTRAVENOUS
  Filled 2021-09-13 (×4): qty 1

## 2021-09-13 MED ORDER — POTASSIUM CHLORIDE 20 MEQ PO PACK
40.0000 meq | PACK | Freq: Once | ORAL | Status: AC
  Administered 2021-09-13: 40 meq via ORAL
  Filled 2021-09-13: qty 2

## 2021-09-13 MED ORDER — SPIRONOLACTONE 25 MG PO TABS
25.0000 mg | ORAL_TABLET | Freq: Every day | ORAL | Status: DC
  Administered 2021-09-14 – 2021-09-17 (×4): 25 mg via ORAL
  Filled 2021-09-13 (×4): qty 1

## 2021-09-13 MED ORDER — ONDANSETRON HCL 4 MG/2ML IJ SOLN: 4.0000 mg | Freq: Four times a day (QID) | INTRAMUSCULAR | Status: DC | PRN

## 2021-09-13 MED ORDER — ENOXAPARIN SODIUM 60 MG/0.6ML IJ SOSY
60.0000 mg | PREFILLED_SYRINGE | INTRAMUSCULAR | Status: DC
  Administered 2021-09-13: 60 mg via SUBCUTANEOUS
  Filled 2021-09-13 (×2): qty 0.6

## 2021-09-13 MED ORDER — OXYCODONE HCL 5 MG PO TABS
5.0000 mg | ORAL_TABLET | ORAL | Status: DC | PRN
  Administered 2021-09-14 – 2021-09-17 (×8): 5 mg via ORAL
  Filled 2021-09-13 (×9): qty 1

## 2021-09-13 MED ORDER — HYDROCHLOROTHIAZIDE 25 MG PO TABS
25.0000 mg | ORAL_TABLET | Freq: Every day | ORAL | Status: DC
  Administered 2021-09-14 – 2021-09-17 (×4): 25 mg via ORAL
  Filled 2021-09-13 (×4): qty 1

## 2021-09-13 MED ORDER — PREDNISONE 20 MG PO TABS
40.0000 mg | ORAL_TABLET | Freq: Every day | ORAL | Status: DC
  Administered 2021-09-14: 40 mg via ORAL
  Filled 2021-09-13: qty 2

## 2021-09-13 MED ORDER — LOSARTAN POTASSIUM-HCTZ 100-25 MG PO TABS: 1.0000 | ORAL_TABLET | Freq: Every day | ORAL | Status: DC

## 2021-09-13 MED ORDER — METHOCARBAMOL 1000 MG/10ML IJ SOLN: 1000.0000 mg | Freq: Once | INTRAMUSCULAR | Status: DC

## 2021-09-13 MED ORDER — SENNOSIDES-DOCUSATE SODIUM 8.6-50 MG PO TABS: 1.0000 | ORAL_TABLET | Freq: Every evening | ORAL | Status: DC | PRN

## 2021-09-13 MED ORDER — ACETAMINOPHEN 500 MG PO TABS: 1000.0000 mg | ORAL_TABLET | Freq: Four times a day (QID) | ORAL | Status: DC | PRN

## 2021-09-13 MED ORDER — METHOCARBAMOL 1000 MG/10ML IJ SOLN
1000.0000 mg | Freq: Once | INTRAMUSCULAR | Status: AC
  Administered 2021-09-13: 1000 mg via INTRAVENOUS
  Filled 2021-09-13: qty 1000

## 2021-09-13 NOTE — ED Provider Notes (Signed)
[facility] DEPT Provider Note   CSN: [id number] Arrival date & time: 09/13/21  1329     History  Chief Complaint  Patient presents with   Fall   Generalized Body Aches    Rebecca Avila is a 56 y.o. female.  56 year old female presents with worsening pain to her joints.  Symptoms began about 3 days ago.  States she has not had a fever or chills.  Patient was seen at one of the med centers and that visit was reviewed.  She had negative work-up and was given a walker to go home with.  Patient states normally she does not require any assistance with ambulation.  Since that time she now notes pain to her bilateral wrists as well as left hip as well as right knee.  No prior history of the arthritis.  Pain is worse with ambulation.  States that when she is try to stand today she fell due to the discomfort.  Patient has had intermittent chest discomfort was last for several hours.  Not associated with dizziness or diaphoresis.  Of note, patient recently started on hydralazine for hypertension.       Home Medications Prior to Admission medications   Medication Sig Start Date End Date Taking? Authorizing Provider  acetaminophen (TYLENOL) 500 MG tablet 2 tabs by mouth twice daily Patient taking differently: Take 1,000 mg by mouth every 6 (six) hours as needed for moderate pain. 01/25/19   Mack Hook, MD  atorvastatin (LIPITOR) 40 MG tablet Take 1 tablet (40 mg total) by mouth daily. 08/15/21   Early Osmond, MD  Blood Glucose Monitoring Suppl [facility] VERIO) w/Device KIT Check blood sugars twice daily before meals 11/06/19   Mack Hook, MD  Calcium Carb-Cholecalciferol (CALCIUM 500+D3) 500-400 MG-UNIT TABS 1 tab by mouth twice daily 07/03/20   Mack Hook, MD  clopidogrel (PLAVIX) 75 MG tablet Take 1 tablet (75 mg total) by mouth daily. 08/12/21   Early Osmond, MD  cyclobenzaprine (FLEXERIL) 10 MG tablet Take 1 tablet (10 mg total) by  mouth 2 (two) times daily as needed for muscle spasms. 09/11/21   Schutt, Grafton Folk, PA-C  empagliflozin (JARDIANCE) 10 MG TABS tablet Take 1 tablet (10 mg total) by mouth daily before breakfast. 08/12/21   Early Osmond, MD  EPINEPHrine (EPIPEN 2-PAK) 0.3 mg/0.3 mL IJ SOAJ injection Inject 0.3 mLs (0.3 mg total) into the muscle as needed for anaphylaxis. 02/01/19   Mack Hook, MD  glipiZIDE (GLUCOTROL XL) 10 MG 24 hr tablet Take 1 tablet by mouth once daily with breakfast 05/20/21   Mack Hook, MD  glucose blood (ONETOUCH VERIO) test strip Check blood glucose twice daily before meals 04/18/20   Mack Hook, MD  Lancets [facility] ULTRASOFT) lancets Check blood sugar twice daily before meals 04/18/20   Mack Hook, MD  losartan-hydrochlorothiazide [facility]) 100-25 MG tablet Take 1 tablet by mouth once daily 08/08/21   Mack Hook, MD  metFORMIN (GLUCOPHAGE-XR) 500 MG 24 hr tablet TAKE 4 TABLETS BY MOUTH ONCE DAILY WITH BREAKFAST Patient taking differently: 500 mg. TAKE 2 TABLETS BY MOUTH ONCE DAILY 08/06/21   Mack Hook, MD  metoprolol succinate (TOPROL-XL) 100 MG 24 hr tablet TAKE 1 TABLET BY MOUTH ONCE DAILY(TAKE WITH OR IMMEDIATELY FOLLOWING A MEAL) 07/07/21   Mack Hook, MD  nitroGLYCERIN (NITROSTAT) 0.4 MG SL tablet 1 tab under tongue as needed for Chest discomfort and repeat every 5 minutes x 2 if discomfort not relieved 08/06/21  Mack Hook, MD  sertraline (ZOLOFT) 100 MG tablet 1/2 tab by mouth daily until followup 10/18/20   Mack Hook, MD  spironolactone (ALDACTONE) 25 MG tablet Take 1 tablet (25 mg total) by mouth daily. 09/05/21   Early Osmond, MD  tirzepatide [facility]) 2.5 MG/0.5ML Pen Inject 2.5 mg into the skin once a week. 09/05/21   Early Osmond, MD      Allergies    Aspirin, Ibuprofen, Amlodipine, and Ace inhibitors    Review of Systems   Review of Systems  All other systems reviewed and are  negative.   Physical Exam Updated Vital Signs BP (!) 180/102   Pulse 77   Temp 98.2 F (36.8 C) (Oral)   Resp 18   Ht 1.778 m ('5\' 10"' )   Wt (!) 150.1 kg   SpO2 99%   BMI 47.49 kg/m  Physical Exam Vitals and nursing note reviewed.  Constitutional:      General: She is not in acute distress.    Appearance: Normal appearance. She is well-developed. She is not toxic-appearing.  HENT:     Head: Normocephalic and atraumatic.  Eyes:     General: Lids are normal.     Conjunctiva/sclera: Conjunctivae normal.     Pupils: Pupils are equal, round, and reactive to light.  Neck:     Thyroid: No thyroid mass.     Trachea: No tracheal deviation.  Cardiovascular:     Rate and Rhythm: Normal rate and regular rhythm.     Heart sounds: Normal heart sounds. No murmur heard.    No gallop.  Pulmonary:     Effort: Pulmonary effort is normal. No respiratory distress.     Breath sounds: Normal breath sounds. No stridor. No decreased breath sounds, wheezing, rhonchi or rales.  Abdominal:     General: There is no distension.     Palpations: Abdomen is soft.     Tenderness: There is no abdominal tenderness. There is no rebound.  Musculoskeletal:        General: No tenderness. Normal range of motion.     Cervical back: Normal range of motion and neck supple.     Comments: Pain with range of motion of bilateral wrists.  Neurovascular intact at both hands.  Right knee with full range of motion but tenderness with range of motion.  Left hip not shortened or rotated.  Pain with range of motion.  Skin:    General: Skin is warm and dry.     Findings: No abrasion or rash.  Neurological:     Mental Status: She is alert and oriented to person, place, and time. Mental status is at baseline.     GCS: GCS eye subscore is 4. GCS verbal subscore is 5. GCS motor subscore is 6.     Cranial Nerves: No cranial nerve deficit.     Sensory: No sensory deficit.     Motor: Motor function is intact.  Psychiatric:         Attention and Perception: Attention normal.        Speech: Speech normal.        Behavior: Behavior normal.     ED Results / Procedures / Treatments   Labs (all labs ordered are listed, but only abnormal results are displayed) Labs Reviewed  CBC WITH DIFFERENTIAL/PLATELET - Abnormal; Notable for the following components:      Result Value   WBC 11.2 (*)    Hemoglobin 11.8 (*)    Neutro Abs 8.9 (*)  All other components within normal limits  COMPREHENSIVE METABOLIC PANEL - Abnormal; Notable for the following components:   Potassium 3.3 (*)    Glucose, Bld 141 (*)    AST 7 (*)    All other components within normal limits  CK - Abnormal; Notable for the following components:   Total CK 31 (*)    All other components within normal limits  RESP PANEL BY RT-PCR (FLU A&B, COVID) [id number]  LACTIC ACID, PLASMA  LACTIC ACID, PLASMA  URINALYSIS, ROUTINE W REFLEX MICROSCOPIC  BRAIN NATRIURETIC PEPTIDE  SEDIMENTATION RATE  TROPONIN I (HIGH SENSITIVITY)  TROPONIN I (HIGH SENSITIVITY)    EKG EKG Interpretation  Date/Time:  Saturday September 13 2021 14:07:03 EDT Ventricular Rate:  69 PR Interval:  141 QRS Duration: 139 QT Interval:  474 QTC Calculation: 508 R Axis:   35 Text Interpretation: Sinus rhythm Right bundle branch block No significant change since last tracing Confirmed by Lacretia Leigh ([id number]) on 09/13/2021 5:43:40 PM  Radiology DG Chest 2 View  Result Date: 09/13/2021 CLINICAL DATA:  Chest pain EXAM: CHEST - 2 VIEW COMPARISON:  06/15/2017 FINDINGS: Underpenetrated chest radiographs. Mild cardiomegaly. Mild diffuse bilateral interstitial pulmonary opacity. The visualized skeletal structures are unremarkable. IMPRESSION: Mild cardiomegaly with mild diffuse bilateral interstitial pulmonary opacity, likely edema. Appearance may be exaggerated by underpenetrated technique. No focal airspace opacity. Electronically Signed   By: Delanna Ahmadi M.D.   On: 09/13/2021 14:36     Procedures Procedures    Medications Ordered in ED Medications  morphine (PF) 4 MG/ML injection 6 mg (has no administration in time range)  methocarbamol (ROBAXIN) injection 1,000 mg (has no administration in time range)    ED Course/ Medical Decision Making/ A&P                           Medical Decision Making Amount and/or Complexity of Data Reviewed Labs: ordered.  Risk Prescription drug management.   Patient treated with IV pain medication here and continues to be uncomfortable.  Patient recently started on hydralazine concern that she may have drug-induced lupus.  Her sed rate is elevated as well 2.  Patient appears to have a poly arthritis without evidence of septic joint at this time.  And flu are both negative.  Plan will be to admit the patient to the hospital service.  Discussed with Dr. Posey Pronto who admit        Final Clinical Impression(s) / ED Diagnoses Final diagnoses:  None    Rx / DC Orders ED Discharge Orders     None         Lacretia Leigh, MD 09/13/21 2048

## 2021-09-13 NOTE — Assessment & Plan Note (Signed)
Hold home meds and place on SSI. 

## 2021-09-13 NOTE — Assessment & Plan Note (Signed)
Discontinue hydralazine and start spironolactone 25 mg daily as planned by cardiology team.  Continue home losartan-HCTZ 100-25 mg daily and Toprol-XL 100 mg daily.

## 2021-09-13 NOTE — ED Triage Notes (Signed)
Pt arrives via EMS from. Pt reports she has been having generalized bodyaches for the past 3 days. Pt states she is now having pain in her neck and bilateral arms. Pt had a mechanical fall today, denies any injury from the fall.

## 2021-09-13 NOTE — H&P (Signed)
History and Physical    Rebecca Avila KGM:[id number] DOB: Jan 20, 1966 DOA: 09/13/2021  PCP: Mack Hook, MD  Patient coming from: Home  I have personally briefly reviewed patient's old medical records in [place]  Chief Complaint: Polyarticular pain, fall  HPI: Rebecca Avila is a 56 y.o. female with medical history significant for T2DM, HTN, HLD, RBBB, morbid obesity, depression/anxiety who presented to the ED for evaluation of polyarticular pain limiting mobility and resulting in fall.  Patient reports 1 week of progressive polyarticular pain.  Initially began in her left hip then involving her left shoulder.  She is having difficulty with range of motion and ambulation.  She went to the ED on 09/11/2021 at which time left hip and shoulder x-rays were reassuring.  Pain was improved with Flexeril and narcotic pain meds.  She was discharged to home.  Patient states that since then she has developed additional joint pain involving both of her wrists as well as her right knee.  Pain is significantly worse in her wrists limiting flexion and extension.  She has not been able to lift herself off of her bed due to the pain.  She says she did slide out of her bed at home onto the floor this morning but did not injure herself or lose consciousness.  She has seen some mild swelling involving some of these joints.  She denies any subjective fevers, chills, diaphoresis.  She denies any dysuria, dyspnea, chest pain. She denies any similar episodes in the past.  Of note, patient was recently started on hydralazine for blood pressure a little over 1 month ago.  About 1 week ago she was plan to discontinue hydralazine and start spironolactone 25 mg daily.  Patient states that since she has not received her spironolactone she has continued taking hydralazine.  She reports an allergic reaction to NSAIDs resulting in anaphylaxis in the past.  ED Course  Labs/Imaging on admission: I have personally  reviewed following labs and imaging studies.  Initial eval showed BP 179/96, pulse 74, RR 20, temp 98.8 F, SPO2 94% on room air.  Labs show WBC 11.2, hemoglobin 11.8, platelets 300,000, sodium 139, potassium 3.3, bicarb 25, BUN 9, creatinine 0.97, serum glucose 141, lactic acid 1.1, troponin 4x2, BNP 70.5, CK 31, ESR 125.  2 view chest x-ray showed mild cardiomegaly with mild diffuse bilateral interstitial pulmonary opacity.  Patient was given 6 mg IV morphine, 1000 mg IV Robaxin.  Due to fall and mobility deficits related to polyarticular pain the hospitalist service was consulted to admit for further evaluation and management.  Review of Systems: All systems reviewed and are negative except as documented in history of present illness above.   Past Medical History:  Diagnosis Date   Anemia    Anxiety    Depression    Diabetes mellitus without complication ([place])    type 2    Environmental allergies    Hypercalcemia 2014   2020--significant increase in calcium/ionized calcium with elevated PTH--hyperparathyroidism   Hypertension    Sciatica     Past Surgical History:  Procedure Laterality Date   ABDOMINAL HYSTERECTOMY  2007   fibroids--benign etiology   PARATHYROIDECTOMY Left 03/29/2020   Procedure: LEFT INFERIOR PARATHYROIDECTOMY;  Surgeon: Armandina Gemma, MD;  Location: [facility] ORS;  Service: General;  Laterality: Left;  ROOM 1 STARTING AT 12:00PM FOR 90 MIN   PARTIAL HYSTERECTOMY      Social History:  reports that she quit smoking about 17 months ago. Her  smoking use included cigarettes. She started smoking about 29 years ago. She has never used smokeless tobacco. She reports current alcohol use. She reports that she does not use drugs.  Allergies  Allergen Reactions   Aspirin Anaphylaxis   Ibuprofen Anaphylaxis   Amlodipine     LE edema   Ace Inhibitors Cough    Family History  Problem Relation Age of Onset   High blood pressure Father    High blood pressure Paternal  Grandmother    High blood pressure Paternal Aunt    Lung cancer Mother    Cancer Brother        congenital tumor   Diabetes Neg Hx    Thyroid disease Neg Hx      Prior to Admission medications   Medication Sig Start Date End Date Taking? Authorizing Provider  acetaminophen (TYLENOL) 500 MG tablet 2 tabs by mouth twice daily Patient taking differently: Take 1,000 mg by mouth every 6 (six) hours as needed for moderate pain. 01/25/19   Mack Hook, MD  atorvastatin (LIPITOR) 40 MG tablet Take 1 tablet (40 mg total) by mouth daily. 08/15/21   Early Osmond, MD  Blood Glucose Monitoring Suppl [facility] VERIO) w/Device KIT Check blood sugars twice daily before meals 11/06/19   Mack Hook, MD  Calcium Carb-Cholecalciferol (CALCIUM 500+D3) 500-400 MG-UNIT TABS 1 tab by mouth twice daily 07/03/20   Mack Hook, MD  clopidogrel (PLAVIX) 75 MG tablet Take 1 tablet (75 mg total) by mouth daily. 08/12/21   Early Osmond, MD  cyclobenzaprine (FLEXERIL) 10 MG tablet Take 1 tablet (10 mg total) by mouth 2 (two) times daily as needed for muscle spasms. 09/11/21   Schutt, Grafton Folk, PA-C  empagliflozin (JARDIANCE) 10 MG TABS tablet Take 1 tablet (10 mg total) by mouth daily before breakfast. 08/12/21   Early Osmond, MD  EPINEPHrine (EPIPEN 2-PAK) 0.3 mg/0.3 mL IJ SOAJ injection Inject 0.3 mLs (0.3 mg total) into the muscle as needed for anaphylaxis. 02/01/19   Mack Hook, MD  glipiZIDE (GLUCOTROL XL) 10 MG 24 hr tablet Take 1 tablet by mouth once daily with breakfast 05/20/21   Mack Hook, MD  glucose blood (ONETOUCH VERIO) test strip Check blood glucose twice daily before meals 04/18/20   Mack Hook, MD  Lancets [facility] ULTRASOFT) lancets Check blood sugar twice daily before meals 04/18/20   Mack Hook, MD  losartan-hydrochlorothiazide [facility]) 100-25 MG tablet Take 1 tablet by mouth once daily 08/08/21   Mack Hook, MD  metFORMIN  (GLUCOPHAGE-XR) 500 MG 24 hr tablet TAKE 4 TABLETS BY MOUTH ONCE DAILY WITH BREAKFAST Patient taking differently: 500 mg. TAKE 2 TABLETS BY MOUTH ONCE DAILY 08/06/21   Mack Hook, MD  metoprolol succinate (TOPROL-XL) 100 MG 24 hr tablet TAKE 1 TABLET BY MOUTH ONCE DAILY(TAKE WITH OR IMMEDIATELY FOLLOWING A MEAL) 07/07/21   Mack Hook, MD  nitroGLYCERIN (NITROSTAT) 0.4 MG SL tablet 1 tab under tongue as needed for Chest discomfort and repeat every 5 minutes x 2 if discomfort not relieved 08/06/21   Mack Hook, MD  sertraline (ZOLOFT) 100 MG tablet 1/2 tab by mouth daily until followup 10/18/20   Mack Hook, MD  spironolactone (ALDACTONE) 25 MG tablet Take 1 tablet (25 mg total) by mouth daily. 09/05/21   Early Osmond, MD  tirzepatide [facility]) 2.5 MG/0.5ML Pen Inject 2.5 mg into the skin once a week. 09/05/21   Early Osmond, MD    Physical Exam: Vitals:   09/13/21 1930 09/13/21  2030 09/13/21 2100 09/13/21 2130  BP: (!) 170/104 (!) 171/101 139/78 (!) 162/72  Pulse: 81 85 70 75  Resp: (!) 23 (!) '21 20 17  ' Temp:      TempSrc:      SpO2: 92% 92% 91% 92%  Weight:      Height:       Constitutional: Morbidly obese woman resting in bed, NAD, calm, comfortable Eyes: EOMI, lids and conjunctivae normal ENMT: Mucous membranes are moist. Posterior pharynx clear of any exudate or lesions.Normal dentition.  Neck: normal, supple, no masses. Respiratory: clear to auscultation bilaterally, no wheezing, no crackles. Normal respiratory effort. Cardiovascular: Regular rate and rhythm, no murmurs / rubs / gallops. No extremity edema. 2+ pedal pulses. Abdomen: no tenderness, no masses palpated.  Musculoskeletal: Significant pain and diminished ROM most significant in bilateral wrists but also involving left hip and right knee.  No significant swelling/effusions noted. Skin: no rashes, lesions, ulcers. No induration Neurologic: Sensation intact. Strength largely 5/5  however limited in bilateral wrists, left hip, right knee due to pain from polyarthralgia. Psychiatric: Alert and oriented x 3. Normal mood.   EKG: Personally reviewed. Sinus rhythm, rate 69, RBBB, QTc 508.  Similar to prior.  Assessment/Plan Principal Problem:   Polyarthralgia Active Problems:   Hypertension associated with diabetes ([place])   Type 2 diabetes mellitus ([place])   Hyperlipidemia associated with type 2 diabetes mellitus ([place])   Rebecca Avila is a 56 y.o. female with medical history significant for T2DM, HTN, HLD, RBBB, morbid obesity, depression/anxiety who is admitted with polyarthralgia with uncontrolled pain and mobility deficits.  Assessment and Plan: * Polyarthralgia One week of progressive polyarthralgia affecting left hip, left shoulder, right knee, and more significantly bilateral wrists.  Having uncontrolled pain, range of motion, impaired mobility.  No sign of septic arthritis or significant joint effusion.  ESR significantly elevated at 125.  Question of drug-induced lupus with hydralazine use.  Patient reports anaphylactic reaction to NSAIDs. -Discontinue hydralazine as planned -Check CRP, ANCA, ANA with reflex, uric acid, RPR, GC/chlamydia -Continue analgesics as needed -Request PT/OT eval -Will trial prednisone 40 mg daily  Hypertension associated with diabetes ([place]) Discontinue hydralazine and start spironolactone 25 mg daily as planned by cardiology team.  Continue home losartan-HCTZ 100-25 mg daily and Toprol-XL 100 mg daily.  Type 2 diabetes mellitus ([place]) Hold home meds and place on SSI.  Hyperlipidemia associated with type 2 diabetes mellitus ([place]) Presentation more consistent with polyarthralgia rather than myalgias/myopathy related to atorvastatin.  CK only minimally elevated at 31.  Will hold statin for now but can likely resume on discharge pending clinical progress.  DVT prophylaxis:  Code Status: Full code, confirmed with patient on  admission Family Communication: Discussed with patient, she has discussed with family Disposition Plan: From home and likely discharge to home pending clinical progress Consults called: None Severity of Illness: The appropriate patient status for this patient is OBSERVATION. Observation status is judged to be reasonable and necessary in order to provide the required intensity of service to ensure the patient's safety. The patient's presenting symptoms, physical exam findings, and initial radiographic and laboratory data in the context of their medical condition is felt to place them at decreased risk for further clinical deterioration. Furthermore, it is anticipated that the patient will be medically stable for discharge from the hospital within 2 midnights of admission.   Zada Finders MD [facility]  If 7PM-7AM, please contact night-coverage [email or web address]  09/13/2021, 10:01 PM

## 2021-09-13 NOTE — Hospital Course (Signed)
Rebecca Avila is a 56 y.o. female with medical history significant for T2DM, HTN, HLD, RBBB, morbid obesity, depression/anxiety who is admitted with polyarthralgia with uncontrolled pain and mobility deficits.

## 2021-09-13 NOTE — Assessment & Plan Note (Signed)
One week of progressive polyarthralgia affecting left hip, left shoulder, right knee, and more significantly bilateral wrists.  Having uncontrolled pain, range of motion, impaired mobility.  No sign of septic arthritis or significant joint effusion.  ESR significantly elevated at 125.  Question of drug-induced lupus with hydralazine use.  Patient reports anaphylactic reaction to NSAIDs. -Discontinue hydralazine as planned -Check CRP, ANCA, ANA with reflex, uric acid, RPR, GC/chlamydia -Continue analgesics as needed -Request PT/OT eval -Will trial prednisone 40 mg daily

## 2021-09-13 NOTE — Assessment & Plan Note (Signed)
Presentation more consistent with polyarthralgia rather than myalgias/myopathy related to atorvastatin.  CK only minimally elevated at 31.  Will hold statin for now but can likely resume on discharge pending clinical progress.

## 2021-09-13 NOTE — ED Provider Triage Note (Signed)
Emergency Medicine Provider Triage Evaluation Note  Rebecca Avila , a 56 y.o. female  was evaluated in triage.  Pt complains of pain all over her body for about 2 days.  She says she aches in her right knee, in her chest, and her neck, headache.  She says that she has had chills.  [facility] with left hip and left shoulder pain and discharged home.  She had a negative hip x-ray and shoulder x-ray.  No labs were drawn at that time.  Review of Systems  Positive:  Negative:   Physical Exam  BP (!) 179/96 (BP Location: Right Arm)   Pulse 74   Temp 98.8 F (37.1 C) (Oral)   Resp 20   Ht 5\' 10"  (1.778 m)   Wt (!) 150.1 kg   SpO2 94%   BMI 47.49 kg/m  Gen:   Awake, no distress   Resp:  Normal effort  MSK:   Moves extremities without difficulty  Other:  Diffusely tender in all areas of body.   Medical Decision Making  Medically screening exam initiated at 1:55 PM.  Appropriate orders placed.  Rebecca Avila was informed that the remainder of the evaluation will be completed by another provider, this initial triage assessment does not replace that evaluation, and the importance of remaining in the ED until their evaluation is complete.     Rosilyn Mings, PA-C 09/13/21 1359

## 2021-09-14 DIAGNOSIS — E785 Hyperlipidemia, unspecified: Secondary | ICD-10-CM | POA: Diagnosis present

## 2021-09-14 DIAGNOSIS — I4510 Unspecified right bundle-branch block: Secondary | ICD-10-CM | POA: Diagnosis present

## 2021-09-14 DIAGNOSIS — Z888 Allergy status to other drugs, medicaments and biological substances status: Secondary | ICD-10-CM | POA: Diagnosis not present

## 2021-09-14 DIAGNOSIS — Z7985 Long-term (current) use of injectable non-insulin antidiabetic drugs: Secondary | ICD-10-CM | POA: Diagnosis not present

## 2021-09-14 DIAGNOSIS — R262 Difficulty in walking, not elsewhere classified: Secondary | ICD-10-CM | POA: Diagnosis present

## 2021-09-14 DIAGNOSIS — Z87891 Personal history of nicotine dependence: Secondary | ICD-10-CM | POA: Diagnosis not present

## 2021-09-14 DIAGNOSIS — Z886 Allergy status to analgesic agent status: Secondary | ICD-10-CM | POA: Diagnosis not present

## 2021-09-14 DIAGNOSIS — M2559 Pain in other specified joint: Secondary | ICD-10-CM | POA: Diagnosis present

## 2021-09-14 DIAGNOSIS — Z7902 Long term (current) use of antithrombotics/antiplatelets: Secondary | ICD-10-CM | POA: Diagnosis not present

## 2021-09-14 DIAGNOSIS — Z6841 Body Mass Index (BMI) 40.0 and over, adult: Secondary | ICD-10-CM | POA: Diagnosis not present

## 2021-09-14 DIAGNOSIS — E1165 Type 2 diabetes mellitus with hyperglycemia: Secondary | ICD-10-CM | POA: Diagnosis present

## 2021-09-14 DIAGNOSIS — M069 Rheumatoid arthritis, unspecified: Secondary | ICD-10-CM | POA: Diagnosis present

## 2021-09-14 DIAGNOSIS — M2550 Pain in unspecified joint: Secondary | ICD-10-CM | POA: Diagnosis not present

## 2021-09-14 DIAGNOSIS — I152 Hypertension secondary to endocrine disorders: Secondary | ICD-10-CM | POA: Diagnosis present

## 2021-09-14 DIAGNOSIS — R7982 Elevated C-reactive protein (CRP): Secondary | ICD-10-CM | POA: Diagnosis present

## 2021-09-14 DIAGNOSIS — E892 Postprocedural hypoparathyroidism: Secondary | ICD-10-CM | POA: Diagnosis present

## 2021-09-14 DIAGNOSIS — Z20822 Contact with and (suspected) exposure to covid-19: Secondary | ICD-10-CM | POA: Diagnosis present

## 2021-09-14 DIAGNOSIS — F410 Panic disorder [episodic paroxysmal anxiety] without agoraphobia: Secondary | ICD-10-CM | POA: Diagnosis present

## 2021-09-14 DIAGNOSIS — Z7984 Long term (current) use of oral hypoglycemic drugs: Secondary | ICD-10-CM | POA: Diagnosis not present

## 2021-09-14 DIAGNOSIS — R600 Localized edema: Secondary | ICD-10-CM | POA: Diagnosis present

## 2021-09-14 DIAGNOSIS — Z90710 Acquired absence of both cervix and uterus: Secondary | ICD-10-CM | POA: Diagnosis not present

## 2021-09-14 DIAGNOSIS — E1169 Type 2 diabetes mellitus with other specified complication: Secondary | ICD-10-CM | POA: Diagnosis present

## 2021-09-14 DIAGNOSIS — E876 Hypokalemia: Secondary | ICD-10-CM | POA: Diagnosis present

## 2021-09-14 DIAGNOSIS — M5430 Sciatica, unspecified side: Secondary | ICD-10-CM | POA: Diagnosis present

## 2021-09-14 DIAGNOSIS — F32A Depression, unspecified: Secondary | ICD-10-CM | POA: Diagnosis present

## 2021-09-14 LAB — BASIC METABOLIC PANEL
Anion gap: 11 (ref 5–15)
BUN: 12 mg/dL (ref 6–20)
CO2: 24 mmol/L (ref 22–32)
Calcium: 8.5 mg/dL — ABNORMAL LOW (ref 8.9–10.3)
Chloride: 103 mmol/L (ref 98–111)
Creatinine, Ser: 1.09 mg/dL — ABNORMAL HIGH (ref 0.44–1.00)
GFR, Estimated: >60 mL/min (ref >60)
Glucose, Bld: 166 mg/dL — ABNORMAL HIGH (ref 70–99)
Potassium: 3.4 mmol/L — ABNORMAL LOW (ref 3.5–5.1)
Sodium: 138 mmol/L (ref 135–145)

## 2021-09-14 LAB — CBC
HCT: 35.8 % — ABNORMAL LOW (ref 36.0–46.0)
Hemoglobin: 11.3 g/dL — ABNORMAL LOW (ref 12.0–15.0)
MCH: 26.1 pg (ref 26.0–34.0)
MCHC: 31.6 g/dL (ref 30.0–36.0)
MCV: 82.7 fL (ref 80.0–100.0)
Platelets: 262 10*3/uL (ref 150–400)
RBC: 4.33 MIL/uL (ref 3.87–5.11)
RDW: 14.2 % (ref 11.5–15.5)
WBC: 14.1 10*3/uL — ABNORMAL HIGH (ref 4.0–10.5)
nRBC: 0 % (ref 0.0–0.2)

## 2021-09-14 LAB — GLUCOSE, CAPILLARY
Glucose-Capillary: 135 mg/dL — ABNORMAL HIGH (ref 70–99)
Glucose-Capillary: 208 mg/dL — ABNORMAL HIGH (ref 70–99)
Glucose-Capillary: 241 mg/dL — ABNORMAL HIGH (ref 70–99)
Glucose-Capillary: 344 mg/dL — ABNORMAL HIGH (ref 70–99)

## 2021-09-14 LAB — HEPATITIS PANEL, ACUTE
HCV Ab: NONREACTIVE
Hep A IgM: NONREACTIVE
Hep B C IgM: NONREACTIVE
Hepatitis B Surface Ag: NONREACTIVE

## 2021-09-14 LAB — C-REACTIVE PROTEIN: CRP: 33.2 mg/dL — ABNORMAL HIGH (ref <1.0)

## 2021-09-14 LAB — RPR: RPR Ser Ql: NONREACTIVE

## 2021-09-14 LAB — HIV ANTIBODY (ROUTINE TESTING W REFLEX): HIV Screen 4th Generation wRfx: NONREACTIVE

## 2021-09-14 MED ORDER — AMLODIPINE BESYLATE 5 MG PO TABS
5.0000 mg | ORAL_TABLET | Freq: Every day | ORAL | Status: DC
  Administered 2021-09-14 – 2021-09-15 (×2): 5 mg via ORAL
  Filled 2021-09-14 (×2): qty 1

## 2021-09-14 MED ORDER — INSULIN GLARGINE-YFGN 100 UNIT/ML ~~LOC~~ SOLN
15.0000 [IU] | Freq: Every day | SUBCUTANEOUS | Status: DC
  Administered 2021-09-14: 15 [IU] via SUBCUTANEOUS
  Filled 2021-09-14 (×2): qty 0.15

## 2021-09-14 MED ORDER — ENOXAPARIN SODIUM 80 MG/0.8ML IJ SOSY
75.0000 mg | PREFILLED_SYRINGE | INTRAMUSCULAR | Status: DC
  Administered 2021-09-14 – 2021-09-16 (×3): 75 mg via SUBCUTANEOUS
  Filled 2021-09-14 (×3): qty 0.8

## 2021-09-14 MED ORDER — POTASSIUM CHLORIDE CRYS ER 20 MEQ PO TBCR
40.0000 meq | EXTENDED_RELEASE_TABLET | Freq: Once | ORAL | Status: AC
  Administered 2021-09-14: 40 meq via ORAL
  Filled 2021-09-14: qty 2

## 2021-09-14 MED ORDER — METHYLPREDNISOLONE SODIUM SUCC 40 MG IJ SOLR
40.0000 mg | Freq: Two times a day (BID) | INTRAMUSCULAR | Status: DC
  Administered 2021-09-14 – 2021-09-17 (×7): 40 mg via INTRAVENOUS
  Filled 2021-09-14 (×7): qty 1

## 2021-09-14 MED ORDER — METOPROLOL TARTRATE 5 MG/5ML IV SOLN
5.0000 mg | INTRAVENOUS | Status: DC | PRN
  Administered 2021-09-14 (×2): 5 mg via INTRAVENOUS
  Filled 2021-09-14 (×2): qty 5

## 2021-09-14 MED ORDER — LABETALOL HCL 5 MG/ML IV SOLN
10.0000 mg | INTRAVENOUS | Status: DC | PRN
  Administered 2021-09-14 – 2021-09-16 (×4): 10 mg via INTRAVENOUS
  Filled 2021-09-14 (×4): qty 4

## 2021-09-14 NOTE — Progress Notes (Signed)
   09/14/21 1024  Assess: MEWS Score  BP (!) 209/95  Pulse Rate 80  Resp 14  Level of Consciousness Alert  SpO2 100 %  Assess: MEWS Score  MEWS Temp 0  MEWS Systolic 2  MEWS Pulse 0  MEWS RR 0  MEWS LOC 0  MEWS Score 2  MEWS Score Color Yellow  Assess: if the MEWS score is Yellow or Red  Were vital signs taken at a resting state? Yes  Focused Assessment No change from prior assessment  Does the patient meet 2 or more of the SIRS criteria? No  Treat  MEWS Interventions Administered prn meds/treatments;Escalated (See documentation below)  Pain Scale 0-10  Pain Score 0  Take Vital Signs  Increase Vital Sign Frequency  Yellow: Q 2hr X 2 then Q 4hr X 2, if remains yellow, continue Q 4hrs  Escalate  MEWS: Escalate Yellow: discuss with charge nurse/RN and consider discussing with provider and RRT  Notify: Charge Nurse/RN  Name of Charge Nurse/RN Notified myslf/included RN jennifer  Date Charge Nurse/RN Notified 09/14/21  Time Charge Nurse/RN Notified 1029  Notify: Provider  Provider Name/Title Pahwani  Date Provider Notified 09/14/21  Time Provider Notified 1030  Method of Notification Page  Notification Reason Other (Comment) (Yellow Mews for BP, physician already aware, been high. Started new/changed prn medication)  Provider response Other (Comment) (already administered PRN med, told physician i will notify if no change when we recheck)  Document  Progress note created (see row info) Yes  Assess: SIRS CRITERIA  SIRS Temperature  0  SIRS Pulse 0  SIRS Respirations  0  SIRS WBC 1  SIRS Score Sum  1

## 2021-09-14 NOTE — Progress Notes (Signed)
PT Cancellation Note  Patient Details Name: Rebecca Avila MRN: [id number] DOB: [birth year]   Cancelled Treatment:     PT order received and eval attempted am and pm but deferred by RN 2* elevated BP.  Will follow.   Massie Cogliano 09/14/2021, 4:41 PM

## 2021-09-14 NOTE — Plan of Care (Signed)
  Problem: Education: Goal: Knowledge of General Education information will improve Description: Including pain rating scale, medication(s)/side effects and non-pharmacologic comfort measures Outcome: Progressing   Problem: Activity: Goal: Risk for activity intolerance will decrease Outcome: Progressing   Problem: Pain Managment: Goal: General experience of comfort will improve Outcome: Progressing   

## 2021-09-14 NOTE — Progress Notes (Signed)
PROGRESS NOTE    Rebecca Avila  SKA:[id number] DOB: [birth year] DOA: 09/13/2021 PCP: Mack Hook, MD   Brief Narrative:  HPI: Rebecca Avila is a 56 y.o. female with medical history significant for T2DM, HTN, HLD, RBBB, morbid obesity, depression/anxiety who presented to the ED for evaluation of polyarticular pain limiting mobility and resulting in fall.   Patient reports 1 week of progressive polyarticular pain.  Initially began in her left hip then involving her left shoulder.  She is having difficulty with range of motion and ambulation.  She went to the ED on 09/11/2021 at which time left hip and shoulder x-rays were reassuring.  Pain was improved with Flexeril and narcotic pain meds.  She was discharged to home.   Patient states that since then she has developed additional joint pain involving both of her wrists as well as her right knee.  Pain is significantly worse in her wrists limiting flexion and extension.  She has not been able to lift herself off of her bed due to the pain.  She says she did slide out of her bed at home onto the floor this morning but did not injure herself or lose consciousness.   She has seen some mild swelling involving some of these joints.  She denies any subjective fevers, chills, diaphoresis.  She denies any dysuria, dyspnea, chest pain. She denies any similar episodes in the past.   Of note, patient was recently started on hydralazine for blood pressure a little over 1 month ago.  About 1 week ago she was plan to discontinue hydralazine and start spironolactone 25 mg daily.  Patient states that since she has not received her spironolactone she has continued taking hydralazine.   She reports an allergic reaction to NSAIDs resulting in anaphylaxis in the past.   ED Course  Labs/Imaging on admission: I have personally reviewed following labs and imaging studies.   Initial eval showed BP 179/96, pulse 74, RR 20, temp 98.8 F, SPO2 94% on room air.    Labs show WBC 11.2, hemoglobin 11.8, platelets 300,000, sodium 139, potassium 3.3, bicarb 25, BUN 9, creatinine 0.97, serum glucose 141, lactic acid 1.1, troponin 4x2, BNP 70.5, CK 31, ESR 125.   2 view chest x-ray showed mild cardiomegaly with mild diffuse bilateral interstitial pulmonary opacity.   Patient was given 6 mg IV morphine, 1000 mg IV Robaxin.  Due to fall and mobility deficits related to polyarticular pain the hospitalist service was consulted to admit for further evaluation and management.    Assessment & Plan:   Principal Problem:   Polyarthralgia Active Problems:   Hypertension associated with diabetes ([place])   Type 2 diabetes mellitus ([place])   Hyperlipidemia associated with type 2 diabetes mellitus ([place])  Polyarthralgia One week of progressive polyarthralgia affecting left hip, left shoulder, right knee, and more significantly bilateral wrists and now bilateral hands.  Having uncontrolled pain, range of motion, impaired mobility.  No sign of septic arthritis or significant joint effusion.  ESR and CRP elevated.  Question of drug-induced lupus with hydralazine use.  Patient reports anaphylactic reaction to NSAIDs.  Hydralazine discontinued.  Admitting hospitalist ordered ANCA, ANA with reflex, uric acid, RPR, GC/chlamydia, I will further expand and check antihistone antibodies, rheumatoid factor, HPV, EBV, acute viral hepatitis panel.  We will transition from p.o. prednisone to IV Solu-Medrol 40 mg twice daily.  PT OT consulted.  Continue as needed pain medications.   Hypertension associated with diabetes ([place]) Discontinued hydralazine and started spironolactone  25 mg daily as planned by cardiology team.  Continue home losartan-HCTZ 100-25 mg daily and Toprol-XL 100 mg daily.  Blood pressure still elevated.  I understand her amlodipine was stopped only because of lower extremity edema however currently we are running out of the options so I will resume it back at 5 mg p.o. daily  and I have also added as needed labetalol.   Type 2 diabetes mellitus ([place]): Last hemoglobin A1c in July 2023 was 10.3.  She takes metformin and Jardiance as well as glipizide at home.  Holding all of those.  She is hyperglycemic.  Now that I am increasing her Solu-Medrol, she will be further hyperglycemic.  She is on SSI.  I will add Lantus 15 units daily.   Hyperlipidemia associated with type 2 diabetes mellitus ([place]) Presentation more consistent with polyarthralgia rather than myalgias/myopathy related to atorvastatin.  CK only minimally elevated at 31.  Will hold statin for now but can likely resume on discharge pending clinical progress.  Hypokalemia: Replace.  DVT prophylaxis:    Code Status: Full Code  Family Communication: Husband present at bedside.  Plan of care discussed with patient in length and he/she verbalized understanding and agreed with it.  Status is: Observation The patient will require care spanning > 2 midnights and should be moved to inpatient because: Patient with significant pain, needs pain medications, IV steroids and complete work-up for possible cause/etiology of polyarthralgia.   Estimated body mass index is 47.49 kg/m as calculated from the following:   Height as of this encounter: '5\' 10"'  (1.778 m).   Weight as of this encounter: 150.1 kg.    Nutritional Assessment: Body mass index is 47.49 kg/m.Marland Kitchen Seen by dietician.  I agree with the assessment and plan as outlined below: Nutrition Status:        . Skin Assessment: I have examined the patient's skin and I agree with the wound assessment as performed by the wound care RN as outlined below:    Consultants:  None  Procedures:  None  Antimicrobials:  Anti-infectives (From admission, onward)    None         Subjective: Patient seen and examined.  She complains of severe pain in bilateral hands and upper extremities to the point that she finds it very hard to lift her upper extremities  and flex her fingers.  Husband at the bedside.  I answered several questions he had.  Objective: Vitals:   09/13/21 2231 09/14/21 0137 09/14/21 0544 09/14/21 1024  BP: (!) 172/93 (!) 198/96 (!) 199/94 (!) 209/95  Pulse: 80 82 84 80  Resp: '20 17 17 14  ' Temp: 99.4 F (37.4 C) 98.6 F (37 C) 98.2 F (36.8 C)   TempSrc: Oral     SpO2: 96% 97% 100% 100%  Weight:      Height:        Intake/Output Summary (Last 24 hours) at 09/14/2021 1236 Last data filed at 09/14/2021 0826 Gross per 24 hour  Intake 470 ml  Output 225 ml  Net 245 ml   Filed Weights   09/13/21 1347  Weight: (!) 150.1 kg    Examination:  General exam: Appears calm and comfortable, obese Respiratory system: Clear to auscultation. Respiratory effort normal. Cardiovascular system: S1 & S2 heard, RRR. No JVD, murmurs, rubs, gallops or clicks. No pedal edema. Gastrointestinal system: Abdomen is nondistended, soft and nontender. No organomegaly or masses felt. Normal bowel sounds heard. Central nervous system: Alert and oriented. No focal neurological  deficits. Extremities: Bilateral wrist tenderness. Psychiatry: Judgement and insight appear normal. Mood & affect appropriate.    Data Reviewed: I have personally reviewed following labs and imaging studies  CBC: Recent Labs  Lab 09/13/21 1416 09/14/21 0254  WBC 11.2* 14.1*  NEUTROABS 8.9*  --   HGB 11.8* 11.3*  HCT 37.1 35.8*  MCV 82.4 82.7  PLT 300 203   Basic Metabolic Panel: Recent Labs  Lab 09/13/21 1416 09/14/21 0254  NA 139 138  K 3.3* 3.4*  CL 102 103  CO2 25 24  GLUCOSE 141* 166*  BUN 9 12  CREATININE 0.97 1.09*  CALCIUM 8.9 8.5*   GFR: Estimated Creatinine Clearance: 94.2 mL/min (A) (by C-G formula based on SCr of 1.09 mg/dL (H)). Liver Function Tests: Recent Labs  Lab 09/13/21 1416  AST 7*  ALT 9  ALKPHOS 59  BILITOT 0.5  PROT 7.8  ALBUMIN 3.5   No results for input(s): "LIPASE", "AMYLASE" in the last 168 hours. No results for  input(s): "AMMONIA" in the last 168 hours. Coagulation Profile: No results for input(s): "INR", "PROTIME" in the last 168 hours. Cardiac Enzymes: Recent Labs  Lab 09/13/21 1416  CKTOTAL 31*   BNP (last 3 results) No results for input(s): "PROBNP" in the last 8760 hours. HbA1C: No results for input(s): "HGBA1C" in the last 72 hours. CBG: Recent Labs  Lab 09/13/21 2251 09/14/21 0727 09/14/21 1154  GLUCAP 142* 135* 208*   Lipid Profile: No results for input(s): "CHOL", "HDL", "LDLCALC", "TRIG", "CHOLHDL", "LDLDIRECT" in the last 72 hours. Thyroid Function Tests: No results for input(s): "TSH", "T4TOTAL", "FREET4", "T3FREE", "THYROIDAB" in the last 72 hours. Anemia Panel: No results for input(s): "VITAMINB12", "FOLATE", "FERRITIN", "TIBC", "IRON", "RETICCTPCT" in the last 72 hours. Sepsis Labs: Recent Labs  Lab 09/13/21 1416  LATICACIDVEN 1.1    Recent Results (from the past 240 hour(s))  Resp Panel by RT-PCR (Flu A&B, Covid) Anterior Nasal Swab     Status: None   Collection Time: 09/13/21  2:16 PM   Specimen: Anterior Nasal Swab  Result Value Ref Range Status   SARS Coronavirus 2 by RT PCR NEGATIVE NEGATIVE Final    Comment: (NOTE) SARS-CoV-2 target nucleic acids are NOT DETECTED.  The SARS-CoV-2 RNA is generally detectable in upper respiratory specimens during the acute phase of infection. The lowest concentration of SARS-CoV-2 viral copies this assay can detect is 138 copies/mL. A negative result does not preclude SARS-Cov-2 infection and should not be used as the sole basis for treatment or other patient management decisions. A negative result may occur with  improper specimen collection/handling, submission of specimen other than nasopharyngeal swab, presence of viral mutation(s) within the areas targeted by this assay, and inadequate number of viral copies(<138 copies/mL). A negative result must be combined with clinical observations, patient history, and  epidemiological information. The expected result is Negative.  Fact Sheet for Patients:  EntrepreneurPulse.com.au  Fact Sheet for Healthcare Providers:  IncredibleEmployment.be  This test is no t yet approved or cleared by the [place] FDA and  has been authorized for detection and/or diagnosis of SARS-CoV-2 by FDA under an Emergency Use Authorization (EUA). This EUA will remain  in effect (meaning this test can be used) for the duration of the COVID-19 declaration under Section 564(b)(1) of the Act, 21 U.S.C.section 360bbb-3(b)(1), unless the authorization is terminated  or revoked sooner.       Influenza A by PCR NEGATIVE NEGATIVE Final   Influenza B by PCR NEGATIVE NEGATIVE Final  Comment: (NOTE) The Xpert Xpress SARS-CoV-2/FLU/RSV plus assay is intended as an aid in the diagnosis of influenza from Nasopharyngeal swab specimens and should not be used as a sole basis for treatment. Nasal washings and aspirates are unacceptable for Xpert Xpress SARS-CoV-2/FLU/RSV testing.  Fact Sheet for Patients: EntrepreneurPulse.com.au  Fact Sheet for Healthcare Providers: IncredibleEmployment.be  This test is not yet approved or cleared by the [place] FDA and has been authorized for detection and/or diagnosis of SARS-CoV-2 by FDA under an Emergency Use Authorization (EUA). This EUA will remain in effect (meaning this test can be used) for the duration of the COVID-19 declaration under Section 564(b)(1) of the Act, 21 U.S.C. section 360bbb-3(b)(1), unless the authorization is terminated or revoked.  Performed at [facility], [place] [address], [place], [place] [address]      Radiology Studies: DG Chest 2 View  Result Date: 09/13/2021 CLINICAL DATA:  Chest pain EXAM: CHEST - 2 VIEW COMPARISON:  06/15/2017 FINDINGS: Underpenetrated chest radiographs. Mild cardiomegaly. Mild diffuse  bilateral interstitial pulmonary opacity. The visualized skeletal structures are unremarkable. IMPRESSION: Mild cardiomegaly with mild diffuse bilateral interstitial pulmonary opacity, likely edema. Appearance may be exaggerated by underpenetrated technique. No focal airspace opacity. Electronically Signed   By: Delanna Ahmadi M.D.   On: 09/13/2021 14:36    Scheduled Meds:  enoxaparin (LOVENOX) injection  75 mg Subcutaneous Q24H   losartan  100 mg Oral Daily   And   hydrochlorothiazide  25 mg Oral Daily   insulin aspart  0-15 Units Subcutaneous TID WC   insulin aspart  0-5 Units Subcutaneous QHS   metoprolol succinate  100 mg Oral Daily   predniSONE  40 mg Oral Q breakfast   spironolactone  25 mg Oral Daily   Continuous Infusions:   LOS: 0 days   Darliss Cheney, MD [facility]  09/14/2021, 12:36 PM   *Please note that this is a verbal dictation therefore any spelling or grammatical errors are due to the "[employer] One" system interpretation.  Please page via [place] and do not message via secure chat for urgent patient care matters. Secure chat can be used for non urgent patient care matters.  How to contact the [facility] Attending or Consulting provider [place] or covering provider during after hours [place], for this patient?  Check the care team in [facility] and look for a) attending/consulting [facility] provider listed and b) the [facility] team listed. Page or secure chat [phone]. Log into [email or web address] and use [place]'s universal password to access. If you do not have the password, please contact the hospital operator. Locate the [facility] provider you are looking for under [facility] and page to a number that you can be directly reached. If you still have difficulty reaching the provider, please page the [facility] (Director on Call) for the Hospitalists listed on amion for assistance.

## 2021-09-14 NOTE — Progress Notes (Signed)
OT Cancellation Note  Patient Details Name: DENICIA PAGLIARULO MRN: [id number] DOB: 23-Feb-1965   Cancelled Treatment:    Reason Eval/Treat Not Completed: Medical issues which prohibited therapy Nursing asking for therapy to hold off at this time with increased BP and pain. OT to continue to follow and check back as schedule will allow.  Sharyn Blitz OTR/L, MS Acute Rehabilitation Department Office# [phone] Pager# [phone]  09/14/2021, 3:02 PM

## 2021-09-15 DIAGNOSIS — M2550 Pain in unspecified joint: Secondary | ICD-10-CM | POA: Diagnosis not present

## 2021-09-15 LAB — CBC WITH DIFFERENTIAL/PLATELET
Abs Immature Granulocytes: 0.12 10*3/uL — ABNORMAL HIGH (ref 0.00–0.07)
Basophils Absolute: 0 10*3/uL (ref 0.0–0.1)
Basophils Relative: 0 %
Eosinophils Absolute: 0 10*3/uL (ref 0.0–0.5)
Eosinophils Relative: 0 %
HCT: 36.7 % (ref 36.0–46.0)
Hemoglobin: 11.7 g/dL — ABNORMAL LOW (ref 12.0–15.0)
Immature Granulocytes: 1 %
Lymphocytes Relative: 5 %
Lymphs Abs: 0.8 10*3/uL (ref 0.7–4.0)
MCH: 26.1 pg (ref 26.0–34.0)
MCHC: 31.9 g/dL (ref 30.0–36.0)
MCV: 81.9 fL (ref 80.0–100.0)
Monocytes Absolute: 0.5 10*3/uL (ref 0.1–1.0)
Monocytes Relative: 3 %
Neutro Abs: 13.4 10*3/uL — ABNORMAL HIGH (ref 1.7–7.7)
Neutrophils Relative %: 91 %
Platelets: 368 10*3/uL (ref 150–400)
RBC: 4.48 MIL/uL (ref 3.87–5.11)
RDW: 14.2 % (ref 11.5–15.5)
WBC: 14.7 10*3/uL — ABNORMAL HIGH (ref 4.0–10.5)
nRBC: 0 % (ref 0.0–0.2)

## 2021-09-15 LAB — COMPREHENSIVE METABOLIC PANEL
ALT: 9 U/L (ref 0–44)
AST: 9 U/L — ABNORMAL LOW (ref 15–41)
Albumin: 3.1 g/dL — ABNORMAL LOW (ref 3.5–5.0)
Alkaline Phosphatase: 68 U/L (ref 38–126)
Anion gap: 13 (ref 5–15)
BUN: 23 mg/dL — ABNORMAL HIGH (ref 6–20)
CO2: 23 mmol/L (ref 22–32)
Calcium: 8.7 mg/dL — ABNORMAL LOW (ref 8.9–10.3)
Chloride: 100 mmol/L (ref 98–111)
Creatinine, Ser: 1.24 mg/dL — ABNORMAL HIGH (ref 0.44–1.00)
GFR, Estimated: 52 mL/min — ABNORMAL LOW (ref >60)
Glucose, Bld: 382 mg/dL — ABNORMAL HIGH (ref 70–99)
Potassium: 3.6 mmol/L (ref 3.5–5.1)
Sodium: 136 mmol/L (ref 135–145)
Total Bilirubin: 0.4 mg/dL (ref 0.3–1.2)
Total Protein: 8.2 g/dL — ABNORMAL HIGH (ref 6.5–8.1)

## 2021-09-15 LAB — GC/CHLAMYDIA PROBE AMP (~~LOC~~) NOT AT ARMC
Chlamydia: NEGATIVE
Comment: NEGATIVE
Comment: NORMAL
Neisseria Gonorrhea: NEGATIVE

## 2021-09-15 LAB — GLUCOSE, CAPILLARY
Glucose-Capillary: 359 mg/dL — ABNORMAL HIGH (ref 70–99)
Glucose-Capillary: 344 mg/dL — ABNORMAL HIGH (ref 70–99)
Glucose-Capillary: 268 mg/dL — ABNORMAL HIGH (ref 70–99)
Glucose-Capillary: 270 mg/dL — ABNORMAL HIGH (ref 70–99)

## 2021-09-15 LAB — ANA W/REFLEX IF POSITIVE: Anti Nuclear Antibody (ANA): NEGATIVE

## 2021-09-15 LAB — RHEUMATOID FACTOR: Rheumatoid fact SerPl-aCnc: 18.2 IU/mL — ABNORMAL HIGH (ref <14.0)

## 2021-09-15 LAB — RPR: RPR Ser Ql: NONREACTIVE

## 2021-09-15 MED ORDER — AMLODIPINE BESYLATE 5 MG PO TABS
5.0000 mg | ORAL_TABLET | Freq: Once | ORAL | Status: AC
  Administered 2021-09-15: 5 mg via ORAL
  Filled 2021-09-15: qty 1

## 2021-09-15 MED ORDER — AMLODIPINE BESYLATE 10 MG PO TABS
10.0000 mg | ORAL_TABLET | Freq: Every day | ORAL | Status: DC
  Administered 2021-09-16 – 2021-09-17 (×2): 10 mg via ORAL
  Filled 2021-09-15 (×2): qty 1

## 2021-09-15 MED ORDER — INSULIN GLARGINE-YFGN 100 UNIT/ML ~~LOC~~ SOLN
30.0000 [IU] | Freq: Every day | SUBCUTANEOUS | Status: DC
  Administered 2021-09-15: 30 [IU] via SUBCUTANEOUS
  Filled 2021-09-15 (×2): qty 0.3

## 2021-09-15 MED ORDER — MENTHOL 3 MG MT LOZG
1.0000 | LOZENGE | OROMUCOSAL | Status: DC | PRN
  Administered 2021-09-16: 3 mg via ORAL
  Filled 2021-09-15: qty 9

## 2021-09-15 MED ORDER — CLOPIDOGREL BISULFATE 75 MG PO TABS
75.0000 mg | ORAL_TABLET | Freq: Every day | ORAL | Status: DC
  Administered 2021-09-15 – 2021-09-17 (×3): 75 mg via ORAL
  Filled 2021-09-15 (×3): qty 1

## 2021-09-15 NOTE — Evaluation (Signed)
Occupational Therapy Evaluation Patient Details Name: Rebecca Avila MRN: [id number] DOB: 1965-08-07 Today's Date: 09/15/2021   History of Present Illness patient is a 56 year old female who presented after a slide out of bed with increased pain in wrists, R knee, L hip and shoulder. patient was found to have polyarthralgia, and HTN. PMH: DM II, hyperlipidemia,   Clinical Impression   Patient evaluated by Occupational Therapy with no further acute OT needs identified. All education has been completed and the patient has no further questions. Patient is MI for ADLs with RW. See below for any follow-up Occupational Therapy or equipment needs. OT is signing off. Thank you for this referral.       Recommendations for follow up therapy are one component of a multi-disciplinary discharge planning process, led by the attending physician.  Recommendations may be updated based on patient status, additional functional criteria and insurance authorization.   Follow Up Recommendations  No OT follow up    Assistance Recommended at Discharge PRN  Patient can return home with the following Assistance with cooking/housework    Functional Status Assessment  Patient has not had a recent decline in their functional status  Equipment Recommendations  None recommended by OT    Recommendations for Other Services       Precautions / Restrictions Restrictions Weight Bearing Restrictions: No      Mobility Bed Mobility Overal bed mobility: Modified Independent                  Transfers                          Balance Overall balance assessment: Mild deficits observed, not formally tested                                         ADL either performed or assessed with clinical judgement   ADL Overall ADL's : Modified independent                                       General ADL Comments: patient was able to complete bed mobility, functional  mobility with RW, simulated LB dressing tasks seated in recliner, and transfers with MI. patient reported she has husband support at home as needed. patient was educated on carpal tunnel wrist braces, positioning of keyboard at home to reduce stress on wrists, and tub transfer bench. patient reported she would look into these things. recommendations for mobility specalist to follow patient made at this time.     Vision Patient Visual Report: No change from baseline       Perception     Praxis      Pertinent Vitals/Pain Pain Assessment Pain Assessment: 0-10 Pain Score: 6  Pain Location: in Bilateral wrists with movement Pain Descriptors / Indicators: Discomfort, Grimacing, Constant Pain Intervention(s): Limited activity within patient's tolerance, Monitored during session, Premedicated before session, Repositioned     Hand Dominance Right   Extremity/Trunk Assessment Upper Extremity Assessment Upper Extremity Assessment: Overall WFL for tasks assessed   Lower Extremity Assessment Lower Extremity Assessment: Defer to PT evaluation   Cervical / Trunk Assessment Cervical / Trunk Assessment: Normal   Communication     Cognition Arousal/Alertness: Awake/alert Behavior During Therapy: WFL for tasks assessed/performed Overall  Cognitive Status: Within Functional Limits for tasks assessed                                       General Comments  patient was noted to have edema in bilateral wrists R more than L.    Exercises     Shoulder Instructions      Home Living Family/patient expects to be discharged to:: Private residence Living Arrangements: Spouse/significant other Available Help at Discharge: Family;Available PRN/intermittently Type of Home: House Home Access: Level entry           Bathroom Shower/Tub: Tub/shower unit                    Prior Functioning/Environment Prior Level of Function : Independent/Modified Independent                ADLs Comments: husband helps at home with all needed tasks.        OT Problem List:        OT Treatment/Interventions:      OT Goals(Current goals can be found in the care plan section) Acute Rehab OT Goals OT Goal Formulation: All assessment and education complete, DC therapy  OT Frequency:      Co-evaluation              AM-PAC OT "6 Clicks" Daily Activity     Outcome Measure Help from another person eating meals?: None Help from another person taking care of personal grooming?: None Help from another person toileting, which includes using toliet, bedpan, or urinal?: None Help from another person bathing (including washing, rinsing, drying)?: None Help from another person to put on and taking off regular upper body clothing?: None Help from another person to put on and taking off regular lower body clothing?: None 6 Click Score: 24   End of Session Equipment Utilized During Treatment: Rolling walker (2 wheels);Gait belt Nurse Communication: Mobility status  Activity Tolerance: Patient tolerated treatment well Patient left: in chair;with call bell/phone within reach;with nursing/sitter in room  OT Visit Diagnosis: Pain                Time: 1145-1158 OT Time Calculation (min): 13 min Charges:  OT General Charges $OT Visit: 1 Visit OT Evaluation $OT Eval Low Complexity: 1 Low  Viann Shove, MS Acute Rehabilitation Department Office# [phone] Pager# [phone]   Ardyth Harps 09/15/2021, 12:28 PM

## 2021-09-15 NOTE — Evaluation (Signed)
Physical Therapy Evaluation Patient Details Name: Rebecca Avila MRN: [id number] DOB: November 29, 1965 Today's Date: 09/15/2021  History of Present Illness  patient is a 56 year old female who presented after a slide out of bed with increased pain in wrists, R knee, L hip and shoulder. patient was found to have polyarthralgia, and HTN. PMH: DM II, hyperlipidemia,  Clinical Impression  Patient evaluated by Physical Therapy with no further acute PT needs identified. All education has been completed and the patient has no further questions.  See below for any follow-up Physical Therapy or equipment needs. PT is signing off. Thank you for this referral.        Recommendations for follow up therapy are one component of a multi-disciplinary discharge planning process, led by the attending physician.  Recommendations may be updated based on patient status, additional functional criteria and insurance authorization.  Follow Up Recommendations No PT follow up      Assistance Recommended at Discharge PRN  Patient can return home with the following  Help with stairs or ramp for entrance    Equipment Recommendations None recommended by PT  Recommendations for Other Services       Functional Status Assessment Patient has not had a recent decline in their functional status     Precautions / Restrictions Restrictions Weight Bearing Restrictions: No      Mobility  Bed Mobility Overal bed mobility: Modified Independent                  Transfers Overall transfer level: Independent Equipment used: Rolling walker (2 wheels), None                    Ambulation/Gait Ambulation/Gait assistance: Supervision, Modified independent (Device/Increase time) Gait Distance (Feet): 360 Feet Assistive device: Rolling walker (2 wheels), None Gait Pattern/deviations: Step-through pattern       General Gait Details: amb longer distance with RW, good stability, no LOB. amb short distance in  room without device  Stairs            Wheelchair Mobility    Modified Rankin (Stroke Patients Only)       Balance Overall balance assessment: Modified Independent                                           Pertinent Vitals/Pain Pain Assessment Pain Assessment: 0-10 Pain Score: 6  Pain Location: in Bilateral wrists with movement and R knee Pain Descriptors / Indicators: Aching, Grimacing, Sore Pain Intervention(s): Limited activity within patient's tolerance, Monitored during session, Premedicated before session, Repositioned    Home Living Family/patient expects to be discharged to:: Private residence Living Arrangements: Spouse/significant other Available Help at Discharge: Family;Available PRN/intermittently Type of Home: House Home Access: Level entry                Prior Function Prior Level of Function : Independent/Modified Independent               ADLs Comments: husband helps at home with all needed tasks.     Hand Dominance   Dominant Hand: Right    Extremity/Trunk Assessment   Upper Extremity Assessment Upper Extremity Assessment: Defer to OT evaluation    Lower Extremity Assessment Lower Extremity Assessment: Overall WFL for tasks assessed    Cervical / Trunk Assessment Cervical / Trunk Assessment: Normal  Communication   Communication: No difficulties  Cognition Arousal/Alertness: Awake/alert Behavior During Therapy: WFL for tasks assessed/performed Overall Cognitive Status: Within Functional Limits for tasks assessed                                          General Comments General comments (skin integrity, edema, etc.): patient was noted to have edema in bilateral wrists R more than L.    Exercises     Assessment/Plan    PT Assessment Patient does not need any further PT services  PT Problem List         PT Treatment Interventions      PT Goals (Current goals can be found in  the Care Plan section)  Acute Rehab PT Goals PT Goal Formulation: All assessment and education complete, DC therapy    Frequency       Co-evaluation               AM-PAC PT "6 Clicks" Mobility  Outcome Measure Help needed turning from your back to your side while in a flat bed without using bedrails?: None Help needed moving from lying on your back to sitting on the side of a flat bed without using bedrails?: None Help needed moving to and from a bed to a chair (including a wheelchair)?: None Help needed standing up from a chair using your arms (e.g., wheelchair or bedside chair)?: None Help needed to walk in hospital room?: None Help needed climbing 3-5 steps with a railing? : A Little 6 Click Score: 23    End of Session   Activity Tolerance: Patient tolerated treatment well Patient left: in chair;with call bell/phone within reach   PT Visit Diagnosis: Other abnormalities of gait and mobility (R26.89);Difficulty in walking, not elsewhere classified (R26.2)    Time: 1194-1740 PT Time Calculation (min) (ACUTE ONLY): 14 min   Charges:   PT Evaluation $PT Eval Low Complexity: 1 Low          Rikayla, PT  Acute Rehab Dept [facility]) [phone]  [facility] Weekend Pager [facility] only)  [phone]  09/15/2021   [facility] 09/15/2021, 1:55 PM

## 2021-09-15 NOTE — Progress Notes (Signed)
PROGRESS NOTE    Rebecca Avila  UTM:[id number] DOB: 14-Oct-1965 DOA: 09/13/2021 PCP: Mack Hook, MD   Brief Narrative:  Rebecca Avila is a 56 y.o. female with medical history significant for T2DM, HTN, HLD, RBBB, morbid obesity, depression/anxiety who presented to the ED for evaluation of polyarticular pain limiting mobility and resulting in fall.   Patient reports 1 week of progressive polyarticular pain.  Initially began in her left hip then involving her left shoulder.  She went to the ED on 09/11/2021 at which time left hip and shoulder x-rays were reassuring.  Pain was improved with Flexeril and narcotic pain meds.  She was discharged to home.   Patient states that since then she has developed additional joint pain involving both of her wrists as well as her right knee.  Pain was significantly worse in her wrists limiting flexion and extension.  She says she did slide out of her bed at home onto the floor on the morning of admission but did not injure herself or lose consciousness.   She has seen some mild swelling involving some of these joints.  She denied any subjective fevers, chills, diaphoresis,any dysuria, dyspnea, chest pain. She denies any similar episodes in the past.   Of note, patient was recently started on hydralazine for blood pressure a little over 1 month ago.  About 1 week ago she was plan to discontinue hydralazine and start spironolactone 25 mg daily. Patient states that since she has not received her spironolactone she has continued taking hydralazine.   She reports an allergic reaction to NSAIDs resulting in anaphylaxis in the past.   Patient was fairly hemodynamically stable upon presentation to ED, was admitted to hospital service with further investigation and management of migrating polyarticular arthralgia.  Assessment & Plan:   Principal Problem:   Polyarthralgia Active Problems:   Hypertension associated with diabetes ([place])   Type 2 diabetes  mellitus ([place])   Hyperlipidemia associated with type 2 diabetes mellitus (HCC)  Migrating polyarthralgia:  One week of progressive polyarthralgia affecting left hip, left shoulder, right knee, and more significantly bilateral wrists and now bilateral hands.  ESR and CRP elevated.  Question of drug-induced lupus with hydralazine use. Hydralazine discontinued.  Admitting hospitalist ordered ANCA, ANA with reflex, uric acid, RPR, GC/chlamydia, I further expanded work-up and ordered antihistone antibodies, rheumatoid factor, HPV, EBV, acute viral hepatitis panel.  Viral hepatitis panel negative but rest of the labs are still pending.  Patient and her husband confirmed a monogamous relationship.  Suspicion for gonorrhea is very low on the list but labs are pending.  No rash.  Since started on IV Solu-Medrol, patient's symptoms have significantly improved.  She was not able to even flex her fingers even 5 degrees yesterday but she is able to do much more today.  We will continue steroids and wait for further work-up.   Hypertension associated with diabetes ([place]) Discontinued hydralazine and started spironolactone 25 mg daily as planned by cardiology team.  Continue home losartan-HCTZ 100-25 mg daily and Toprol-XL 100 mg daily.  I understand her amlodipine was stopped only because of lower extremity edema however currently we are running out of the options so I resumed it back at 5 mg p.o. daily and I have also added as needed labetalol.  Blood pressure still elevated so I will increase her amlodipine to 10 mg today.   Type 2 diabetes mellitus ([place]): Last hemoglobin A1c in July 2023 was 10.3.  She takes metformin and Jardiance  as well as glipizide at home.  Holding all of those.  Started on 15 units of Lantus yesterday.  She is still hyperglycemic with blood sugar over 300, will increase Lantus further to 30 units.  Continue SSI.    Hyperlipidemia associated with type 2 diabetes mellitus ([place]) Presentation more  consistent with polyarthralgia rather than myalgias/myopathy related to atorvastatin.  CK only minimally elevated at 31.  Will hold statin for now but can likely resume on discharge pending clinical progress.  Hypokalemia: Lab pending this morning.  DVT prophylaxis:    Code Status: Full Code  Family Communication: None present at bedside.  Plan of care discussed with patient in length and he/she verbalized understanding and agreed with it.  Status is: Inpatient Remains inpatient appropriate because: Still very symptomatic requiring IV steroids and extensive work-up pending.    Estimated body mass index is 47.49 kg/m as calculated from the following:   Height as of this encounter: '5\' 10"'  (1.778 m).   Weight as of this encounter: 150.1 kg.    Nutritional Assessment: Body mass index is 47.49 kg/m.[place] Kitchen Seen by dietician.  I agree with the assessment and plan as outlined below: Nutrition Status:        . Skin Assessment: I have examined the patient's skin and I agree with the wound assessment as performed by the wound care RN as outlined below:    Consultants:  None  Procedures:  None  Antimicrobials:  Anti-infectives (From admission, onward)    None         Subjective:  Seen and examined.  She is very happy that she has dramatically improved with her pain and functionality.  No new complaint.  Her left hip pain has resolved and left shoulder pain is very minimal.  Her main pain is located in bilateral wrist and hands today.  Objective: Vitals:   09/14/21 1800 09/14/21 2120 09/15/21 0555 09/15/21 0654  BP: (!) 186/102 (!) 161/87 (!) 186/112 (!) 158/88  Pulse:  92 78 83  Resp:  18 18   Temp:  98.8 F (37.1 C) 97.9 F (36.6 C)   TempSrc:  Oral Oral   SpO2:  99% 98%   Weight:      Height:        Intake/Output Summary (Last 24 hours) at 09/15/2021 0940 Last data filed at 09/15/2021 0839 Gross per 24 hour  Intake 720 ml  Output 600 ml  Net 120 ml    Filed  Weights   09/13/21 1347  Weight: (!) 150.1 kg    Examination:  General exam: Appears calm and comfortable  Respiratory system: Clear to auscultation. Respiratory effort normal. Cardiovascular system: S1 & S2 heard, RRR. No JVD, murmurs, rubs, gallops or clicks. No pedal edema. Gastrointestinal system: Abdomen is nondistended, soft and nontender. No organomegaly or masses felt. Normal bowel sounds heard. Central nervous system: Alert and oriented. No focal neurological deficits. Extremities: Restricted range of motion in hands and wrists.  Much improved compared to yesterday. Skin: No rashes, lesions or ulcers.  Psychiatry: Judgement and insight appear normal. Mood & affect appropriate.   Data Reviewed: I have personally reviewed following labs and imaging studies  CBC: Recent Labs  Lab 09/13/21 1416 09/14/21 0254 09/15/21 0909  WBC 11.2* 14.1* 14.7*  NEUTROABS 8.9*  --  13.4*  HGB 11.8* 11.3* 11.7*  HCT 37.1 35.8* 36.7  MCV 82.4 82.7 81.9  PLT 300 262 557    Basic Metabolic Panel: Recent Labs  Lab 09/13/21 1416  09/14/21 0254  NA 139 138  K 3.3* 3.4*  CL 102 103  CO2 25 24  GLUCOSE 141* 166*  BUN 9 12  CREATININE 0.97 1.09*  CALCIUM 8.9 8.5*    GFR: Estimated Creatinine Clearance: 94.2 mL/min (A) (by C-G formula based on SCr of 1.09 mg/dL (H)). Liver Function Tests: Recent Labs  Lab 09/13/21 1416  AST 7*  ALT 9  ALKPHOS 59  BILITOT 0.5  PROT 7.8  ALBUMIN 3.5    No results for input(s): "LIPASE", "AMYLASE" in the last 168 hours. No results for input(s): "AMMONIA" in the last 168 hours. Coagulation Profile: No results for input(s): "INR", "PROTIME" in the last 168 hours. Cardiac Enzymes: Recent Labs  Lab 09/13/21 1416  CKTOTAL 31*    BNP (last 3 results) No results for input(s): "PROBNP" in the last 8760 hours. HbA1C: No results for input(s): "HGBA1C" in the last 72 hours. CBG: Recent Labs  Lab 09/14/21 0727 09/14/21 1154 09/14/21 1652  09/14/21 2125 09/15/21 0749  GLUCAP 135* 208* 241* 344* 359*    Lipid Profile: No results for input(s): "CHOL", "HDL", "LDLCALC", "TRIG", "CHOLHDL", "LDLDIRECT" in the last 72 hours. Thyroid Function Tests: No results for input(s): "TSH", "T4TOTAL", "FREET4", "T3FREE", "THYROIDAB" in the last 72 hours. Anemia Panel: No results for input(s): "VITAMINB12", "FOLATE", "FERRITIN", "TIBC", "IRON", "RETICCTPCT" in the last 72 hours. Sepsis Labs: Recent Labs  Lab 09/13/21 1416  LATICACIDVEN 1.1     Recent Results (from the past 240 hour(s))  Resp Panel by RT-PCR (Flu A&B, Covid) Anterior Nasal Swab     Status: None   Collection Time: 09/13/21  2:16 PM   Specimen: Anterior Nasal Swab  Result Value Ref Range Status   SARS Coronavirus 2 by RT PCR NEGATIVE NEGATIVE Final    Comment: (NOTE) SARS-CoV-2 target nucleic acids are NOT DETECTED.  The SARS-CoV-2 RNA is generally detectable in upper respiratory specimens during the acute phase of infection. The lowest concentration of SARS-CoV-2 viral copies this assay can detect is 138 copies/mL. A negative result does not preclude SARS-Cov-2 infection and should not be used as the sole basis for treatment or other patient management decisions. A negative result may occur with  improper specimen collection/handling, submission of specimen other than nasopharyngeal swab, presence of viral mutation(s) within the areas targeted by this assay, and inadequate number of viral copies(<138 copies/mL). A negative result must be combined with clinical observations, patient history, and epidemiological information. The expected result is Negative.  Fact Sheet for Patients:  EntrepreneurPulse.com.au  Fact Sheet for Healthcare Providers:  IncredibleEmployment.be  This test is no t yet approved or cleared by the [place] FDA and  has been authorized for detection and/or diagnosis of SARS-CoV-2 by FDA under an  Emergency Use Authorization (EUA). This EUA will remain  in effect (meaning this test can be used) for the duration of the COVID-19 declaration under Section 564(b)(1) of the Act, 21 U.S.C.section 360bbb-3(b)(1), unless the authorization is terminated  or revoked sooner.       Influenza A by PCR NEGATIVE NEGATIVE Final   Influenza B by PCR NEGATIVE NEGATIVE Final    Comment: (NOTE) The Xpert Xpress SARS-CoV-2/FLU/RSV plus assay is intended as an aid in the diagnosis of influenza from Nasopharyngeal swab specimens and should not be used as a sole basis for treatment. Nasal washings and aspirates are unacceptable for Xpert Xpress SARS-CoV-2/FLU/RSV testing.  Fact Sheet for Patients: EntrepreneurPulse.com.au  Fact Sheet for Healthcare Providers: IncredibleEmployment.be  This test is not  yet approved or cleared by the [place] and has been authorized for detection and/or diagnosis of SARS-CoV-2 by FDA under an Emergency Use Authorization (EUA). This EUA will remain in effect (meaning this test can be used) for the duration of the COVID-19 declaration under Section 564(b)(1) of the Act, 21 U.S.C. section 360bbb-3(b)(1), unless the authorization is terminated or revoked.  Performed at [facility], [place] [address], [place], [place] [address]      Radiology Studies: DG Chest 2 View  Result Date: 09/13/2021 CLINICAL DATA:  Chest pain EXAM: CHEST - 2 VIEW COMPARISON:  06/15/2017 FINDINGS: Underpenetrated chest radiographs. Mild cardiomegaly. Mild diffuse bilateral interstitial pulmonary opacity. The visualized skeletal structures are unremarkable. IMPRESSION: Mild cardiomegaly with mild diffuse bilateral interstitial pulmonary opacity, likely edema. Appearance may be exaggerated by underpenetrated technique. No focal airspace opacity. Electronically Signed   By: Delanna Ahmadi M.D.   On: 09/13/2021 14:36    Scheduled Meds:   amLODipine  5 mg Oral Daily   clopidogrel  75 mg Oral Daily   enoxaparin (LOVENOX) injection  75 mg Subcutaneous Q24H   losartan  100 mg Oral Daily   And   hydrochlorothiazide  25 mg Oral Daily   insulin aspart  0-15 Units Subcutaneous TID WC   insulin aspart  0-5 Units Subcutaneous QHS   insulin glargine-yfgn  30 Units Subcutaneous Daily   methylPREDNISolone (SOLU-MEDROL) injection  40 mg Intravenous Q12H   metoprolol succinate  100 mg Oral Daily   spironolactone  25 mg Oral Daily   Continuous Infusions:   LOS: 1 day   Darliss Cheney, MD [facility]  09/15/2021, 9:40 AM   *Please note that this is a verbal dictation therefore any spelling or grammatical errors are due to the "[place] One" system interpretation.  Please page via [place] and do not message via secure chat for urgent patient care matters. Secure chat can be used for non urgent patient care matters.  How to contact the [facility] Attending or Consulting provider [place] or covering provider during after hours [place], for this patient?  Check the care team in [facility] and look for a) attending/consulting [facility] provider listed and b) the [facility] team listed. Page or secure chat [phone]. Log into [email or web address] and use [place]'s universal password to access. If you do not have the password, please contact the hospital operator. Locate the [facility] provider you are looking for under [facility] and page to a number that you can be directly reached. If you still have difficulty reaching the provider, please page the [facility] (Director on Call) for the Hospitalists listed on amion for assistance.

## 2021-09-15 NOTE — Plan of Care (Signed)
  Problem: Coping: Goal: Level of anxiety will decrease Outcome: Progressing   Problem: Pain Managment: Goal: General experience of comfort will improve Outcome: Progressing   

## 2021-09-16 DIAGNOSIS — M2550 Pain in unspecified joint: Secondary | ICD-10-CM | POA: Diagnosis not present

## 2021-09-16 LAB — BASIC METABOLIC PANEL
Anion gap: 9 (ref 5–15)
BUN: 40 mg/dL — ABNORMAL HIGH (ref 6–20)
CO2: 24 mmol/L (ref 22–32)
Calcium: 9.1 mg/dL (ref 8.9–10.3)
Chloride: 103 mmol/L (ref 98–111)
Creatinine, Ser: 1.34 mg/dL — ABNORMAL HIGH (ref 0.44–1.00)
GFR, Estimated: 47 mL/min — ABNORMAL LOW (ref >60)
Glucose, Bld: 237 mg/dL — ABNORMAL HIGH (ref 70–99)
Potassium: 4 mmol/L (ref 3.5–5.1)
Sodium: 136 mmol/L (ref 135–145)

## 2021-09-16 LAB — ANCA TITERS
Atypical P-ANCA titer: <1:20 {titer}
C-ANCA: <1:20 {titer}
P-ANCA: <1:20 {titer}

## 2021-09-16 LAB — GLUCOSE, CAPILLARY
Glucose-Capillary: 233 mg/dL — ABNORMAL HIGH (ref 70–99)
Glucose-Capillary: 332 mg/dL — ABNORMAL HIGH (ref 70–99)
Glucose-Capillary: 276 mg/dL — ABNORMAL HIGH (ref 70–99)
Glucose-Capillary: 292 mg/dL — ABNORMAL HIGH (ref 70–99)

## 2021-09-16 LAB — CBC WITH DIFFERENTIAL/PLATELET
Abs Immature Granulocytes: 0.14 10*3/uL — ABNORMAL HIGH (ref 0.00–0.07)
Basophils Absolute: 0 10*3/uL (ref 0.0–0.1)
Basophils Relative: 0 %
Eosinophils Absolute: 0 10*3/uL (ref 0.0–0.5)
Eosinophils Relative: 0 %
HCT: 36.7 % (ref 36.0–46.0)
Hemoglobin: 11.6 g/dL — ABNORMAL LOW (ref 12.0–15.0)
Immature Granulocytes: 1 %
Lymphocytes Relative: 6 %
Lymphs Abs: 1.1 10*3/uL (ref 0.7–4.0)
MCH: 26 pg (ref 26.0–34.0)
MCHC: 31.6 g/dL (ref 30.0–36.0)
MCV: 82.1 fL (ref 80.0–100.0)
Monocytes Absolute: 0.6 10*3/uL (ref 0.1–1.0)
Monocytes Relative: 3 %
Neutro Abs: 14.9 10*3/uL — ABNORMAL HIGH (ref 1.7–7.7)
Neutrophils Relative %: 90 %
Platelets: 386 10*3/uL (ref 150–400)
RBC: 4.47 MIL/uL (ref 3.87–5.11)
RDW: 14.3 % (ref 11.5–15.5)
WBC: 16.7 10*3/uL — ABNORMAL HIGH (ref 4.0–10.5)
nRBC: 0 % (ref 0.0–0.2)

## 2021-09-16 LAB — HISTONE ANTIBODIES, IGG, BLOOD: DNA-Histone: 0.3 Units (ref 0.0–0.9)

## 2021-09-16 LAB — PROCALCITONIN: Procalcitonin: 0.17 ng/mL

## 2021-09-16 LAB — SEDIMENTATION RATE: Sed Rate: 120 mm/hr — ABNORMAL HIGH (ref 0–22)

## 2021-09-16 LAB — C-REACTIVE PROTEIN: CRP: 21.1 mg/dL — ABNORMAL HIGH (ref <1.0)

## 2021-09-16 MED ORDER — SODIUM CHLORIDE 0.9 % IV SOLN: INTRAVENOUS | Status: DC

## 2021-09-16 MED ORDER — INSULIN GLARGINE-YFGN 100 UNIT/ML ~~LOC~~ SOLN
20.0000 [IU] | Freq: Two times a day (BID) | SUBCUTANEOUS | Status: DC
  Administered 2021-09-16 – 2021-09-17 (×3): 20 [IU] via SUBCUTANEOUS
  Filled 2021-09-16 (×4): qty 0.2

## 2021-09-16 NOTE — Progress Notes (Signed)
PROGRESS NOTE    Rebecca Avila  CBJ:[id number] DOB: [birth year] DOA: 09/13/2021 PCP: Mack Hook, MD   Brief Narrative:  Rebecca Avila is a 56 y.o. female with medical history significant for T2DM, HTN, HLD, RBBB, morbid obesity, depression/anxiety who presented to the ED for evaluation of polyarticular pain limiting mobility and resulting in fall.   Patient reports 1 week of progressive polyarticular pain.  Initially began in her left hip then involving her left shoulder.  She went to the ED on 09/11/2021 at which time left hip and shoulder x-rays were reassuring.  Pain was improved with Flexeril and narcotic pain meds.  She was discharged to home.   Patient states that since then she has developed additional joint pain involving both of her wrists as well as her right knee.  Pain was significantly worse in her wrists limiting flexion and extension.  She says she did slide out of her bed at home onto the floor on the morning of admission but did not injure herself or lose consciousness.   She has seen some mild swelling involving some of these joints.  She denied any subjective fevers, chills, diaphoresis,any dysuria, dyspnea, chest pain. She denies any similar episodes in the past.   Of note, patient was recently started on hydralazine for blood pressure a little over 1 month ago.  About 1 week ago she was plan to discontinue hydralazine and start spironolactone 25 mg daily. Patient states that since she has not received her spironolactone she has continued taking hydralazine.   She reports an allergic reaction to NSAIDs resulting in anaphylaxis in the past.   Patient was fairly hemodynamically stable upon presentation to ED, was admitted to hospital service with further investigation and management of migrating polyarticular arthralgia.  Assessment & Plan:   Principal Problem:   Polyarthralgia Active Problems:   Hypertension associated with diabetes ([place])   Type 2 diabetes  mellitus ([place])   Hyperlipidemia associated with type 2 diabetes mellitus ([facility])  Migrating polyarthralgia:  One week of progressive polyarthralgia affecting left hip, left shoulder, right knee, and more significantly bilateral wrists and now bilateral hands.  ESR and CRP elevated.  Question of drug-induced lupus with hydralazine use. Hydralazine discontinued.  Admitting hospitalist ordered ANCA, ANA with reflex, uric acid, RPR, GC/chlamydia, I further expanded work-up and ordered antihistone antibodies, rheumatoid factor, HPV, EBV, acute viral hepatitis panel.  Viral hepatitis panel, RPR and GC chlamydia negative but rest of the labs are still pending.  However rheumatoid factor is slightly positive.  I will further expand my work-up towards possible systemic sclerosis angiogram syndrome and order the labs accordingly and we will also order anti-CCP to further investigate about rheumatoid arthritis.  Patient has improved significantly.  She is now able to use her hands and has no pain and no tenderness in the wrist.  PT OT also saw her and signed off.  We will continue IV Solu-Medrol.  Inflammatory markers are slightly better as well.  It looks like she may be able to go home by tomorrow.  Hypertension associated with diabetes ([place]) Discontinued hydralazine and started spironolactone 25 mg daily as planned by cardiology team.  Continue home losartan-HCTZ 100-25 mg daily and Toprol-XL 100 mg daily.  I understand her amlodipine was stopped only because of lower extremity edema however currently we are running out of the options so I resumed it back at 5 mg p.o. daily and due to elevated blood pressure, we bumped it to 10 mg p.o. daily  on 09/15/2021.  Her blood pressures are still elevated but that was checked prior to giving her morning meds.  I have asked RN to check her blood pressure, if elevated, we will have to add Imdur.   Type 2 diabetes mellitus ([place]): Last hemoglobin A1c in July 2023 was 10.3.  She  takes metformin and Jardiance as well as glipizide at home.  Holding all of those.  Started on 15 units of Lantus initially, she was hyperglycemic, Lantus was increased to 30 units yesterday, she is still hyperglycemic, now I will increase this to 20 units twice daily.  Continue SSI.    Hyperlipidemia associated with type 2 diabetes mellitus ([place]) Presentation more consistent with polyarthralgia rather than myalgias/myopathy related to atorvastatin.  CK only minimally elevated at 31.  Will hold statin for now but can likely resume on discharge pending clinical progress.  Hypokalemia: Resolved  DVT prophylaxis:    Code Status: Full Code  Family Communication: None present at bedside.  Plan of care discussed with patient in length and he/she verbalized understanding and agreed with it.  Status is: Inpatient Remains inpatient appropriate because: Arthralgia improving but blood pressure elevated.  Multiple labs are pending as well.  Source of polyarthralgia is still unknown.   Estimated body mass index is 47.49 kg/m as calculated from the following:   Height as of this encounter: 5' 10" (1.778 m).   Weight as of this encounter: 150.1 kg.    Nutritional Assessment: Body mass index is 47.49 kg/m.Marland Kitchen Seen by dietician.  I agree with the assessment and plan as outlined below: Nutrition Status:        . Skin Assessment: I have examined the patient's skin and I agree with the wound assessment as performed by the wound care RN as outlined below:    Consultants:  None  Procedures:  None  Antimicrobials:  Anti-infectives (From admission, onward)    None         Subjective:  Seen and examined.  She is extremely happy with dramatic improvement in joint pain and movement in the last 2 days with IV steroids.  Objective: Vitals:   09/15/21 0654 09/15/21 1416 09/15/21 2135 09/16/21 0508  BP: (!) 158/88 (!) 144/92 (!) 161/93 (!) 175/106  Pulse: 83 72 77 66  Resp:  _0 Temp:  98.1 F (36.7 C) 98.2 F (36.8 C) 97.6 F (36.4 C)  TempSrc:  Oral Oral Oral  SpO2:  100% 99% 97%  Weight:      Height:        Intake/Output Summary (Last 24 hours) at 09/16/2021 2876 Last data filed at 09/15/2021 1757 Gross per 24 hour  Intake 480 ml  Output --  Net 480 ml    Filed Weights   09/13/21 1347  Weight: (!) 150.1 kg    Examination:  General exam: Appears calm and comfortable, obese Respiratory system: Clear to auscultation. Respiratory effort normal. Cardiovascular system: S1 & S2 heard, RRR. No JVD, murmurs, rubs, gallops or clicks. No pedal edema. Gastrointestinal system: Abdomen is nondistended, soft and nontender. No organomegaly or masses felt. Normal bowel sounds heard. Central nervous system: Alert and oriented. No focal neurological deficits. Extremities: Symmetric 5 x 5 power. Skin: No rashes, lesions or ulcers.  Psychiatry: Judgement and insight appear normal. Mood & affect appropriate.    Data Reviewed: I have personally reviewed following labs and imaging studies  CBC: Recent Labs  Lab 09/13/21 1416 09/14/21 0254 09/15/21 0909 09/16/21 0327  WBC 11.2* 14.1* 14.7* 16.7*  NEUTROABS 8.9*  --  13.4* 14.9*  HGB 11.8* 11.3* 11.7* 11.6*  HCT 37.1 35.8* 36.7 36.7  MCV 82.4 82.7 81.9 82.1  PLT 300 262 368 462    Basic Metabolic Panel: Recent Labs  Lab 09/13/21 1416 09/14/21 0254 09/15/21 0909 09/16/21 0327  NA 139 138 136 136  K 3.3* 3.4* 3.6 4.0  CL 102 103 100 103  CO2 _0 GLUCOSE 141* 166* 382* 237*  BUN 9 12 23* 40*  CREATININE 0.97 1.09* 1.24* 1.34*  CALCIUM 8.9 8.5* 8.7* 9.1    GFR: Estimated Creatinine Clearance: 76.6 mL/min (A) (by C-G formula based on SCr of 1.34 mg/dL (H)). Liver Function Tests: Recent Labs  Lab 09/13/21 1416 09/15/21 0909  AST 7* 9*  ALT 9 9  ALKPHOS 59 68  BILITOT 0.5 0.4  PROT 7.8 8.2*  ALBUMIN 3.5 3.1*    No results for input(s): "LIPASE", "AMYLASE" in the last 168  hours. No results for input(s): "AMMONIA" in the last 168 hours. Coagulation Profile: No results for input(s): "INR", "PROTIME" in the last 168 hours. Cardiac Enzymes: Recent Labs  Lab 09/13/21 1416  CKTOTAL 31*    BNP (last 3 results) No results for input(s): "PROBNP" in the last 8760 hours. HbA1C: No results for input(s): "HGBA1C" in the last 72 hours. CBG: Recent Labs  Lab 09/15/21 0749 09/15/21 1135 09/15/21 1651 09/15/21 2137 09/16/21 0841  GLUCAP 359* 344* 268* 270* 233*    Lipid Profile: No results for input(s): "CHOL", "HDL", "LDLCALC", "TRIG", "CHOLHDL", "LDLDIRECT" in the last 72 hours. Thyroid Function Tests: No results for input(s): "TSH", "T4TOTAL", "FREET4", "T3FREE", "THYROIDAB" in the last 72 hours. Anemia Panel: No results for input(s): "VITAMINB12", "FOLATE", "FERRITIN", "TIBC", "IRON", "RETICCTPCT" in the last 72 hours. Sepsis Labs: Recent Labs  Lab 09/13/21 1416  LATICACIDVEN 1.1     Recent Results (from the past 240 hour(s))  Resp Panel by RT-PCR (Flu A&B, Covid) Anterior Nasal Swab     Status: None   Collection Time: 09/13/21  2:16 PM   Specimen: Anterior Nasal Swab  Result Value Ref Range Status   SARS Coronavirus 2 by RT PCR NEGATIVE NEGATIVE Final    Comment: (NOTE) SARS-CoV-2 target nucleic acids are NOT DETECTED.  The SARS-CoV-2 RNA is generally detectable in upper respiratory specimens during the acute phase of infection. The lowest concentration of SARS-CoV-2 viral copies this assay can detect is 138 copies/mL. A negative result does not preclude SARS-Cov-2 infection and should not be used as the sole basis for treatment or other patient management decisions. A negative result may occur with  improper specimen collection/handling, submission of specimen other than nasopharyngeal swab, presence of viral mutation(s) within the areas targeted by this assay, and inadequate number of viral copies(<138 copies/mL). A negative result must  be combined with clinical observations, patient history, and epidemiological information. The expected result is Negative.  Fact Sheet for Patients:  EntrepreneurPulse.com.au  Fact Sheet for Healthcare Providers:  IncredibleEmployment.be  This test is no t yet approved or cleared by the [place] FDA and  has been authorized for detection and/or diagnosis of SARS-CoV-2 by FDA under an Emergency Use Authorization (EUA). This EUA will remain  in effect (meaning this test can be used) for the duration of the COVID-19 declaration under Section 564(b)(1) of the Act, 21 U.S.C.section 360bbb-3(b)(1), unless the authorization is terminated  or revoked sooner.       Influenza A by PCR  NEGATIVE NEGATIVE Final   Influenza B by PCR NEGATIVE NEGATIVE Final    Comment: (NOTE) The Xpert Xpress SARS-CoV-2/FLU/RSV plus assay is intended as an aid in the diagnosis of influenza from Nasopharyngeal swab specimens and should not be used as a sole basis for treatment. Nasal washings and aspirates are unacceptable for Xpert Xpress SARS-CoV-2/FLU/RSV testing.  Fact Sheet for Patients: https://www.fda.gov/media/152166/download  Fact Sheet for Healthcare Providers: https://www.fda.gov/media/152162/download  This test is not yet approved or cleared by the [place] FDA and has been authorized for detection and/or diagnosis of SARS-CoV-2 by FDA under an Emergency Use Authorization (EUA). This EUA will remain in effect (meaning this test can be used) for the duration of the COVID-19 declaration under Section 564(b)(1) of the Act, 21 U.S.C. section 360bbb-3(b)(1), unless the authorization is terminated or revoked.  Performed at [facility], [address] [place], [place] [address]      Radiology Studies: No results found.  Scheduled Meds:  amLODipine  10 mg Oral Daily   clopidogrel  75 mg Oral Daily   enoxaparin (LOVENOX)  injection  75 mg Subcutaneous Q24H   losartan  100 mg Oral Daily   And   hydrochlorothiazide  25 mg Oral Daily   insulin aspart  0-15 Units Subcutaneous TID WC   insulin aspart  0-5 Units Subcutaneous QHS   insulin glargine-yfgn  20 Units Subcutaneous BID   methylPREDNISolone (SOLU-MEDROL) injection  40 mg Intravenous Q12H   metoprolol succinate  100 mg Oral Daily   spironolactone  25 mg Oral Daily   Continuous Infusions:  sodium chloride       LOS: 2 days   Ravi Pahwani, MD [facility]  09/16/2021, 9:07 AM   *Please note that this is a verbal dictation therefore any spelling or grammatical errors are due to the "Dragon Medical One" system interpretation.  Please page via Amion and do not message via secure chat for urgent patient care matters. Secure chat can be used for non urgent patient care matters.  How to contact the [facility] Attending or Consulting provider 7A - 7P or covering provider during after hours 7P -7A, for this patient?  Check the care team in [facility] and look for a) attending/consulting [facility] provider listed and b) the [facility] team listed. Page or secure chat 7A-7P. Log into [email or web address] and use [place]'s universal password to access. If you do not have the password, please contact the hospital operator. Locate the [facility] provider you are looking for under [facility] and page to a number that you can be directly reached. If you still have difficulty reaching the provider, please page the DOC (Director on Call) for the Hospitalists listed on amion for assistance.  

## 2021-09-16 NOTE — Plan of Care (Signed)

## 2021-09-16 NOTE — Progress Notes (Signed)
Mobility Specialist - Progress Note   09/16/21 1052  Mobility  HOB Elevated/Bed Position Self regulated  Activity Ambulated independently in hallway  Range of Motion/Exercises Active  Level of Assistance Independent  Assistive Device None  Distance Ambulated (ft) 350 ft  Activity Response Tolerated well  Transport method Ambulatory  $Mobility charge 1 Mobility   Pt received in recliner and agreeable to mobility. Pt to recliner after session with all needs met.     [facility]

## 2021-09-16 NOTE — Progress Notes (Signed)
  Transition of Care [facility]) Screening Note   Patient Details  Name: Rebecca Avila Date of Birth: July 01, 1965   Transition of Care [facility]) CM/SW Contact:    Amada Jupiter, LCSW Phone Number: 09/16/2021, 9:57 AM    Transition of Care Department [facility]) has reviewed patient and no TOC needs have been identified at this time. We will continue to monitor patient advancement through interdisciplinary progression rounds. If new patient transition needs arise, please place a TOC consult.

## 2021-09-16 NOTE — Plan of Care (Signed)
Plan of care reviewed and discussed with the patient. 

## 2021-09-16 NOTE — Progress Notes (Signed)
Mobility Specialist - Progress Note    09/16/21 1506  Mobility  HOB Elevated/Bed Position Self regulated  Activity Ambulated independently in hallway  Range of Motion/Exercises Active  Level of Assistance Independent  Assistive Device None  Distance Ambulated (ft) 350 ft  Activity Response Tolerated well  Transport method Ambulatory  $Mobility charge 1 Mobility   Pt received in bed and agreeable to mobility. Pt to bed after session with all needs met.    [facility]

## 2021-09-16 NOTE — Inpatient Diabetes Management (Signed)
Inpatient Diabetes Program Recommendations  AACE/ADA: New Consensus Statement on Inpatient Glycemic Control (2015)  Target Ranges:  Prepandial:   less than 140 mg/dL      Peak postprandial:   less than 180 mg/dL (1-2 hours)      Critically ill patients:  140 - 180 mg/dL   Lab Results  Component Value Date   GLUCAP 233 (H) 09/16/2021   HGBA1C 10.3 (H) 08/15/2021    Review of Glycemic Control  Diabetes history: DM2 Outpatient Diabetes medications: Jardiance 10 mg QD, glipizide 10 mg QAM, metformin 2000 mg QD (not taking) Current orders for Inpatient glycemic control: Semglee 20 BID, Novolog 0-15 TID with meals and 0-5 HS  HgbA1C - 10.3% CBGs yesterday - 268-359 mg/dL May benefit from adding meal coverage insulin while on steroids.  Inpatient Diabetes Program Recommendations:    Consider adding Novolog 3 units TID with meals if eating > 50%  Will speak with pt about HgbA1C of 10.3%.  Continue to follow.  Thank you. Ailene Ards, RD, LDN, CDCES Inpatient Diabetes Coordinator [phone]

## 2021-09-17 DIAGNOSIS — M2550 Pain in unspecified joint: Secondary | ICD-10-CM | POA: Diagnosis not present

## 2021-09-17 LAB — HUMAN PARVOVIRUS DNA DETECTION BY PCR: Parvovirus B19, PCR: NEGATIVE

## 2021-09-17 LAB — SJOGRENS SYNDROME-B EXTRACTABLE NUCLEAR ANTIBODY: SSB (La) (ENA) Antibody, IgG: <0.2 AI (ref 0.0–0.9)

## 2021-09-17 LAB — SJOGRENS SYNDROME-A EXTRACTABLE NUCLEAR ANTIBODY: SSA (Ro) (ENA) Antibody, IgG: <0.2 AI (ref 0.0–0.9)

## 2021-09-17 LAB — GLUCOSE, CAPILLARY
Glucose-Capillary: 268 mg/dL — ABNORMAL HIGH (ref 70–99)
Glucose-Capillary: 277 mg/dL — ABNORMAL HIGH (ref 70–99)

## 2021-09-17 LAB — CYCLIC CITRUL PEPTIDE ANTIBODY, IGG/IGA: CCP Antibodies IgG/IgA: 1 units (ref 0–19)

## 2021-09-17 LAB — ANTI-SCLERODERMA ANTIBODY: Scleroderma (Scl-70) (ENA) Antibody, IgG: <0.2 AI (ref 0.0–0.9)

## 2021-09-17 MED ORDER — LANTUS SOLOSTAR 100 UNIT/ML ~~LOC~~ SOPN: 20.0000 [IU] | PEN_INJECTOR | Freq: Every day | SUBCUTANEOUS | 0 refills | Status: DC

## 2021-09-17 MED ORDER — EMPAGLIFLOZIN 10 MG PO TABS: 10.0000 mg | ORAL_TABLET | Freq: Every day | ORAL | 11 refills | Status: DC

## 2021-09-17 MED ORDER — AMLODIPINE BESYLATE 10 MG PO TABS: 10.0000 mg | ORAL_TABLET | Freq: Every day | ORAL | 1 refills | Status: DC

## 2021-09-17 MED ORDER — BLOOD GLUCOSE METER KIT: PACK | 0 refills | Status: AC

## 2021-09-17 MED ORDER — INSULIN PEN NEEDLE 31G X 5 MM MISC: 1.0000 | Freq: Every day | 0 refills | Status: DC

## 2021-09-17 MED ORDER — OXYCODONE HCL 5 MG PO TABS: 5.0000 mg | ORAL_TABLET | ORAL | 0 refills | Status: DC | PRN

## 2021-09-17 MED ORDER — PREDNISONE 10 MG PO TABS: 10.0000 mg | ORAL_TABLET | Freq: Every day | ORAL | 0 refills | Status: DC

## 2021-09-17 NOTE — Plan of Care (Signed)
  Problem: Health Behavior/Discharge Planning: Goal: Ability to manage health-related needs will improve Outcome: Progressing   Problem: Clinical Measurements: Goal: Ability to maintain clinical measurements within normal limits will improve Outcome: Progressing   Problem: Activity: Goal: Risk for activity intolerance will decrease Outcome: Progressing   Problem: Nutrition: Goal: Adequate nutrition will be maintained Outcome: Progressing   Problem: Coping: Goal: Level of anxiety will decrease Outcome: Progressing   Problem: Pain Managment: Goal: General experience of comfort will improve Outcome: Progressing   Problem: Safety: Goal: Ability to remain free from injury will improve Outcome: Progressing   

## 2021-09-17 NOTE — Discharge Summary (Signed)
Physician Discharge Summary  Rebecca Avila CMK:[id number] DOB: 07-12-1965 DOA: 09/13/2021  PCP: Mack Hook, MD  Admit date: 09/13/2021 Discharge date: 09/17/2021  Admitted From: Home Disposition:  Home  Discharge Condition:Stable CODE STATUS:FULL Diet recommendation: Carb Modified  Brief/Interim Summary:  Rebecca Avila is a 56 y.o. female with medical history significant for T2DM, HTN, HLD, RBBB, morbid obesity, depression/anxiety who presented to the ED for evaluation of polyarticular pain limiting mobility and resulting in fall.  She was complaining of pain in the left hip, left shoulder, right knee, bilateral wrist, bilateral hands.  ESR, CRP elevated.  Most of the autoimmune work-up was negative except for positive rheumatoid factor, pending cyclic Citrulline peptide antibody.  Patient was empirically started on steroids with significant improvement.  Her A1c was also found to be more than 10, diabetic coordinator following.  Since her pain has significantly improved, she will be discharged on tapering dose of prednisone.  We have strongly recommended her to follow-up with hematology as an outpatient.  At the meantime, she has also been started on insulin for diabetes.  Following problems were addressed during her hospitalization:   Polyarthralgia:  One week of progressive polyarthralgia affecting left hip, left shoulder, right knee, and more significantly bilateral wrists and now bilateral hands.  ESR and CRP elevated.  Question of drug-induced lupus with hydralazine use. Hydralazine discontinued. Negative ANCA, ANA with reflex, uric acid, RPR, GC/chlamydia, negative antibodies for Sjogren's syndrome, scleroderma.  Rheumatoid factor positive.  Pending anti-CCP. She is now able to use her hands and has no pain and no tenderness in the wrist.  PT OT also saw her and signed off.  Steroids changed to oral and she will taper it as an outpatient.  We recommend to follow-up with rheumatology  as an outpatient.  Hypertension  Discontinued hydralazine and started spironolactone 25 mg daily as planned by cardiology team.  Continue home losartan-HCTZ 100-25 mg daily and Toprol-XL 100 mg daily.Added amlodipine 10 mg daily.     Type 2 diabetes mellitus : Last hemoglobin A1c in July 2023 was 10.3.  She takes metformin and Jardiance as well as glipizide at home.  Diabetic coordinator following, started on Lantus 20 units daily.  Still cannot tolerate metformin.  We recommend to continue glipizide.  We recommend to follow-up with cardiology about Jardiance.  She was also recently prescribed on tirzepatide, but has not picked up  Hyperlipidemia: Presentation more consistent with polyarthralgia rather than myalgias/myopathy related to atorvastatin.  CK only minimally elevated at 31.    Hypokalemia: Resolved  Discharge Diagnoses:  Principal Problem:   Polyarthralgia Active Problems:   Hypertension associated with diabetes ([place])   Type 2 diabetes mellitus ([place])   Hyperlipidemia associated with type 2 diabetes mellitus [facility])    Discharge Instructions  Discharge Instructions     Diet Carb Modified   Complete by: As directed    Discharge instructions   Complete by: As directed    1)Please take prescribed medications as instructed 2)Follow up with your PCP in a week.  Do a CBC test during the follow-up 3)Monitor your blood glucose at home, take insulin as instructed 4)Follow up with rheumatology as an outpatient. Name and number of the provider has been attached.  Call for appointment   Increase activity slowly   Complete by: As directed       Allergies as of 09/17/2021       Reactions   Aspirin Anaphylaxis   Ibuprofen Anaphylaxis   Amlodipine  LE edema   Ace Inhibitors Cough        Medication List     STOP taking these medications    metFORMIN 500 MG 24 hr tablet Commonly known as: GLUCOPHAGE-XR   sertraline 100 MG tablet Commonly known as: ZOLOFT        TAKE these medications    acetaminophen 500 MG tablet Commonly known as: TYLENOL 2 tabs by mouth twice daily What changed:  how much to take how to take this when to take this reasons to take this additional instructions   amLODipine 10 MG tablet Commonly known as: NORVASC Take 1 tablet (10 mg total) by mouth daily. Start taking on: September 18, 2021   atorvastatin 40 MG tablet Commonly known as: LIPITOR Take 1 tablet (40 mg total) by mouth daily.   blood glucose meter kit and supplies Dispense based on patient and insurance preference. Use up to four times daily as directed. (FOR ICD-10 E10.9, E11.9).   Calcium Carb-Cholecalciferol 500-400 MG-UNIT Tabs Commonly known as: Calcium 500+D3 1 tab by mouth twice daily What changed:  how much to take how to take this when to take this additional instructions   clopidogrel 75 MG tablet Commonly known as: PLAVIX Take 1 tablet (75 mg total) by mouth daily.   cyclobenzaprine 10 MG tablet Commonly known as: FLEXERIL Take 1 tablet (10 mg total) by mouth 2 (two) times daily as needed for muscle spasms.   empagliflozin 10 MG Tabs tablet Commonly known as: Jardiance Take 1 tablet (10 mg total) by mouth daily before breakfast. Follow up with your cardiologist What changed: additional instructions   EPINEPHrine 0.3 mg/0.3 mL Soaj injection Commonly known as: EpiPen 2-Pak Inject 0.3 mLs (0.3 mg total) into the muscle as needed for anaphylaxis.   glipiZIDE 10 MG 24 hr tablet Commonly known as: GLUCOTROL XL Take 1 tablet by mouth once daily with breakfast   Insulin Pen Needle 31G X 5 MM Misc 1 each by Does not apply route daily.   Lantus SoloStar 100 UNIT/ML Solostar Pen Generic drug: insulin glargine Inject 20 Units into the skin daily.   losartan-hydrochlorothiazide 100-25 MG tablet Commonly known as: HYZAAR Take 1 tablet by mouth once daily   metoprolol succinate 100 MG 24 hr tablet Commonly known as:  TOPROL-XL TAKE 1 TABLET BY MOUTH ONCE DAILY(TAKE WITH OR IMMEDIATELY FOLLOWING A MEAL)   Mounjaro 2.5 MG/0.5ML Pen Generic drug: tirzepatide Inject 2.5 mg into the skin once a week.   nitroGLYCERIN 0.4 MG SL tablet Commonly known as: NITROSTAT 1 tab under tongue as needed for Chest discomfort and repeat every 5 minutes x 2 if discomfort not relieved   onetouch ultrasoft lancets Check blood sugar twice daily before meals   OneTouch Verio test strip Generic drug: glucose blood Check blood glucose twice daily before meals   OneTouch Verio w/Device Kit Check blood sugars twice daily before meals   oxyCODONE 5 MG immediate release tablet Commonly known as: Oxy IR/ROXICODONE Take 1 tablet (5 mg total) by mouth every 4 (four) hours as needed for moderate pain.   predniSONE 10 MG tablet Commonly known as: DELTASONE Take 1 tablet (10 mg total) by mouth daily. Take 4 pills daily for  2 days then 2 pills daily for 2 days then 1 pill daily for 2 days then stop   spironolactone 25 MG tablet Commonly known as: ALDACTONE Take 1 tablet (25 mg total) by mouth daily.        Follow-up Information  Mack Hook, MD. Schedule an appointment as soon as possible for a visit in 1 week(s).   Specialty: Internal Medicine Contact information: [place] [address] [phone]         Lahoma Rocker, MD. Schedule an appointment as soon as possible for a visit in 1 week(s).   Specialty: Rheumatology Contact information: [address] [place] [address] [place] [address] [phone]                Allergies  Allergen Reactions   Aspirin Anaphylaxis   Ibuprofen Anaphylaxis   Amlodipine     LE edema   Ace Inhibitors Cough    Consultations: None   Procedures/Studies: DG Chest 2 View  Result Date: 09/13/2021 CLINICAL DATA:  Chest pain EXAM: CHEST - 2 VIEW COMPARISON:  06/15/2017 FINDINGS: Underpenetrated chest radiographs. Mild cardiomegaly.  Mild diffuse bilateral interstitial pulmonary opacity. The visualized skeletal structures are unremarkable. IMPRESSION: Mild cardiomegaly with mild diffuse bilateral interstitial pulmonary opacity, likely edema. Appearance may be exaggerated by underpenetrated technique. No focal airspace opacity. Electronically Signed   By: Delanna Ahmadi M.D.   On: 09/13/2021 14:36   DG Hip Unilat With Pelvis 2-3 Views Left  Result Date: 09/11/2021 CLINICAL DATA:  Left hip pain EXAM: DG HIP (WITH OR WITHOUT PELVIS) 2-3V LEFT COMPARISON:  None Available. FINDINGS: Alignment is anatomic. No acute fracture. Joint space is preserved. No intrinsic osseous lesion. IMPRESSION: No significant osseous abnormality. Electronically Signed   By: Macy Mis M.D.   On: 09/11/2021 14:58   DG Shoulder Left  Result Date: 09/11/2021 CLINICAL DATA:  LEFT hip pain for 2 days EXAM: LEFT SHOULDER - 2+ VIEW COMPARISON:  None Available. FINDINGS: Glenohumeral joint is intact. No evidence of scapular fracture or humeral fracture. The acromioclavicular joint is intact. IMPRESSION: No fracture or dislocation. Electronically Signed   By: Suzy Bouchard M.D.   On: 09/11/2021 14:57   ECHOCARDIOGRAM COMPLETE  Result Date: 09/05/2021    ECHOCARDIOGRAM REPORT   Patient Name:   Rebecca Avila  Date of Exam: 09/05/2021 Medical Rec #:  [id number]     Height:       70.0 in Accession #:    [id number]    Weight:       332.4 lb Date of Birth:  [birth year]    BSA:          2.590 m Patient Age:    43 years      BP:           146/96 mmHg Patient Gender: F             HR:           50 bpm. Exam Location:  [address] Procedure: 2D Echo, Cardiac Doppler and Color Doppler Indications:    R07.9 Chest pain  History:        Patient has no prior history of Echocardiogram examinations.                 Signs/Symptoms:Chest Pain; Risk Factors:Morbid obesity,                 Diabetes, Hypertension and Dyslipidemia.  Sonographer:    Coralyn Helling RDCS Referring Phys:  [id number] [place]  1. Marked bradycardia (43-45 bpm) limits evaluation of diastolic function. Left ventricular ejection fraction, by estimation, is 55 to 60%. The left ventricle has normal function. The left ventricle has no regional wall motion abnormalities. There is moderate concentric left ventricular  hypertrophy. Left ventricular diastolic function could not be evaluated.  2. Right ventricular systolic function is normal. The right ventricular size is normal. There is normal pulmonary artery systolic pressure. The estimated right ventricular systolic pressure is 22.6 mmHg.  3. Left atrial size was mildly dilated.  4. The mitral valve is normal in structure. Trivial mitral valve regurgitation. No evidence of mitral stenosis.  5. Tricuspid valve regurgitation is mild to moderate.  6. The aortic valve is tricuspid. Aortic valve regurgitation is not visualized. No aortic stenosis is present.  7. The inferior vena cava is dilated in size with >50% respiratory variability, suggesting right atrial pressure of 8 mmHg. FINDINGS  Left Ventricle: Marked bradycardia (43-45 bpm) limits evaluation of diastolic function. Left ventricular ejection fraction, by estimation, is 55 to 60%. The left ventricle has normal function. The left ventricle has no regional wall motion abnormalities. The left ventricular internal cavity size was normal in size. There is moderate concentric left ventricular hypertrophy. Left ventricular diastolic function could not be evaluated due to nondiagnostic images. Left ventricular diastolic function could not be evaluated. Right Ventricle: The right ventricular size is normal. No increase in right ventricular wall thickness. Right ventricular systolic function is normal. There is normal pulmonary artery systolic pressure. The tricuspid regurgitant velocity is 2.61 m/s, and  with an assumed right atrial pressure of 8 mmHg, the estimated right ventricular systolic pressure is 33.3  mmHg. Left Atrium: Left atrial size was mildly dilated. Right Atrium: Right atrial size was normal in size. Pericardium: There is no evidence of pericardial effusion. Mitral Valve: The mitral valve is normal in structure. Trivial mitral valve regurgitation. No evidence of mitral valve stenosis. Tricuspid Valve: The tricuspid valve is normal in structure. Tricuspid valve regurgitation is mild to moderate. No evidence of tricuspid stenosis. Aortic Valve: The aortic valve is tricuspid. Aortic valve regurgitation is not visualized. No aortic stenosis is present. Pulmonic Valve: The pulmonic valve was normal in structure. Pulmonic valve regurgitation is mild. No evidence of pulmonic stenosis. Aorta: The aortic root and ascending aorta are structurally normal, with no evidence of dilitation. Venous: The inferior vena cava is dilated in size with greater than 50% respiratory variability, suggesting right atrial pressure of 8 mmHg. IAS/Shunts: No atrial level shunt detected by color flow Doppler.  LEFT VENTRICLE PLAX 2D LVIDd:         5.40 cm   Diastology LVIDs:         3.50 cm   LV e' medial:    8.38 cm/s LV PW:         1.30 cm   LV E/e' medial:  14.0 LV IVS:        1.50 cm   LV e' lateral:   10.00 cm/s LVOT diam:     2.00 cm   LV E/e' lateral: 11.7 LV SV:         60 LV SV Index:   23 LVOT Area:     3.14 cm  RIGHT VENTRICLE             IVC RV S prime:     13.30 cm/s  IVC diam: 2.20 cm TAPSE (M-mode): 2.0 cm RVSP:           30.2 mmHg LEFT ATRIUM             Index        RIGHT ATRIUM           Index LA diam:  4.20 cm 1.62 cm/m   RA Pressure: 3.00 mmHg LA Vol (A2C):   77.5 ml 29.92 ml/m  RA Area:     14.00 cm LA Vol (A4C):   78.2 ml 30.19 ml/m  RA Volume:   39.10 ml  15.09 ml/m LA Biplane Vol: 84.4 ml 32.58 ml/m  AORTIC VALVE LVOT Vmax:   79.80 cm/s LVOT Vmean:  54.100 cm/s LVOT VTI:    0.191 m  AORTA Ao Root diam: 3.20 cm Ao Asc diam:  3.50 cm MITRAL VALVE                TRICUSPID VALVE MV Area (PHT): 3.28 cm      TR Peak grad:   27.2 mmHg MV Decel Time: 231 msec     TR Vmax:        261.00 cm/s MV E velocity: 117.00 cm/s  Estimated RAP:  3.00 mmHg MV A velocity: 86.50 cm/s   RVSP:           30.2 mmHg MV E/A ratio:  1.35                             SHUNTS                             Systemic VTI:  0.19 m                             Systemic Diam: 2.00 cm Dani Gobble Croitoru MD Electronically signed by Sanda Klein MD Signature Date/Time: 09/05/2021/2:39:29 PM    Final       Subjective: Patient seen and examined at the bedside this morning.  Hemodynamically stable for discharge today.  Pain well controlled but he still has some on the left shoulder.  Able to ambulate well and ready to go home  Discharge Exam: Vitals:   09/17/21 0600 09/17/21 1016  BP: (!) 146/92 (!) 148/84  Pulse: (!) 56 62  Resp:  18  Temp: 97.9 F (36.6 C) 97.9 F (36.6 C)  SpO2: 97% 99%   Vitals:   09/16/21 2025 09/17/21 0427 09/17/21 0600 09/17/21 1016  BP: (!) 154/92 (!) 166/106 (!) 146/92 (!) 148/84  Pulse: 60 (!) 58 (!) 56 62  Resp: '18 16  18  ' Temp: 98.1 F (36.7 C) 97.8 F (36.6 C) 97.9 F (36.6 C) 97.9 F (36.6 C)  TempSrc: Oral  Oral Oral  SpO2: 97% 95% 97% 99%  Weight:      Height:        General: Pt is alert, awake, not in acute distress Cardiovascular: RRR, S1/S2 +, no rubs, no gallops Respiratory: CTA bilaterally, no wheezing, no rhonchi Abdominal: Soft, NT, ND, bowel sounds + Extremities: no edema, no cyanosis    The results of significant diagnostics from this hospitalization (including imaging, microbiology, ancillary and laboratory) are listed below for reference.     Microbiology: Recent Results (from the past 240 hour(s))  Resp Panel by RT-PCR (Flu A&B, Covid) Anterior Nasal Swab     Status: None   Collection Time: 09/13/21  2:16 PM   Specimen: Anterior Nasal Swab  Result Value Ref Range Status   SARS Coronavirus 2 by RT PCR NEGATIVE NEGATIVE Final    Comment: (NOTE) SARS-CoV-2 target  nucleic acids are NOT DETECTED.  The SARS-CoV-2 RNA is generally detectable in upper respiratory specimens during  the acute phase of infection. The lowest concentration of SARS-CoV-2 viral copies this assay can detect is 138 copies/mL. A negative result does not preclude SARS-Cov-2 infection and should not be used as the sole basis for treatment or other patient management decisions. A negative result may occur with  improper specimen collection/handling, submission of specimen other than nasopharyngeal swab, presence of viral mutation(s) within the areas targeted by this assay, and inadequate number of viral copies(<138 copies/mL). A negative result must be combined with clinical observations, patient history, and epidemiological information. The expected result is Negative.  Fact Sheet for Patients:  EntrepreneurPulse.com.au  Fact Sheet for Healthcare Providers:  IncredibleEmployment.be  This test is no t yet approved or cleared by the [place] FDA and  has been authorized for detection and/or diagnosis of SARS-CoV-2 by FDA under an Emergency Use Authorization (EUA). This EUA will remain  in effect (meaning this test can be used) for the duration of the COVID-19 declaration under Section 564(b)(1) of the Act, 21 U.S.C.section 360bbb-3(b)(1), unless the authorization is terminated  or revoked sooner.       Influenza A by PCR NEGATIVE NEGATIVE Final   Influenza B by PCR NEGATIVE NEGATIVE Final    Comment: (NOTE) The Xpert Xpress SARS-CoV-2/FLU/RSV plus assay is intended as an aid in the diagnosis of influenza from Nasopharyngeal swab specimens and should not be used as a sole basis for treatment. Nasal washings and aspirates are unacceptable for Xpert Xpress SARS-CoV-2/FLU/RSV testing.  Fact Sheet for Patients: EntrepreneurPulse.com.au  Fact Sheet for Healthcare  Providers: IncredibleEmployment.be  This test is not yet approved or cleared by the [place] FDA and has been authorized for detection and/or diagnosis of SARS-CoV-2 by FDA under an Emergency Use Authorization (EUA). This EUA will remain in effect (meaning this test can be used) for the duration of the COVID-19 declaration under Section 564(b)(1) of the Act, 21 U.S.C. section 360bbb-3(b)(1), unless the authorization is terminated or revoked.  Performed at [facility], [place] [address], [place], [place] [address]      Labs: BNP (last 3 results) Recent Labs    09/13/21 1800  BNP 88.8   Basic Metabolic Panel: Recent Labs  Lab 09/13/21 1416 09/14/21 0254 09/15/21 0909 09/16/21 0327  NA 139 138 136 136  K 3.3* 3.4* 3.6 4.0  CL 102 103 100 103  CO2 '25 24 23 24  ' GLUCOSE 141* 166* 382* 237*  BUN 9 12 23* 40*  CREATININE 0.97 1.09* 1.24* 1.34*  CALCIUM 8.9 8.5* 8.7* 9.1   Liver Function Tests: Recent Labs  Lab 09/13/21 1416 09/15/21 0909  AST 7* 9*  ALT 9 9  ALKPHOS 59 68  BILITOT 0.5 0.4  PROT 7.8 8.2*  ALBUMIN 3.5 3.1*   No results for input(s): "LIPASE", "AMYLASE" in the last 168 hours. No results for input(s): "AMMONIA" in the last 168 hours. CBC: Recent Labs  Lab 09/13/21 1416 09/14/21 0254 09/15/21 0909 09/16/21 0327  WBC 11.2* 14.1* 14.7* 16.7*  NEUTROABS 8.9*  --  13.4* 14.9*  HGB 11.8* 11.3* 11.7* 11.6*  HCT 37.1 35.8* 36.7 36.7  MCV 82.4 82.7 81.9 82.1  PLT 300 262 368 386   Cardiac Enzymes: Recent Labs  Lab 09/13/21 1416  CKTOTAL 31*   BNP: Invalid input(s): "POCBNP" CBG: Recent Labs  Lab 09/16/21 1234 09/16/21 1710 09/16/21 2213 09/17/21 0747 09/17/21 1216  GLUCAP 332* 276* 292* 268* 277*   D-Dimer No results for input(s): "DDIMER" in the last 72 hours. Hgb  A1c No results for input(s): "HGBA1C" in the last 72 hours. Lipid Profile No results for input(s): "CHOL", "HDL", "LDLCALC",  "TRIG", "CHOLHDL", "LDLDIRECT" in the last 72 hours. Thyroid function studies No results for input(s): "TSH", "T4TOTAL", "T3FREE", "THYROIDAB" in the last 72 hours.  Invalid input(s): "FREET3" Anemia work up No results for input(s): "VITAMINB12", "FOLATE", "FERRITIN", "TIBC", "IRON", "RETICCTPCT" in the last 72 hours. Urinalysis    Component Value Date/Time   COLORURINE YELLOW 09/13/2021 2051   APPEARANCEUR CLEAR 09/13/2021 2051   LABSPEC 1.011 09/13/2021 2051   PHURINE 6.0 09/13/2021 2051   GLUCOSEU NEGATIVE 09/13/2021 2051   HGBUR NEGATIVE 09/13/2021 2051   BILIRUBINUR NEGATIVE 09/13/2021 2051   BILIRUBINUR negative 04/18/2020 1130   KETONESUR NEGATIVE 09/13/2021 2051   PROTEINUR NEGATIVE 09/13/2021 2051   UROBILINOGEN 0.2 04/18/2020 1130   UROBILINOGEN 0.2 07/23/2014 0945   NITRITE NEGATIVE 09/13/2021 2051   LEUKOCYTESUR NEGATIVE 09/13/2021 2051   Sepsis Labs Recent Labs  Lab 09/13/21 1416 09/14/21 0254 09/15/21 0909 09/16/21 0327  WBC 11.2* 14.1* 14.7* 16.7*   Microbiology Recent Results (from the past 240 hour(s))  Resp Panel by RT-PCR (Flu A&B, Covid) Anterior Nasal Swab     Status: None   Collection Time: 09/13/21  2:16 PM   Specimen: Anterior Nasal Swab  Result Value Ref Range Status   SARS Coronavirus 2 by RT PCR NEGATIVE NEGATIVE Final    Comment: (NOTE) SARS-CoV-2 target nucleic acids are NOT DETECTED.  The SARS-CoV-2 RNA is generally detectable in upper respiratory specimens during the acute phase of infection. The lowest concentration of SARS-CoV-2 viral copies this assay can detect is 138 copies/mL. A negative result does not preclude SARS-Cov-2 infection and should not be used as the sole basis for treatment or other patient management decisions. A negative result may occur with  improper specimen collection/handling, submission of specimen other than nasopharyngeal swab, presence of viral mutation(s) within the areas targeted by this assay, and  inadequate number of viral copies(<138 copies/mL). A negative result must be combined with clinical observations, patient history, and epidemiological information. The expected result is Negative.  Fact Sheet for Patients:  EntrepreneurPulse.com.au  Fact Sheet for Healthcare Providers:  IncredibleEmployment.be  This test is no t yet approved or cleared by the [place] FDA and  has been authorized for detection and/or diagnosis of SARS-CoV-2 by FDA under an Emergency Use Authorization (EUA). This EUA will remain  in effect (meaning this test can be used) for the duration of the COVID-19 declaration under Section 564(b)(1) of the Act, 21 U.S.C.section 360bbb-3(b)(1), unless the authorization is terminated  or revoked sooner.       Influenza A by PCR NEGATIVE NEGATIVE Final   Influenza B by PCR NEGATIVE NEGATIVE Final    Comment: (NOTE) The Xpert Xpress SARS-CoV-2/FLU/RSV plus assay is intended as an aid in the diagnosis of influenza from Nasopharyngeal swab specimens and should not be used as a sole basis for treatment. Nasal washings and aspirates are unacceptable for Xpert Xpress SARS-CoV-2/FLU/RSV testing.  Fact Sheet for Patients: EntrepreneurPulse.com.au  Fact Sheet for Healthcare Providers: IncredibleEmployment.be  This test is not yet approved or cleared by the [place] FDA and has been authorized for detection and/or diagnosis of SARS-CoV-2 by FDA under an Emergency Use Authorization (EUA). This EUA will remain in effect (meaning this test can be used) for the duration of the COVID-19 declaration under Section 564(b)(1) of the Act, 21 U.S.C. section 360bbb-3(b)(1), unless the authorization is terminated or revoked.  Performed at  [facility], [place] [address] [place], [place] [address]     Please note: You were cared for by a hospitalist during your hospital stay.  Once you are discharged, your primary care physician will handle any further medical issues. Please note that NO REFILLS for any discharge medications will be authorized once you are discharged, as it is imperative that you return to your primary care physician (or establish a relationship with a primary care physician if you do not have one) for your post hospital discharge needs so that they can reassess your need for medications and monitor your lab values.    Time coordinating discharge: 40 minutes  SIGNED:   Shelly Coss, MD  [facility] 09/17/2021, 2:09 PM Pager [phone]  If 7PM-7AM, please contact night-coverage [email or web address] Password [email or web address]

## 2021-09-17 NOTE — TOC Benefit Eligibility Note (Signed)
Transition of Care [facility]) Benefit Eligibility Note    Patient Details  Name: Rebecca Avila MRN: [id number] Date of Birth: 04-03-1965   Medication/Dose: Lantus 20 units QD  Covered?:  (Levemir and Tyler Aas not on formulary)  Tier: Other  Prescription Coverage Preferred Pharmacy: local  Spoke with Person/Company/Phone Number:: Josephine/ [employer] [phone]  Co-Pay: Preferred Rx is Lantus copay is $50.00  Prior Approval: No  Deductible: Met       Kerin Salen Phone Number: 09/17/2021, 1:26 PM

## 2021-09-17 NOTE — TOC Transition Note (Signed)
Transition of Care [facility]) - CM/SW Discharge Note  Patient Details  Name: Rebecca Avila MRN: [id number] Date of Birth: 04/23/1965  Transition of Care [facility]) CM/SW Contact:  Ewing Schlein, LCSW Phone Number: 09/17/2021, 1:35 PM  Clinical Narrative: [facility] consulted for basal insulin benefits check. Patient's insurance covers Lantus and it will be a $50.00 copay. Patient updated. TOC signing off.  Final next level of care: Home/Self Care Barriers to Discharge: Barriers Resolved  Patient Goals and CMS Choice Choice offered to / list presented to : NA  Discharge Plan and Services        DME Arranged: N/A DME Agency: NA  Readmission Risk Interventions    09/16/2021    9:57 AM  Readmission Risk Prevention Plan  Post Dischage Appt Complete  Medication Screening Complete  Transportation Screening Complete

## 2021-09-17 NOTE — Inpatient Diabetes Management (Signed)
Inpatient Diabetes Program Recommendations  AACE/ADA: New Consensus Statement on Inpatient Glycemic Control (2015)  Target Ranges:  Prepandial:   less than 140 mg/dL      Peak postprandial:   less than 180 mg/dL (1-2 hours)      Critically ill patients:  140 - 180 mg/dL   Lab Results  Component Value Date   GLUCAP 268 (H) 09/17/2021   HGBA1C 10.3 (H) 08/15/2021    Review of Glycemic Control  Spoke with pt about HgbA1C of 10.3%. Has prescriptions for Jardiance and Mounjaro at pharmacy, but hasn't picked them up yet. Pt has concerns about taking Jardiance d/t possible UTI side effects. Pt said she had hx UTIs. Will need to speak with cardiologist who prescribed this. Takes glipizide 10 mg QAM. Stopped metformin d/t GI side effects. Pt is agreeable to go home on Lantus pen. Discussed as she loses weight, her needs will likely come down. Stressed importance of checking blood sugars at least 3-4x/day. Has f/u appt with PCP at end of month. Reviewed hypoglycemia s/s and treatment.  Educated patient on insulin pen use at home. Reviewed contents of insulin flexpen starter kit. Reviewed all steps if insulin pen including attachment of needle, 2-unit air shot, dialing up dose, giving injection, removing needle, disposal of sharps, storage of unused insulin, disposal of insulin etc. Patient able to provide successful return demonstration. Also reviewed troubleshooting with insulin pen. MD to give patient Rxs for insulin pens and insulin pen needles.  Answered all questions. Discussed healthy diet with portion control and importance of exercise in controlling blood sugars. Pt has started walking some at work.   Expect good compliance.  Thank you. Lorenda Peck, RD, LDN, [place] Inpatient Diabetes Coordinator [phone]

## 2021-09-17 NOTE — Progress Notes (Signed)
Mobility Specialist - Progress Note    09/17/21 1054  Mobility  HOB Elevated/Bed Position Self regulated  Activity Ambulated with assistance in hallway  Range of Motion/Exercises Active  Level of Assistance Independent  Assistive Device None  Distance Ambulated (ft) 350 ft  Activity Response Tolerated well  Transport method Ambulatory  $Mobility charge 1 Mobility   Pt received in bed and agreeable to mobility. Pt to bed after session with all needs met.    [facility]

## 2021-09-17 NOTE — Progress Notes (Signed)
Provided discharge education/instructions, all questions and concerns addressed. Pt not in acute distress, discharged home with belongings accompanied by husband. 

## 2021-09-18 ENCOUNTER — Ambulatory Visit: Payer: No Typology Code available for payment source | Admitting: Podiatry

## 2021-09-18 LAB — EPSTEIN-BARR VIRUS (EBV) ANTIBODY PROFILE
EBV NA IgG: 245 U/mL — ABNORMAL HIGH (ref 0.0–17.9)
EBV VCA IgG: >600 U/mL — ABNORMAL HIGH (ref 0.0–17.9)
EBV VCA IgM: <36 U/mL (ref 0.0–35.9)

## 2021-09-23 ENCOUNTER — Encounter: Payer: Self-pay | Admitting: Internal Medicine

## 2021-09-23 ENCOUNTER — Ambulatory Visit: Payer: Commercial Managed Care - HMO | Admitting: Internal Medicine

## 2021-09-23 NOTE — Progress Notes (Signed)
Patient left before could be seen. Will call her and ask she schedule being seen at least for her recent joint concerns.

## 2021-09-25 ENCOUNTER — Other Ambulatory Visit: Payer: Self-pay | Admitting: Internal Medicine

## 2021-09-26 ENCOUNTER — Ambulatory Visit: Payer: Commercial Managed Care - HMO | Admitting: Internal Medicine

## 2021-09-26 ENCOUNTER — Encounter: Payer: Self-pay | Admitting: Internal Medicine

## 2021-09-26 VITALS — BP 136/86 | HR 72 | Resp 16 | Ht 70.0 in | Wt 318.0 lb

## 2021-09-26 DIAGNOSIS — E1100 Type 2 diabetes mellitus with hyperosmolarity without nonketotic hyperglycemic-hyperosmolar coma (NKHHC): Secondary | ICD-10-CM

## 2021-09-26 DIAGNOSIS — I10 Essential (primary) hypertension: Secondary | ICD-10-CM

## 2021-09-26 DIAGNOSIS — Z79899 Other long term (current) drug therapy: Secondary | ICD-10-CM

## 2021-09-26 DIAGNOSIS — M130 Polyarthritis, unspecified: Secondary | ICD-10-CM

## 2021-09-26 MED ORDER — OXYCODONE HCL 5 MG PO TABS: ORAL_TABLET | ORAL | 0 refills | Status: DC

## 2021-09-26 MED ORDER — CYCLOBENZAPRINE HCL 10 MG PO TABS: ORAL_TABLET | ORAL | 1 refills | Status: DC

## 2021-09-26 MED ORDER — LANTUS SOLOSTAR 100 UNIT/ML ~~LOC~~ SOPN
PEN_INJECTOR | SUBCUTANEOUS | 11 refills | Status: DC
Start: 2021-09-26 — End: 2022-11-22

## 2021-09-26 NOTE — Progress Notes (Signed)
Subjective:    Patient ID: Rebecca Avila, female   DOB: 1965/04/28, 56 y.o.   MRN: [id number]   HPI   Polyarthritis:  Pt. Initially started with pain around 09/09/2021 with acute throbbing left hip pain and shoulder.  She does not recall and injury, but does state she had started walking for about an hour in parking lot of work for about 2 weeks prior to joint discomfort.   She went to the ED on 09/11/21 due to the severe pain, xrays of both joints appeared normal and was discharged on cyclobenzaprine.   The following day, she developed pain in her bilateral wrists and hands, particularly over lateral MCP joints and dorsal hands associated with erythema and heat.  Also, developed pain in right knee.  Cannot say if was swollen or red.   On 09/11/21, she fell due to pain and stiffness and could also not push off the bed to stand as her hands and wrists hurt so significantly.  She called EMS and ultimately admitted to hospital. Sed Rate and CRP were quite high at 125 and 33.2 respectively, RF somewhat elevated at 18.2, but after discharge, CCP Ab IgG/IgA were negative.  She had been started on Hydralazine for hypertension mid July.  P-ANCA and atypical and C-ANCA, however, negative.  She does state her swelling and pain of hands, shoulder and hip and knee pain had significantly improved significantly as well.  States when she was here 3 days ago, but had to leave due to pain during wait, was improved, but still apparent.  Gone now.   Also, CK, DNA-histone, SSA, SSB and Scl-70 IgG all negative. Uric acid was somewhat high at 7.2 Hemoglobin was slightly lower at 11.8, down from 12.7 one month earlier She denies significant stiffness of joints, but describes her shoulder girdle as being tight in the morning in particular and later in day perhaps after sitting for a while.  Perhaps has pelvic girdle stiffness in the morning. Did not note any headaches until last two days--frontal area.  Not a pounding  pain.   No visual changes with the headache. No symptoms of jaw claudication No associated fever with her symptoms No family history of inflammatory arthritis or autoimmune disease of which she is aware.   States her shoulder pain is unlike the chest pain for which she was sent to Cardiology in July.    2.  DM:  sugars in high 100s fasting and in evening, high 200s.  Was initiated on Jardiance by Cardiology.  She stopped metformin ER due to GI bloating and diarrhea.  Taking Lantus 20 units in morning, Glipizide ER 10 mg daily as well.      Current Meds  Medication Sig   acetaminophen (TYLENOL) 500 MG tablet 2 tabs by mouth twice daily (Patient taking differently: Take 1,000 mg by mouth every 6 (six) hours as needed for moderate pain.)   amLODipine (NORVASC) 10 MG tablet Take 1 tablet (10 mg total) by mouth daily.   atorvastatin (LIPITOR) 40 MG tablet Take 1 tablet (40 mg total) by mouth daily.   blood glucose meter kit and supplies Dispense based on patient and insurance preference. Use up to four times daily as directed. (FOR ICD-10 E10.9, E11.9).   Blood Glucose Monitoring Suppl (ONETOUCH VERIO) w/Device KIT Check blood sugars twice daily before meals   Calcium Carb-Cholecalciferol (CALCIUM 500+D3) 500-400 MG-UNIT TABS 1 tab by mouth twice daily (Patient taking differently: Take 1 tablet by mouth 2 (two)  times daily. 1 tab by mouth twice daily)   clopidogrel (PLAVIX) 75 MG tablet Take 1 tablet (75 mg total) by mouth daily.   cyclobenzaprine (FLEXERIL) 10 MG tablet Take 1 tablet (10 mg total) by mouth 2 (two) times daily as needed for muscle spasms.   empagliflozin (JARDIANCE) 10 MG TABS tablet Take 1 tablet (10 mg total) by mouth daily before breakfast. Follow up with your cardiologist   glipiZIDE (GLUCOTROL XL) 10 MG 24 hr tablet Take 1 tablet by mouth once daily with breakfast   glucose blood (ONETOUCH VERIO) test strip Check blood glucose twice daily before meals   insulin glargine  (LANTUS SOLOSTAR) 100 UNIT/ML Solostar Pen Inject 20 Units into the skin daily.   Insulin Pen Needle 31G X 5 MM MISC 1 each by Does not apply route daily.   Lancets (ONETOUCH ULTRASOFT) lancets Check blood sugar twice daily before meals   losartan-hydrochlorothiazide (HYZAAR) 100-25 MG tablet Take 1 tablet by mouth once daily   metoprolol succinate (TOPROL-XL) 100 MG 24 hr tablet TAKE 1 TABLET BY MOUTH ONCE DAILY (TAKE  WITH  OR  IMMEDIATELY  FOLLOWING  A  MEAL)   nitroGLYCERIN (NITROSTAT) 0.4 MG SL tablet 1 tab under tongue as needed for Chest discomfort and repeat every 5 minutes x 2 if discomfort not relieved   oxyCODONE (OXY IR/ROXICODONE) 5 MG immediate release tablet Take 1 tablet (5 mg total) by mouth every 4 (four) hours as needed for moderate pain.   spironolactone (ALDACTONE) 25 MG tablet Take 1 tablet (25 mg total) by mouth daily.   Allergies  Allergen Reactions   Aspirin Anaphylaxis   Ibuprofen Anaphylaxis   Amlodipine     LE edema   Ace Inhibitors Cough     Review of Systems    Objective:   BP 136/86 (BP Location: Left Arm, Patient Position: Sitting, Cuff Size: Normal)   Pulse 72   Resp 16   Ht _0  (1.778 m)   Wt (!) 318 lb (144.2 kg)   BMI 45.63 kg/m   Physical Exam NAD HEENT:  Temporal arteries bilaterally soft/springy and normodynamic pulses.  PERRL, EOMI, Discs sharp bilaterally.  TMs pearly gray, throat without injection. Neck:  Supple. No adenopathy, no thyromegaly Chest:  CTA CV:  RRR without murmur or rub.  Radial and DP pulses normal and equal Abd:  S, NT, No HSM or mass, + BS MS:  No swelling of soft tissues of dorsal hands today.  Normal range of motion of all joints without palpable effusion or erythema.  Mild tenderness of joint lines, bilateral  knees.   Skin:  no rash    Assessment & Plan    Polyarthralgia/polyarthritis: Recheck ESR/CRP.  Referral to Rheumatology as unclear as to what caused her symptoms/findings.  To call if symptoms  worsen/recur.  Unlikely to have GCA and would like to avoid prednisone long term with her DM that remains poorly controlled.  She is still having intermittent pain in joints.  Has allergy to NSAIDS, so will give her one more Rx of Oxycodone.  She feels cyclobenzaprine is helpful as well as can have associated muscle spasm.  2.  DM:  did not tolerate Metformin ER with one dose schedule.  She does not remember evening meds frequently.  Does not want to restart at a lower dose again.  Increase Lantus to 25 units daily.  Continue Glipizide XL, Jardiance.  Consider switch to GLP 1 receptor agonist once rheumatologic status clearer.  Later, see  she was given Rx for [facility] from Cardiology as well.  Lot of changes going on with meds in past 2 months and concerned with cause of joint issues currently.   3.  Hypertension:  Will need to check back with patient as see in Cardiology note Amlodipine discontinued with them and her peripheral edema improved.  Her BP went up and Hydralazine discontinued as she was not taking afternoon dose.  She stated she was taking again today.  Cardiology sending for sleep study as well.  4.  Chest pain:  echo without significant findings in August.  On plavix due to ASA allergy.  Is to have coronary CT soon.  She has not had recurrence of chest pain since I last saw her.

## 2021-09-27 LAB — BASIC METABOLIC PANEL
BUN/Creatinine Ratio: 19 (ref 9–23)
BUN: 19 mg/dL (ref 6–24)
CO2: 26 mmol/L (ref 20–29)
Calcium: 9.6 mg/dL (ref 8.7–10.2)
Chloride: 98 mmol/L (ref 96–106)
Creatinine, Ser: 1 mg/dL (ref 0.57–1.00)
Glucose: 172 mg/dL — ABNORMAL HIGH (ref 70–99)
Potassium: 4.4 mmol/L (ref 3.5–5.2)
Sodium: 142 mmol/L (ref 134–144)
eGFR: 67 mL/min/{1.73_m2} (ref >59)

## 2021-09-27 LAB — C-REACTIVE PROTEIN: CRP: 30 mg/L — ABNORMAL HIGH (ref 0–10)

## 2021-09-27 LAB — SEDIMENTATION RATE: Sed Rate: 43 mm/hr — ABNORMAL HIGH (ref 0–40)

## 2021-09-30 ENCOUNTER — Ambulatory Visit: Payer: No Typology Code available for payment source | Admitting: Podiatry

## 2021-10-06 ENCOUNTER — Telehealth: Payer: Self-pay

## 2021-10-06 NOTE — Telephone Encounter (Signed)
Patient called asking to change her rheumatology referral to a [facility] referral. [facility] does not have any openings until march 2024. I called [facility] and found that they have openings in October

## 2021-10-07 ENCOUNTER — Ambulatory Visit: Payer: No Typology Code available for payment source | Admitting: Podiatry

## 2021-10-10 ENCOUNTER — Ambulatory Visit: Payer: 59 | Attending: Internal Medicine

## 2021-10-10 ENCOUNTER — Ambulatory Visit (INDEPENDENT_AMBULATORY_CARE_PROVIDER_SITE_OTHER): Payer: 59 | Admitting: Pharmacist

## 2021-10-10 VITALS — BP 118/82 | HR 75 | Wt 318.0 lb

## 2021-10-10 DIAGNOSIS — E1159 Type 2 diabetes mellitus with other circulatory complications: Secondary | ICD-10-CM | POA: Diagnosis not present

## 2021-10-10 DIAGNOSIS — I152 Hypertension secondary to endocrine disorders: Secondary | ICD-10-CM

## 2021-10-10 DIAGNOSIS — Z6841 Body Mass Index (BMI) 40.0 and over, adult: Secondary | ICD-10-CM

## 2021-10-10 DIAGNOSIS — E1169 Type 2 diabetes mellitus with other specified complication: Secondary | ICD-10-CM

## 2021-10-10 LAB — COMPREHENSIVE METABOLIC PANEL
ALT: 8 IU/L (ref 0–32)
AST: 7 IU/L (ref 0–40)
Albumin/Globulin Ratio: 1.5 (ref 1.2–2.2)
Albumin: 4.3 g/dL (ref 3.8–4.9)
Alkaline Phosphatase: 100 IU/L (ref 44–121)
BUN/Creatinine Ratio: 15 (ref 9–23)
BUN: 17 mg/dL (ref 6–24)
Bilirubin Total: 0.3 mg/dL (ref 0.0–1.2)
CO2: 25 mmol/L (ref 20–29)
Calcium: 9.3 mg/dL (ref 8.7–10.2)
Chloride: 97 mmol/L (ref 96–106)
Creatinine, Ser: 1.1 mg/dL — ABNORMAL HIGH (ref 0.57–1.00)
Globulin, Total: 2.9 g/dL (ref 1.5–4.5)
Glucose: 249 mg/dL — ABNORMAL HIGH (ref 70–99)
Potassium: 4 mmol/L (ref 3.5–5.2)
Sodium: 139 mmol/L (ref 134–144)
Total Protein: 7.2 g/dL (ref 6.0–8.5)
eGFR: 60 mL/min/{1.73_m2} (ref >59)

## 2021-10-10 LAB — LIPID PANEL
Chol/HDL Ratio: 4.2 ratio (ref 0.0–4.4)
Cholesterol, Total: 144 mg/dL (ref 100–199)
HDL: 34 mg/dL — ABNORMAL LOW (ref >39)
LDL Chol Calc (NIH): 85 mg/dL (ref 0–99)
Triglycerides: 138 mg/dL (ref 0–149)
VLDL Cholesterol Cal: 25 mg/dL (ref 5–40)

## 2021-10-10 NOTE — Progress Notes (Unsigned)
Patient ID: RUSSELL ENGELSTAD                 DOB: 01-02-1966                    MRN: [id number]     HPI: Rebecca Avila is a 56 y.o. female patient referred to pharmacy clinic by Dr. Ali Lowe to initiate weight loss therapy with GLP1-RA. PMH is significant for obesity complicated by chronic medical conditions including T2DM (on metformin, glipizide and Jardiance), HTN, HLD, CKDII, and anxiety. Baseline weight 331 lbs, BMI 47.5. On 08/06/21, PCP started hydralazine 25 mg BID and increased metformin. Recently seen by Dr. Ali Lowe 08/12/21 for CP, who ordered a coronary CTA and ECHO to evaluate chest pain. 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 At this visit, patient was also started on Plavix and Jardiance, simvastatin was switched to atorvastatin 40 mg and amlodipine was discontinued due to LE edema. Dr. Ali Lowe planned to have pt's BP checked in 2 weeks following those changes.  At initial visit with PharmD, hydralazine was stopped since patient was only taking once a day. Spironolactone 61m daily was started. Patient was also started on Mounjaro for DM and weight loss. Metformin was stopped due to diarrhea.   Patient presents today to clinic. She was recently in the hospital for diffuse body pains. They think she might have RA. [facility] She was restarted on amlodipine 168mdaily. No swelling. Still taking spironolactone. BMP pending. Denies dizziness, lightheadedness, headache, blurred vision, SOB or swelling. Pain has improved. Now with just stiffness and some itching on palms of hands. AM blood sugars are in the 200's. She was started on lantus in the hospital. Has not checked BP at home because she does not have a cuff. Was going to give her one from the patient care fund, but when comparing it to clinic reading, it was not accurate. Will order a new one for patient. Resumed walking a few days ago. Going back to work today since her  hospitalization, this is where she does most of her walking on her breaks.  Baseline weight/BMI: 331 lbs, BMI 47.5  Diet: eating less fatty meats, more fiber,cut out fried foods, no rice potatoes bread - feels much better  -Breakfast: bowl of eggs, coffee, raisin bran with milk  -Lunch:baked chicken, veggies -Dinner:sometimes goes out gets a vegetable, when goes out goes to home cooked restaurants and gets a protein and veggie, does not eat [employer], burger king  -Drinks: mainly water, apple juice (once a day)   Exercise: walks at work for 30 mins to an hour throughout the day , getting 2,000 steps in   Family History: father- high blood pressure, paternal grandmother - high blood pressure   Social History: does not smoke (quit 1994), occasionally drinks   Current HTN meds: Hyzaar 100-25 mg daily and Toprol XL 100 mg daily, spironolactone 25 mg daily, amlodipine 1034maily Previously tried: amlodipine 10 mg daily (peripheral edema 08/12/21), hydralazine (prefers to take daily medications)  Labs: 08/15/21 CMET- Na 141, K 3.5, Scr 0.92   Lab Results  Component Value Date   HGBA1C 10.3 (H) 08/15/2021    Wt Readings from Last 1 Encounters:  09/26/21 (!) 318 lb (144.2 kg)    BP Readings from Last 1 Encounters:  09/26/21 136/86   Pulse Readings from Last 1 Encounters:  09/26/21 72       Component Value Date/Time  CHOL 139 08/15/2021 0831   TRIG 138 08/15/2021 0831   HDL 39 (L) 08/15/2021 0831   CHOLHDL 3.5 08/12/2021 1114   LDLCALC 76 08/15/2021 0831    Past Medical History:  Diagnosis Date   Anemia    Anxiety    Depression    Diabetes mellitus without complication ([place])    type 2    Environmental allergies    Hypercalcemia 2014   2020--significant increase in calcium/ionized calcium with elevated PTH--hyperparathyroidism   Hypertension    Sciatica     Current Outpatient Medications on File Prior to Visit  Medication Sig Dispense Refill   acetaminophen  (TYLENOL) 500 MG tablet 2 tabs by mouth twice daily (Patient taking differently: Take 1,000 mg by mouth every 6 (six) hours as needed for moderate pain.) 30 tablet 0   amLODipine (NORVASC) 10 MG tablet Take 1 tablet (10 mg total) by mouth daily. 30 tablet 1   atorvastatin (LIPITOR) 40 MG tablet Take 1 tablet (40 mg total) by mouth daily. 90 tablet 3   blood glucose meter kit and supplies Dispense based on patient and insurance preference. Use up to four times daily as directed. (FOR ICD-10 E10.9, E11.9). 1 each 0   Blood Glucose Monitoring Suppl (ONETOUCH VERIO) w/Device KIT Check blood sugars twice daily before meals 1 kit 0   Calcium Carb-Cholecalciferol (CALCIUM 500+D3) 500-400 MG-UNIT TABS 1 tab by mouth twice daily (Patient taking differently: Take 1 tablet by mouth 2 (two) times daily. 1 tab by mouth twice daily) 60 tablet    clopidogrel (PLAVIX) 75 MG tablet Take 1 tablet (75 mg total) by mouth daily. 90 tablet 3   cyclobenzaprine (FLEXERIL) 10 MG tablet 1/2 to 1 tab by mouth twice daily as needed for muscle pain and spasm 30 tablet 1   empagliflozin (JARDIANCE) 10 MG TABS tablet Take 1 tablet (10 mg total) by mouth daily before breakfast. Follow up with your cardiologist 30 tablet 11   EPINEPHrine (EPIPEN 2-PAK) 0.3 mg/0.3 mL IJ SOAJ injection Inject 0.3 mLs (0.3 mg total) into the muscle as needed for anaphylaxis. (Patient not taking: Reported on 09/23/2021) 1 each 1   glipiZIDE (GLUCOTROL XL) 10 MG 24 hr tablet Take 1 tablet by mouth once daily with breakfast 90 tablet 1   glucose blood (ONETOUCH VERIO) test strip Check blood glucose twice daily before meals 300 each 3   insulin glargine (LANTUS SOLOSTAR) 100 UNIT/ML Solostar Pen 25 units injected subcutaneously before breakfast in morning. 15 mL 11   Insulin Pen Needle 31G X 5 MM MISC 1 each by Does not apply route daily. 100 each 0   Lancets (ONETOUCH ULTRASOFT) lancets Check blood sugar twice daily before meals 300 each 3    losartan-hydrochlorothiazide (HYZAAR) 100-25 MG tablet Take 1 tablet by mouth once daily 30 tablet 11   metoprolol succinate (TOPROL-XL) 100 MG 24 hr tablet TAKE 1 TABLET BY MOUTH ONCE DAILY (TAKE  WITH  OR  IMMEDIATELY  FOLLOWING  A  MEAL) 90 tablet 2   nitroGLYCERIN (NITROSTAT) 0.4 MG SL tablet 1 tab under tongue as needed for Chest discomfort and repeat every 5 minutes x 2 if discomfort not relieved 25 tablet 2   oxyCODONE (OXY IR/ROXICODONE) 5 MG immediate release tablet 1 tab by mouth twice daily as needed for moderate to severe pain 15 tablet 0   predniSONE (DELTASONE) 10 MG tablet Take 1 tablet (10 mg total) by mouth daily. Take 4 pills daily for  2 days then 2  pills daily for 2 days then 1 pill daily for 2 days then stop (Patient not taking: Reported on 09/23/2021) 14 tablet 0   spironolactone (ALDACTONE) 25 MG tablet Take 1 tablet (25 mg total) by mouth daily. 30 tablet 5   No current facility-administered medications on file prior to visit.    Allergies  Allergen Reactions   Aspirin Anaphylaxis   Ibuprofen Anaphylaxis   Amlodipine     LE edema   Ace Inhibitors Cough     Assessment/Plan:  1. Weight loss/DM- A1C of 10.3% is above goal of <7%. Pt plans to start Mounjaro 2.40m on Wed. I asked her to continue to monitor her blood sugars. Patient to call if she experiences low (<80) blood sugars. However I think this is unlikely on the starting dose. Will call patient in 3-4 to follow up. Follow up in 3 months in person.   2. Hypertension - BP at goal in clinic today. Continue losartan/HCTZ 100/276mdaily, metoprolol succinate 10065maily, spironolactone 92m64mily and amlodipine 10mg28mly. I will call her when new blood pressure cuffs come in. Follow up BMP results.  MelisRamond Dialrm.D, BCPS, CPP [id number]: [phone]: [phone]/2023 9:19 AM

## 2021-10-10 NOTE — Patient Instructions (Addendum)
Continue losartan/HCTZ 100/25mg  daily, metoprolol succinate 100mg  daily, spironolactone 25mg  daily and amlodipine 10mg  daily. Continue walking and increase as much as you can.  Start Mounjaro 2.5mg  weekly. Checking fasting blood sugars. Fasting blood sugar should be 80-130. Please call me if your blood sugars are <80.

## 2021-10-14 ENCOUNTER — Telehealth: Payer: Self-pay | Admitting: Pharmacist

## 2021-10-14 MED ORDER — EZETIMIBE 10 MG PO TABS: 10.0000 mg | ORAL_TABLET | Freq: Every day | ORAL | 3 refills | Status: DC

## 2021-10-14 NOTE — Telephone Encounter (Signed)
Tried to call patient to review labs LDL-C is above goal of <70 due to DM and HTN. Would recommend adding zetia. BMP stable after adding spironolactone. No answer and VM full.  Mychart message sent

## 2021-10-14 NOTE — Telephone Encounter (Signed)
Patient willing to start ezetimibe. Will recheck lipids at apt with pharmD in dec Rx sent to pharmacy.

## 2021-10-15 ENCOUNTER — Telehealth: Payer: Self-pay | Admitting: Internal Medicine

## 2021-10-15 MED ORDER — EZETIMIBE 10 MG PO TABS: 10.0000 mg | ORAL_TABLET | Freq: Every day | ORAL | 3 refills | Status: DC

## 2021-10-15 NOTE — Telephone Encounter (Signed)
Pt c/o medication issue:  1. Name of Medication: ezetimibe (ZETIA) 10 MG tablet  2. How are you currently taking this medication (dosage and times per day)?   3. Are you having a reaction (difficulty breathing--STAT)?   4. What is your medication issue? Pt states that this medication was sent to wrong pharmacy yesterday and needs it sent to: [facility] ([place]), [place] [facility] [place]

## 2021-10-27 ENCOUNTER — Telehealth: Payer: Self-pay

## 2021-10-27 NOTE — Telephone Encounter (Signed)
Patient would like pain medication to help with her left body pain. Patient no longer has any oxycodone, but would like any medication that can help with her pain.

## 2021-11-05 ENCOUNTER — Other Ambulatory Visit: Payer: Self-pay | Admitting: Internal Medicine

## 2021-11-05 NOTE — Telephone Encounter (Signed)
Need more specifics on where pain is most affecting her and when her appt with Rheum at [facility] is.   Needs to cancel visits with [facility].

## 2021-11-05 NOTE — Telephone Encounter (Signed)
I called patient to get more information. Patient is specifically having pain in right knee, both hands and both shoulders. Knee feels like a pulsing pain. Shoulders and hands feel like a sore pain. Pain is constant, but worse in the mornings. Patients rheumatology appointment is November 15th at [facility].

## 2021-11-06 ENCOUNTER — Telehealth: Payer: Self-pay | Admitting: Pharmacist

## 2021-11-06 DIAGNOSIS — E119 Type 2 diabetes mellitus without complications: Secondary | ICD-10-CM

## 2021-11-06 MED ORDER — MOUNJARO 5 MG/0.5ML ~~LOC~~ SOAJ: 5.0000 mg | SUBCUTANEOUS | 0 refills | Status: DC

## 2021-11-06 NOTE — Telephone Encounter (Signed)
Patient used her last Mounjaro pen today. Doing well, is ok with increasing to 5mg . Will follow up with patient in another 3-4 weeks.

## 2021-11-11 MED ORDER — TRAMADOL HCL 50 MG PO TABS: ORAL_TABLET | ORAL | 0 refills | Status: DC

## 2021-11-11 MED ORDER — CYCLOBENZAPRINE HCL 10 MG PO TABS: ORAL_TABLET | ORAL | 1 refills | Status: DC

## 2021-11-17 ENCOUNTER — Other Ambulatory Visit: Payer: Self-pay | Admitting: Internal Medicine

## 2021-11-30 ENCOUNTER — Other Ambulatory Visit: Payer: Self-pay | Admitting: Internal Medicine

## 2021-11-30 DIAGNOSIS — E119 Type 2 diabetes mellitus without complications: Secondary | ICD-10-CM

## 2021-12-01 ENCOUNTER — Other Ambulatory Visit: Payer: Self-pay | Admitting: Internal Medicine

## 2021-12-01 DIAGNOSIS — E119 Type 2 diabetes mellitus without complications: Secondary | ICD-10-CM

## 2021-12-01 NOTE — Telephone Encounter (Signed)
Spoke with patient fasting BG is 80-90. This is after decreasing lantus to 20 units. She was having BG <70 when she was on 25 units but now good at 20units. Her post meal blood sugars are 110-115. She would like to stay on Mounjaro 5mg  for 1 more month because she is going out of town. She is also on glipizide xl 10mg . This will need to be decreased or stopped when we increase Mounjaro next month.

## 2021-12-03 ENCOUNTER — Ambulatory Visit: Payer: Commercial Managed Care - HMO | Admitting: Internal Medicine

## 2021-12-04 ENCOUNTER — Ambulatory Visit (INDEPENDENT_AMBULATORY_CARE_PROVIDER_SITE_OTHER): Payer: Commercial Managed Care - HMO | Admitting: Internal Medicine

## 2021-12-04 VITALS — BP 120/76 | HR 72 | Resp 16 | Ht 70.0 in | Wt 326.0 lb

## 2021-12-04 DIAGNOSIS — I10 Essential (primary) hypertension: Secondary | ICD-10-CM

## 2021-12-04 DIAGNOSIS — E1165 Type 2 diabetes mellitus with hyperglycemia: Secondary | ICD-10-CM | POA: Diagnosis not present

## 2021-12-04 DIAGNOSIS — Z6841 Body Mass Index (BMI) 40.0 and over, adult: Secondary | ICD-10-CM

## 2021-12-04 DIAGNOSIS — Z794 Long term (current) use of insulin: Secondary | ICD-10-CM

## 2021-12-04 DIAGNOSIS — M130 Polyarthritis, unspecified: Secondary | ICD-10-CM

## 2021-12-04 DIAGNOSIS — E1100 Type 2 diabetes mellitus with hyperosmolarity without nonketotic hyperglycemic-hyperosmolar coma (NKHHC): Secondary | ICD-10-CM

## 2021-12-04 NOTE — Patient Instructions (Signed)
[facility] [address], [place] [address]  [phone] Hours of Operation Mondays to Thursdays: 8 am to 8 pm Fridays: 9 am to 8 pm Saturdays: 9 am to 1 pm Sundays: Closed  [facility] Address: [address] [place], [place], [place] [address] Hours:  Open ? Closes 8?PM Phone: [phone]  [facility] Address: [address], [place], [place] [address] Hours:  Open ? Closes 8?PM Phone: [phone]

## 2021-12-04 NOTE — Progress Notes (Signed)
Subjective:    Patient ID: Rebecca Avila, female   DOB: Dec 15, 1965, 56 y.o.   MRN: [id number]   HPI   Hypertension:  Continues on Amlodipine without recurrence of LE edema, Spironolactone, Losartan/HCTZ, and Metoprolol.  She did obtain an new BP monitor.  She did not bring in today to compare.  Getting 134/77 range at home when checks at any time.    2.  DM:   Will be starting her second month of Mounjaro of her weekly 5 mg dosing.  She elected to stay on current dose as going to help out her daughter and newborn grandson tomorrow for a week.   Sugars fasting running 105 range, but as low as 88.  When checks glucose in afternoon before lunch, 80-90, 2 hour pp will be 120 range.  Glucose in evening before dinner, just under 120.   Her weight is back up 8 lbs to 326 from her visits in September.  States she has not been walking due to cold.    3.  Soft tissue inflammation/arthralgias:  still not clear as to cause.  She does not have the pain she had previously.  Does have morning stiffness for about 45 minutes in hands and anterior high chest.  Has not had recurrence of redness or swelling of dorsal hands, wrists and forearms.  See documentation of history and labs from last visit.  Question as to whether Hydralazine may have been the culprit of onset.  ANCA testing was negative.  Hydralazine stopped with hospitalization in August.     Current Meds  Medication Sig   acetaminophen (TYLENOL) 500 MG tablet 2 tabs by mouth twice daily (Patient taking differently: Take 1,000 mg by mouth every 6 (six) hours as needed for moderate pain.)   amLODipine (NORVASC) 10 MG tablet Take 1 tablet (10 mg total) by mouth daily.   atorvastatin (LIPITOR) 40 MG tablet Take 1 tablet (40 mg total) by mouth daily.   blood glucose meter kit and supplies Dispense based on patient and insurance preference. Use up to four times daily as directed. (FOR ICD-10 E10.9, E11.9).   Blood Glucose Monitoring Suppl (ONETOUCH  VERIO) w/Device KIT Check blood sugars twice daily before meals   Calcium Carb-Cholecalciferol (CALCIUM 500+D3) 500-400 MG-UNIT TABS 1 tab by mouth twice daily (Patient taking differently: Take 1 tablet by mouth 2 (two) times daily. 1 tab by mouth twice daily)   clopidogrel (PLAVIX) 75 MG tablet Take 1 tablet (75 mg total) by mouth daily.   cyclobenzaprine (FLEXERIL) 10 MG tablet 1/2 to 1 tab by mouth twice daily as needed for muscle pain and spasm   empagliflozin (JARDIANCE) 10 MG TABS tablet Take 1 tablet (10 mg total) by mouth daily before breakfast. Follow up with your cardiologist   EPINEPHrine (EPIPEN 2-PAK) 0.3 mg/0.3 mL IJ SOAJ injection Inject 0.3 mLs (0.3 mg total) into the muscle as needed for anaphylaxis.   ezetimibe (ZETIA) 10 MG tablet Take 1 tablet (10 mg total) by mouth daily.   glipiZIDE (GLUCOTROL XL) 10 MG 24 hr tablet Take 1 tablet by mouth once daily with breakfast   glucose blood (ONETOUCH VERIO) test strip Check blood glucose twice daily before meals   insulin glargine (LANTUS SOLOSTAR) 100 UNIT/ML Solostar Pen 25 units injected subcutaneously before breakfast in morning. (Patient taking differently: 20 units injected subcutaneously before breakfast in morning.)   Insulin Pen Needle 31G X 5 MM MISC 1 each by Does not apply route daily.  Lancets (ONETOUCH ULTRASOFT) lancets Check blood sugar twice daily before meals   losartan-hydrochlorothiazide (HYZAAR) 100-25 MG tablet Take 1 tablet by mouth once daily   metoprolol succinate (TOPROL-XL) 100 MG 24 hr tablet TAKE 1 TABLET BY MOUTH ONCE DAILY (TAKE  WITH  OR  IMMEDIATELY  FOLLOWING  A  MEAL)   nitroGLYCERIN (NITROSTAT) 0.4 MG SL tablet 1 tab under tongue as needed for Chest discomfort and repeat every 5 minutes x 2 if discomfort not relieved   spironolactone (ALDACTONE) 25 MG tablet Take 1 tablet (25 mg total) by mouth daily.   tirzepatide (MOUNJARO) 5 MG/0.5ML Pen INJECT 1/2 (ONE-HALF) ML  ONCE A WEEK   Allergies  Allergen  Reactions   Aspirin Anaphylaxis   Ibuprofen Anaphylaxis   Amlodipine     LE edema   Ace Inhibitors Cough     Review of Systems    Objective:   BP 120/76 (BP Location: Right Arm, Patient Position: Sitting, Cuff Size: Normal)   Pulse 72   Resp 16   Ht 5\' 10"  (1.778 m)   Wt (!) 326 lb (147.9 kg)   BMI 46.78 kg/m   Physical Exam NAD Lungs:  CTA CV:  RRR without murmur or rub.  Radial and DP pulses normal and equal Abd:  S, NT, No HSM or mass, + BS MS:  No joint swelling/effusion or erythema.  Full ROM of all joints   Assessment & Plan    Hypertension:  controlled.  2.  DM/obesity:  Decrease glipizide to 5 mg daily.  Maintain other meds.  Continue Mounjaro at 5mg  weekly until back in town.  3.  Polyarthritis:  appears to have resolved.  No clear etiology.

## 2021-12-11 ENCOUNTER — Other Ambulatory Visit: Payer: Self-pay

## 2021-12-11 MED ORDER — INSULIN PEN NEEDLE 31G X 5 MM MISC: 1.0000 | Freq: Every day | 0 refills | Status: DC

## 2021-12-23 ENCOUNTER — Ambulatory Visit (INDEPENDENT_AMBULATORY_CARE_PROVIDER_SITE_OTHER): Payer: Commercial Managed Care - HMO | Admitting: Internal Medicine

## 2021-12-23 DIAGNOSIS — E1165 Type 2 diabetes mellitus with hyperglycemia: Secondary | ICD-10-CM

## 2021-12-23 DIAGNOSIS — E119 Type 2 diabetes mellitus without complications: Secondary | ICD-10-CM

## 2021-12-23 DIAGNOSIS — M130 Polyarthritis, unspecified: Secondary | ICD-10-CM

## 2021-12-23 DIAGNOSIS — Z6841 Body Mass Index (BMI) 40.0 and over, adult: Secondary | ICD-10-CM

## 2021-12-23 DIAGNOSIS — Z794 Long term (current) use of insulin: Secondary | ICD-10-CM

## 2021-12-23 MED ORDER — MOUNJARO 5 MG/0.5ML ~~LOC~~ SOAJ: 7.5000 mg | SUBCUTANEOUS | 1 refills | Status: DC

## 2021-12-23 NOTE — Progress Notes (Signed)
Subjective:    Patient ID: Rebecca Avila, female   DOB: 1965-04-14, 56 y.o.   MRN: [id number]   HPI  Telephone visit:    Enjoyed being with her new grandchild recently   DM:  Decreased Glipizide last visit to 5 mg daily.  Blood sugars generally in 80-90s fasting, before a meal or 1 hour post prandial.  She continues on the 5 mg weekly of Mounjaro  2.  Polyarthritis:  Saw Rheum on the 16th.  Uric acid rechecked and slightly high still, though trending down (different labs).  Her symptoms with elevated inflammatory markers, and mildly + RF with negative CCP, ANA, ANCA felt to be likely viral in origin and hopefully will not recur.  Greater trochanteric bursitis treatment given.    Current Meds  Medication Sig   acetaminophen (TYLENOL) 500 MG tablet 2 tabs by mouth twice daily (Patient taking differently: Take 1,000 mg by mouth every 6 (six) hours as needed for moderate pain.)   amLODipine (NORVASC) 10 MG tablet Take 1 tablet (10 mg total) by mouth daily.   atorvastatin (LIPITOR) 40 MG tablet Take 1 tablet (40 mg total) by mouth daily.   blood glucose meter kit and supplies Dispense based on patient and insurance preference. Use up to four times daily as directed. (FOR ICD-10 E10.9, E11.9).   Blood Glucose Monitoring Suppl (ONETOUCH VERIO) w/Device KIT Check blood sugars twice daily before meals   Calcium Carb-Cholecalciferol (CALCIUM 500+D3) 500-400 MG-UNIT TABS 1 tab by mouth twice daily (Patient taking differently: Take 1 tablet by mouth 2 (two) times daily. 1 tab by mouth twice daily)   clopidogrel (PLAVIX) 75 MG tablet Take 1 tablet (75 mg total) by mouth daily.   cyclobenzaprine (FLEXERIL) 10 MG tablet 1/2 to 1 tab by mouth twice daily as needed for muscle pain and spasm   empagliflozin (JARDIANCE) 10 MG TABS tablet Take 1 tablet (10 mg total) by mouth daily before breakfast. Follow up with your cardiologist   ezetimibe (ZETIA) 10 MG tablet Take 1 tablet (10 mg total) by mouth daily.    glipiZIDE (GLUCOTROL XL) 10 MG 24 hr tablet Take 1 tablet by mouth once daily with breakfast   glucose blood (ONETOUCH VERIO) test strip Check blood glucose twice daily before meals   insulin glargine (LANTUS SOLOSTAR) 100 UNIT/ML Solostar Pen 25 units injected subcutaneously before breakfast in morning. (Patient taking differently: 20 units injected subcutaneously before breakfast in morning.)   Insulin Pen Needle 31G X 5 MM MISC 1 each by Does not apply route daily.   Lancets (ONETOUCH ULTRASOFT) lancets Check blood sugar twice daily before meals   losartan-hydrochlorothiazide (HYZAAR) 100-25 MG tablet Take 1 tablet by mouth once daily   metoprolol succinate (TOPROL-XL) 100 MG 24 hr tablet TAKE 1 TABLET BY MOUTH ONCE DAILY (TAKE  WITH  OR  IMMEDIATELY  FOLLOWING  A  MEAL)   spironolactone (ALDACTONE) 25 MG tablet Take 1 tablet (25 mg total) by mouth daily.   tirzepatide (MOUNJARO) 5 MG/0.5ML Pen INJECT 1/2 (ONE-HALF) ML  ONCE A WEEK   Allergies  Allergen Reactions   Aspirin Anaphylaxis   Ibuprofen Anaphylaxis   Ace Inhibitors Cough     Review of Systems    Objective:   There were no vitals taken for this visit.  Physical Exam No exam today--telephonic   Assessment & Plan    Obesity and DM:  sugars well controlled on Mounjaro 5 mg weekly for past 2 weeks.  No weight  loss, but admits with holidays, may not be eating as well and not as physically active.   Will stop her Glucotrol XL now.  She will continue to monitor sugars.   Plan to increase Mounjaro to 7.5 mg weekly in 2 more weeks, but discussed ok to increase sooner if sugars start to climb.   We both would like to see her weight start dropping again, but also needs to work on lifestyle changes.    2.  Poly arthritis:  felt to likely be viral etiology with hope for no recurrence.

## 2021-12-29 ENCOUNTER — Other Ambulatory Visit: Payer: Self-pay | Admitting: Internal Medicine

## 2021-12-29 DIAGNOSIS — E119 Type 2 diabetes mellitus without complications: Secondary | ICD-10-CM

## 2021-12-31 ENCOUNTER — Ambulatory Visit (HOSPITAL_BASED_OUTPATIENT_CLINIC_OR_DEPARTMENT_OTHER): Payer: No Typology Code available for payment source | Admitting: Internal Medicine

## 2022-01-02 ENCOUNTER — Encounter (HOSPITAL_BASED_OUTPATIENT_CLINIC_OR_DEPARTMENT_OTHER): Payer: Self-pay | Admitting: Emergency Medicine

## 2022-01-02 ENCOUNTER — Emergency Department (HOSPITAL_BASED_OUTPATIENT_CLINIC_OR_DEPARTMENT_OTHER)
Admission: EM | Admit: 2022-01-02 | Discharge: 2022-01-02 | Disposition: A | Discharge status: Home / Self Care | Payer: 59 | Attending: Emergency Medicine | Admitting: Emergency Medicine

## 2022-01-02 DIAGNOSIS — Z7984 Long term (current) use of oral hypoglycemic drugs: Secondary | ICD-10-CM | POA: Insufficient documentation

## 2022-01-02 DIAGNOSIS — Z7902 Long term (current) use of antithrombotics/antiplatelets: Secondary | ICD-10-CM | POA: Insufficient documentation

## 2022-01-02 DIAGNOSIS — R110 Nausea: Secondary | ICD-10-CM | POA: Diagnosis not present

## 2022-01-02 DIAGNOSIS — E119 Type 2 diabetes mellitus without complications: Secondary | ICD-10-CM | POA: Diagnosis not present

## 2022-01-02 DIAGNOSIS — Z1152 Encounter for screening for COVID-19: Secondary | ICD-10-CM | POA: Diagnosis not present

## 2022-01-02 DIAGNOSIS — Z79899 Other long term (current) drug therapy: Secondary | ICD-10-CM | POA: Diagnosis not present

## 2022-01-02 DIAGNOSIS — R519 Headache, unspecified: Secondary | ICD-10-CM | POA: Insufficient documentation

## 2022-01-02 DIAGNOSIS — E860 Dehydration: Secondary | ICD-10-CM | POA: Insufficient documentation

## 2022-01-02 DIAGNOSIS — Z794 Long term (current) use of insulin: Secondary | ICD-10-CM | POA: Insufficient documentation

## 2022-01-02 DIAGNOSIS — I10 Essential (primary) hypertension: Secondary | ICD-10-CM | POA: Insufficient documentation

## 2022-01-02 DIAGNOSIS — R531 Weakness: Secondary | ICD-10-CM | POA: Diagnosis present

## 2022-01-02 DIAGNOSIS — R6889 Other general symptoms and signs: Secondary | ICD-10-CM

## 2022-01-02 LAB — CBC
HCT: 40.9 % (ref 36.0–46.0)
Hemoglobin: 12.8 g/dL (ref 12.0–15.0)
MCH: 25.8 pg — ABNORMAL LOW (ref 26.0–34.0)
MCHC: 31.3 g/dL (ref 30.0–36.0)
MCV: 82.3 fL (ref 80.0–100.0)
Platelets: 249 10*3/uL (ref 150–400)
RBC: 4.97 MIL/uL (ref 3.87–5.11)
RDW: 15.2 % (ref 11.5–15.5)
WBC: 11.9 10*3/uL — ABNORMAL HIGH (ref 4.0–10.5)
nRBC: 0 % (ref 0.0–0.2)

## 2022-01-02 LAB — RESP PANEL BY RT-PCR (FLU A&B, COVID) ARPGX2
Influenza A by PCR: NEGATIVE
Influenza B by PCR: NEGATIVE
SARS Coronavirus 2 by RT PCR: NEGATIVE

## 2022-01-02 LAB — SARS CORONAVIRUS 2 BY RT PCR: SARS Coronavirus 2 by RT PCR: NEGATIVE

## 2022-01-02 LAB — BASIC METABOLIC PANEL
Anion gap: 12 (ref 5–15)
BUN: 23 mg/dL — ABNORMAL HIGH (ref 6–20)
CO2: 26 mmol/L (ref 22–32)
Calcium: 9.7 mg/dL (ref 8.9–10.3)
Chloride: 97 mmol/L — ABNORMAL LOW (ref 98–111)
Creatinine, Ser: 1.66 mg/dL — ABNORMAL HIGH (ref 0.44–1.00)
GFR, Estimated: 36 mL/min — ABNORMAL LOW (ref >60)
Glucose, Bld: 162 mg/dL — ABNORMAL HIGH (ref 70–99)
Potassium: 3.8 mmol/L (ref 3.5–5.1)
Sodium: 135 mmol/L (ref 135–145)

## 2022-01-02 LAB — CBG MONITORING, ED: Glucose-Capillary: 155 mg/dL — ABNORMAL HIGH (ref 70–99)

## 2022-01-02 MED ORDER — ONDANSETRON 8 MG PO TBDP: 8.0000 mg | ORAL_TABLET | Freq: Three times a day (TID) | ORAL | 0 refills | Status: DC | PRN

## 2022-01-02 NOTE — ED Notes (Signed)
Pt encouraged to provide urine specimen per MD order. Specimen cup provided.

## 2022-01-02 NOTE — ED Notes (Signed)
Pt d/c home with husband per MD order. Discharge summary reviewed, pt verbalizes understanding. Ambulatory off unit. No s/s of acute distress noted at discharge.

## 2022-01-02 NOTE — ED Triage Notes (Signed)
Pt here from with c/o weakness and n/v along with chills and body aches , no fever on arrival

## 2022-01-02 NOTE — ED Provider Notes (Signed)
[facility] EMER[facility]ENC[facility] DEPT Provider Note   CSN: [id number] Arrival date & time: 01/02/22  2202     History  Chief Complaint  Patient presents with   Weakness    Rebecca Avila is a 56 y.o. female.  HPI    56 year old patient with history of diabetes, hypertension comes in with chief complaint of generalized weakness, nausea, fevers, chills.  Patient has been feeling unwell for the last 3 days.  Initially she had body aches, chills and fevers, now she is also having nausea and generalized weakness.  Reduced p.o. intake.  Patient continues to try and drink orange juice and water.  She denies any new cough, wheezing or congestion.  Review of system is negative for abdominal pain, chest pain, severe headaches or neck pain, UTI-like symptoms.  Home Medications Prior to Admission medications   Medication Sig Start Date End Date Taking? Authorizing Provider  ondansetron (ZOFRAN-ODT) 8 MG disintegrating tablet Take 1 tablet (8 mg total) by mouth every 8 (eight) hours as needed for nausea. 01/02/22  Yes Varney Biles, MD  acetaminophen (TYLENOL) 500 MG tablet 2 tabs by mouth twice daily Patient taking differently: Take 1,000 mg by mouth every 6 (six) hours as needed for moderate pain. 01/25/19   Mack Hook, MD  amLODipine (NORVASC) 10 MG tablet Take 1 tablet (10 mg total) by mouth daily. 09/18/21   Shelly Coss, MD  atorvastatin (LIPITOR) 40 MG tablet Take 1 tablet (40 mg total) by mouth daily. 08/15/21   Early Osmond, MD  blood glucose meter kit and supplies Dispense based on patient and insurance preference. Use up to four times daily as directed. (FOR ICD-10 E10.9, E11.9). 09/17/21   Shelly Coss, MD  Blood Glucose Monitoring Suppl (ONETOUCH VERIO) w/Device KIT Check blood sugars twice daily before meals 11/06/19   Mack Hook, MD  Calcium Carb-Cholecalciferol (CALCIUM 500+D3) 500-400 MG-UNIT TABS 1 tab by mouth twice daily Patient taking differently:  Take 1 tablet by mouth 2 (two) times daily. 1 tab by mouth twice daily 07/03/20   Mack Hook, MD  clopidogrel (PLAVIX) 75 MG tablet Take 1 tablet (75 mg total) by mouth daily. 08/12/21   Early Osmond, MD  cyclobenzaprine (FLEXERIL) 10 MG tablet 1/2 to 1 tab by mouth twice daily as needed for muscle pain and spasm 11/11/21   Mack Hook, MD  empagliflozin (JARDIANCE) 10 MG TABS tablet Take 1 tablet (10 mg total) by mouth daily before breakfast. Follow up with your cardiologist 09/17/21   Shelly Coss, MD  EPINEPHrine (EPIPEN 2-PAK) 0.3 mg/0.3 mL IJ SOAJ injection Inject 0.3 mLs (0.3 mg total) into the muscle as needed for anaphylaxis. Patient not taking: Reported on 12/23/2021 02/01/19   Mack Hook, MD  ezetimibe (ZETIA) 10 MG tablet Take 1 tablet (10 mg total) by mouth daily. 10/15/21   Early Osmond, MD  glipiZIDE (GLUCOTROL XL) 10 MG 24 hr tablet Take 1 tablet by mouth once daily with breakfast 11/18/21   Mack Hook, MD  glucose blood (ONETOUCH VERIO) test strip Check blood glucose twice daily before meals 04/18/20   Mack Hook, MD  insulin glargine (LANTUS SOLOSTAR) 100 UNIT/ML Solostar Pen 25 units injected subcutaneously before breakfast in morning. Patient taking differently: 20 units injected subcutaneously before breakfast in morning. 09/26/21   Mack Hook, MD  Insulin Pen Needle 31G X 5 MM MISC 1 each by Does not apply route daily. 12/11/21   Mack Hook, MD  Lancets [facility] ULTRASOFT) lancets Check blood sugar twice  daily before meals 04/18/20   Mack Hook, MD  losartan-hydrochlorothiazide [facility]) 100-25 MG tablet Take 1 tablet by mouth once daily 08/08/21   Mack Hook, MD  metoprolol succinate (TOPROL-XL) 100 MG 24 hr tablet TAKE 1 TABLET BY MOUTH ONCE DAILY (TAKE  WITH  OR  IMMEDIATELY  FOLLOWING  A  MEAL) 09/25/21   Mack Hook, MD  nitroGLYCERIN (NITROSTAT) 0.4 MG SL tablet 1 tab under tongue as needed  for Chest discomfort and repeat every 5 minutes x 2 if discomfort not relieved Patient not taking: Reported on 12/23/2021 08/06/21   Mack Hook, MD  spironolactone (ALDACTONE) 25 MG tablet Take 1 tablet (25 mg total) by mouth daily. 09/05/21   Early Osmond, MD  tirzepatide [facility]) 5 MG/0.5ML Pen Inject 7.5 mg into the skin once a week. 12/23/21   Mack Hook, MD      Allergies    Aspirin, Ibuprofen, and Ace inhibitors    Review of Systems   Review of Systems  Physical Exam Updated Vital Signs BP 131/83   Pulse 80   Temp 98.3 F (36.8 C)   Resp 18   Ht _0  (1.778 m)   SpO2 94%   BMI 46.78 kg/m  Physical Exam Vitals and nursing note reviewed.  Constitutional:      Appearance: She is well-developed.  HENT:     Head: Normocephalic and atraumatic.  Eyes:     Extraocular Movements: Extraocular movements intact.  Cardiovascular:     Rate and Rhythm: Normal rate.  Pulmonary:     Effort: Pulmonary effort is normal.  Musculoskeletal:     Cervical back: Normal range of motion and neck supple.  Skin:    General: Skin is dry.  Neurological:     Mental Status: She is alert and oriented to person, place, and time.     ED Results / Procedures / Treatments   Labs (all labs ordered are listed, but only abnormal results are displayed) Labs Reviewed  BASIC METABOLIC PANEL - Abnormal; Notable for the following components:      Result Value   Chloride 97 (*)    Glucose, Bld 162 (*)    BUN 23 (*)    Creatinine, Ser 1.66 (*)    GFR, Estimated 36 (*)    All other components within normal limits  CBC - Abnormal; Notable for the following components:   WBC 11.9 (*)    MCH 25.8 (*)    All other components within normal limits  CBG MONITORING, ED - Abnormal; Notable for the following components:   Glucose-Capillary 155 (*)    All other components within normal limits  SARS CORONAVIRUS 2 BY RT PCR  RESP PANEL BY RT-PCR (FLU A&B, COVID) [id number]  URINALYSIS,  ROUTINE W REFLEX MICROSCOPIC    EKG None  Radiology No results found.  Procedures Procedures    Medications Ordered in ED Medications - No data to display  ED Course/ Medical Decision Making/ A&P                           Medical Decision Making 56 year old patient with history of diabetes, hypertension comes in with chief complaint of flulike symptoms.  She is having bodyaches, nausea, fevers, subjective chills and mild headaches.  Patient is immunocompetent.  She is nontoxic.  She appears slightly dry.  Denies any orthostasis.  No GI symptoms.  Overall the exam is reassuring.  Vital signs are also within normal limits  and reassuring.  Patient's husband also provides additional history.  He states that patient is slightly better today than yesterday.  COVID-19 test was ordered, labs interpreted independently.  Slight elevation in creatinine, COVID-19 swab is negative.  Flu test is pending at this time.  Patient is comfortable going home.  She states that she has been able to tolerate liquids.  We will give her nausea medicine as needed, and discussed with her the need for appropriate hydration and the need for follow-up with her PCP if her symptoms linger.  Return precautions to the ER also discussed.  Amount and/or Complexity of Data Reviewed Labs: ordered.  Risk Prescription drug management.    Final Clinical Impression(s) / ED Diagnoses Final diagnoses:  Flu-like symptoms  Dehydration    Rx / DC Orders ED Discharge Orders          Ordered    ondansetron (ZOFRAN-ODT) 8 MG disintegrating tablet  Every 8 hours PRN        01/02/22 1019              Varney Biles, MD 01/02/22 1022

## 2022-01-02 NOTE — Discharge Instructions (Addendum)
You were seen in the ER for symptoms that are consistent with flu or COVID-like illness.  Your COVID-19 swab is negative.  Flu results are still pending.  Irrespective of what viral condition you have, treatment is conservative and supportive.  We recommend that you ensure adequate hydration.  Take nausea medicine as needed.  Your kidney function shows slight elevation, because of likely dehydration.  We recommend that you follow-up with your primary care doctor in 1 week for repeat checkup.   Please return to the ER if your symptoms worsen; you have increased pain, fevers, chills, inability to keep any medications down, confusion. Otherwise see the outpatient doctor as requested.

## 2022-01-05 ENCOUNTER — Ambulatory Visit: Payer: 59 | Attending: Internal Medicine

## 2022-01-05 NOTE — Progress Notes (Deleted)
Patient ID: [name]                 DOB: 11/29/1965                    MRN: [id number]     HPI: Rebecca Avila is a 56 y.o. female patient referred to pharmacy clinic by Dr. Ali Avila to initiate weight loss therapy with GLP1-RA. PMH is significant for obesity complicated by chronic medical conditions including T2DM (on metformin, glipizide and Jardiance), HTN, HLD, CKDII, and anxiety. Baseline weight 331 lbs, BMI 47.5. On 08/06/21, PCP started hydralazine 25 mg BID and increased metformin. Recently seen by Dr. Ali Avila 08/12/21 for CP, who ordered a coronary CTA and ECHO to evaluate chest pain. 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 At this visit, patient was also started on Plavix and Jardiance, simvastatin was switched to atorvastatin 40 mg and amlodipine was discontinued due to LE edema. Dr. Ali Avila planned to have pt's BP checked in 2 weeks following those changes.  At initial visit with PharmD, hydralazine was stopped since patient was only taking once a day. Spironolactone 69m daily was started. Patient was also started on Mounjaro for DM and weight loss. Metformin was stopped due to diarrhea.   Patient presents today to clinic. She was recently in the hospital for diffuse body pains. They think she might have RA. [facility] She was restarted on amlodipine 121mdaily. No swelling. Still taking spironolactone. BMP pending. Denies dizziness, lightheadedness, headache, blurred vision, SOB or swelling. Pain has improved. Now with just stiffness and some itching on palms of hands. AM blood sugars are in the 200's. She was started on lantus in the hospital. Has not checked BP at home because she does not have a cuff. Was going to give her one from the patient care fund, but when comparing it to clinic reading, it was not accurate. Will order a new one for patient. Resumed walking a few days ago. Going back to work today since her  hospitalization, this is where she does most of her walking on her breaks.  Baseline weight/BMI: 331 lbs, BMI 47.5  Diet: eating less fatty meats, more fiber,cut out fried foods, no rice potatoes bread - feels much better  -Breakfast: bowl of eggs, coffee, raisin bran with milk  -Lunch:baked chicken, veggies -Dinner:sometimes goes out gets a vegetable, when goes out goes to home cooked restaurants and gets a protein and veggie, does not eat [employer], burger [employer]  -Drinks: mainly water, apple juice (once a day)   Exercise: walks at work for 30 mins to an hour throughout the day , getting 2,000 steps in   Family History: father- high blood pressure, paternal grandmother - high blood pressure   Social History: does not smoke (quit 1994), occasionally drinks   Current HTN meds: Hyzaar 100-25 mg daily and Toprol XL 100 mg daily, spironolactone 25 mg daily, amlodipine 1065maily Previously tried: amlodipine 10 mg daily (peripheral edema 08/12/21), hydralazine (prefers to take daily medications)  Labs: 08/15/21 CMET- Na 141, K 3.5, Scr 0.92   Lab Results  Component Value Date   HGBA1C 10.3 (H) 08/15/2021    Wt Readings from Last 1 Encounters:  12/04/21 (!) 326 lb (147.9 kg)    BP Readings from Last 1 Encounters:  01/02/22 131/83   Pulse Readings from Last 1 Encounters:  01/02/22 80       Component Value Date/Time  CHOL 144 10/10/2021 0907   TRIG 138 10/10/2021 0907   HDL 34 (L) 10/10/2021 0907   CHOLHDL 4.2 10/10/2021 0907   LDLCALC 85 10/10/2021 0907    Past Medical History:  Diagnosis Date   Anemia    Anxiety    Depression    Diabetes mellitus without complication (HCC)    type 2    Environmental allergies    Hypercalcemia 2014   2020--significant increase in calcium/ionized calcium with elevated PTH--hyperparathyroidism   Hypertension    Sciatica     Current Outpatient Medications on File Prior to Visit  Medication Sig Dispense Refill   acetaminophen  (TYLENOL) 500 MG tablet 2 tabs by mouth twice daily (Patient taking differently: Take 1,000 mg by mouth every 6 (six) hours as needed for moderate pain.) 30 tablet 0   amLODipine (NORVASC) 10 MG tablet Take 1 tablet (10 mg total) by mouth daily. 30 tablet 1   atorvastatin (LIPITOR) 40 MG tablet Take 1 tablet (40 mg total) by mouth daily. 90 tablet 3   blood glucose meter kit and supplies Dispense based on patient and insurance preference. Use up to four times daily as directed. (FOR ICD-10 E10.9, E11.9). 1 each 0   Blood Glucose Monitoring Suppl (ONETOUCH VERIO) w/Device KIT Check blood sugars twice daily before meals 1 kit 0   Calcium Carb-Cholecalciferol (CALCIUM 500+D3) 500-400 MG-UNIT TABS 1 tab by mouth twice daily (Patient taking differently: Take 1 tablet by mouth 2 (two) times daily. 1 tab by mouth twice daily) 60 tablet    clopidogrel (PLAVIX) 75 MG tablet Take 1 tablet (75 mg total) by mouth daily. 90 tablet 3   cyclobenzaprine (FLEXERIL) 10 MG tablet 1/2 to 1 tab by mouth twice daily as needed for muscle pain and spasm 60 tablet 1   empagliflozin (JARDIANCE) 10 MG TABS tablet Take 1 tablet (10 mg total) by mouth daily before breakfast. Follow up with your cardiologist 30 tablet 11   EPINEPHrine (EPIPEN 2-PAK) 0.3 mg/0.3 mL IJ SOAJ injection Inject 0.3 mLs (0.3 mg total) into the muscle as needed for anaphylaxis. (Patient not taking: Reported on 12/23/2021) 1 each 1   ezetimibe (ZETIA) 10 MG tablet Take 1 tablet (10 mg total) by mouth daily. 90 tablet 3   glipiZIDE (GLUCOTROL XL) 10 MG 24 hr tablet Take 1 tablet by mouth once daily with breakfast 90 tablet 3   glucose blood (ONETOUCH VERIO) test strip Check blood glucose twice daily before meals 300 each 3   insulin glargine (LANTUS SOLOSTAR) 100 UNIT/ML Solostar Pen 25 units injected subcutaneously before breakfast in morning. (Patient taking differently: 20 units injected subcutaneously before breakfast in morning.) 15 mL 11   Insulin Pen  Needle 31G X 5 MM MISC 1 each by Does not apply route daily. 100 each 0   Lancets (ONETOUCH ULTRASOFT) lancets Check blood sugar twice daily before meals 300 each 3   losartan-hydrochlorothiazide (HYZAAR) 100-25 MG tablet Take 1 tablet by mouth once daily 30 tablet 11   metoprolol succinate (TOPROL-XL) 100 MG 24 hr tablet TAKE 1 TABLET BY MOUTH ONCE DAILY (TAKE  WITH  OR  IMMEDIATELY  FOLLOWING  A  MEAL) 90 tablet 2   nitroGLYCERIN (NITROSTAT) 0.4 MG SL tablet 1 tab under tongue as needed for Chest discomfort and repeat every 5 minutes x 2 if discomfort not relieved (Patient not taking: Reported on 12/23/2021) 25 tablet 2   ondansetron (ZOFRAN-ODT) 8 MG disintegrating tablet Take 1 tablet (8 mg total) by mouth  every 8 (eight) hours as needed for nausea. 20 tablet 0   spironolactone (ALDACTONE) 25 MG tablet Take 1 tablet (25 mg total) by mouth daily. 30 tablet 5   tirzepatide (MOUNJARO) 5 MG/0.5ML Pen Inject 7.5 mg into the skin once a week. 4 mL 1   No current facility-administered medications on file prior to visit.    Allergies  Allergen Reactions   Aspirin Anaphylaxis   Ibuprofen Anaphylaxis   Ace Inhibitors Cough     Assessment/Plan:  1. Weight loss/DM- A1C of 10.3% is above goal of <7%. Pt plans to start Mounjaro 2.74m on Wed. I asked her to continue to monitor her blood sugars. Patient to call if she experiences low (<80) blood sugars. However I think this is unlikely on the starting dose. Will call patient in 3-4 to follow up. Follow up in 3 months in person.   2. Hypertension - BP at goal in clinic today. Continue losartan/HCTZ 100/239mdaily, metoprolol succinate 10067maily, spironolactone 5m29mily and amlodipine 10mg60mly. I will call her when new blood pressure cuffs come in. Follow up BMP results.  MelisRamond Dialrm.D, BCPS, CPP [facility] [id number]: [phone]:  [phone]/11/2021 7:49 AM

## 2022-01-07 ENCOUNTER — Other Ambulatory Visit: Payer: Self-pay

## 2022-01-07 ENCOUNTER — Other Ambulatory Visit: Payer: Self-pay | Admitting: Internal Medicine

## 2022-01-07 DIAGNOSIS — E119 Type 2 diabetes mellitus without complications: Secondary | ICD-10-CM

## 2022-01-08 NOTE — Telephone Encounter (Signed)
Call to discuss BG readings and tolerability on Mounjaro 5 mg dose, N/A LVM.  Can consider next dose -7.5 mg weekly  May need to reduce dose of glipizide XL from 10 mg to 5 mg and/or basal insulin (depends on home BG readings)

## 2022-01-21 ENCOUNTER — Other Ambulatory Visit: Payer: Self-pay

## 2022-01-21 MED ORDER — SPIRONOLACTONE 25 MG PO TABS: 25.0000 mg | ORAL_TABLET | Freq: Every day | ORAL | 1 refills | Status: DC

## 2022-02-04 NOTE — Progress Notes (Unsigned)
Cardiology Office Note:    Date:  02/05/2022   ID:  Rebecca Avila, DOB 1966-01-12, MRN [id number]  PCP:  Julieanne Manson, MD   [facility] Providers Cardiologist:  Alverda Skeans, MD Referring MD: Julieanne Manson, MD   Chief Complaint/Reason for Referral: Chest pain  ASSESSMENT:    1. Precordial pain   2. Type 2 diabetes mellitus without complication, unspecified whether long term insulin use (HCC)   3. Hypertension associated with diabetes (HCC)   4. CKD (chronic kidney disease) stage 2, GFR 60-89 ml/min   5. Hyperlipidemia associated with type 2 diabetes mellitus (HCC)   6. Body mass index (BMI) of 50.0 to 59.9 in adult (HCC)   7. Snoring      PLAN:    In order of problems listed above: 1.  Chest pain: This has resolved.  Continue to monitor. 2.  Type 2 diabetes: Continue Plavix, Jardiance, Hyzaar, and atorvastatin.   3.  Hypertension: Blood pressure is well-controlled on her current regimen. 4.  Hyperlipidemia: Will check lipid panel and LFTs today.  Given elevated LP(a) goal LDL is less than 55.  Her LDL in September was 85 was added. 5.  Chronic kidney disease: Stage II currently.  Continue Hyzaar and Jardiance.   6.  Elevated BMI: On Mounjaro. 7.  Snoring: She says her daytime somnolence and snoring has basically resolved; will defer sleep apnea evaluation.   Dispo:  Return in about 9 months (around 11/06/2022).      Medication Adjustments/Labs and Tests Ordered: Current medicines are reviewed at length with the patient today.  Concerns regarding medicines are outlined above.  The following changes have been made:    Labs/tests ordered: Orders Placed This Encounter  Procedures   Lipid panel   Hepatic function panel    Medication Changes: No orders of the defined types were placed in this encounter.    Current medicines are reviewed at length with the patient today.  The patient does not have concerns regarding medicines.   History of  Present Illness:    FOCUSED PROBLEM LIST:   1.  Type 2 diabetes on insulin 2.  Hypertension with ACE inhibitor induced cough 3.  Hyperlipidemia; LP(a) of 212 4.  BMI of 50 5.  Stage II chronic kidney disease 6.  Aspirin allergy with anaphylaxis 7.  Right bundle branch block  July 2023 consultation: The patient is a 57 y.o. female with the indicated medical history here for recommendations regarding chest pain.  Patient was seen by her primary care provider recently.  She reported chest pain since January.  It occurs in her midsternal area and comes and goes.  It can occur at rest or with walking.  She denies any dyspnea.  The patient tells me that on occasion she will get a central chest tightness.  There is no radiation.  It can happen at rest and with exertion.  She cannot specify any alleviating or exacerbating factors.  She denies any presyncope or syncope.  She denies any palpitations or paroxysmal nocturnal dyspnea.  She does have chronic lower extremity edema of her left side.  She has had no severe bleeding or bruising.  She does not smoke.  She moved from [place] about 20 years ago.  She works full-time in Audiological [employer].  Plan: Obtain coronary CTA and echocardiogram, start Plavix in lieu of aspirin due to aspirin allergy, start Jardiance, stop amlodipine, check lipid panel LP(a), refer to pharmacy for elevated BMI, and refer for sleep apnea evaluation.  Today: In the interim the patient did see pharmacy the patient was started on Mounjaro.  Metformin was ultimately stopped due to diarrhea and amlodipine restarted with no recurrence of lower extremity edema.  She was also started on Aldactone.  She was started on Zetia due to an LDL not at goal.  An echocardiogram was done which was reassuring.  However the patient did not have her coronary CTA performed.  The patient is here with her husband.  She feels very well.  She denies any shortness of breath, chest pain, paroxysmal nocturnal  dyspnea, orthopnea.  She is lost 7 pounds.  She no longer feels tired in a day.  She has not required any emergency room visits or hospitalizations for cardiovascular issues recently.  She was seen in the emergency department in December for weakness; her evaluation was reassuring and she was discharged home.   Current Medications: Current Meds  Medication Sig   acetaminophen (TYLENOL) 500 MG tablet 2 tabs by mouth twice daily (Patient taking differently: Take 1,000 mg by mouth every 6 (six) hours as needed for moderate pain.)   amLODipine (NORVASC) 10 MG tablet Take 1 tablet (10 mg total) by mouth daily.   atorvastatin (LIPITOR) 40 MG tablet Take 1 tablet (40 mg total) by mouth daily.   blood glucose meter kit and supplies Dispense based on patient and insurance preference. Use up to four times daily as directed. (FOR ICD-10 E10.9, E11.9).   Blood Glucose Monitoring Suppl (ONETOUCH VERIO) w/Device KIT Check blood sugars twice daily before meals   Calcium Carb-Cholecalciferol (CALCIUM 500+D3) 500-400 MG-UNIT TABS 1 tab by mouth twice daily (Patient taking differently: Take 1 tablet by mouth 2 (two) times daily. 1 tab by mouth twice daily)   clopidogrel (PLAVIX) 75 MG tablet Take 1 tablet (75 mg total) by mouth daily.   cyclobenzaprine (FLEXERIL) 10 MG tablet 1/2 to 1 tab by mouth twice daily as needed for muscle pain and spasm   empagliflozin (JARDIANCE) 10 MG TABS tablet Take 1 tablet (10 mg total) by mouth daily before breakfast. Follow up with your cardiologist   EPINEPHrine (EPIPEN 2-PAK) 0.3 mg/0.3 mL IJ SOAJ injection Inject 0.3 mLs (0.3 mg total) into the muscle as needed for anaphylaxis.   ezetimibe (ZETIA) 10 MG tablet Take 1 tablet (10 mg total) by mouth daily.   glucose blood (ONETOUCH VERIO) test strip Check blood glucose twice daily before meals   insulin glargine (LANTUS SOLOSTAR) 100 UNIT/ML Solostar Pen 25 units injected subcutaneously before breakfast in morning. (Patient taking  differently: 20 units injected subcutaneously before breakfast in morning.)   Insulin Pen Needle 31G X 5 MM MISC 1 each by Does not apply route daily.   Lancets (ONETOUCH ULTRASOFT) lancets Check blood sugar twice daily before meals   losartan-hydrochlorothiazide (HYZAAR) 100-25 MG tablet Take 1 tablet by mouth once daily   metoprolol succinate (TOPROL-XL) 100 MG 24 hr tablet TAKE 1 TABLET BY MOUTH ONCE DAILY (TAKE  WITH  OR  IMMEDIATELY  FOLLOWING  A  MEAL)   nitroGLYCERIN (NITROSTAT) 0.4 MG SL tablet 1 tab under tongue as needed for Chest discomfort and repeat every 5 minutes x 2 if discomfort not relieved   ondansetron (ZOFRAN-ODT) 8 MG disintegrating tablet Take 1 tablet (8 mg total) by mouth every 8 (eight) hours as needed for nausea.   spironolactone (ALDACTONE) 25 MG tablet Take 1 tablet (25 mg total) by mouth daily.   tirzepatide (MOUNJARO) 5 MG/0.5ML Pen INJECT 1/2 (ONE-HALF) ML  ONCE A WEEK     Allergies:    Aspirin, Ibuprofen, and Ace inhibitors   Social History:   Social History   Tobacco Use   Smoking status: Former    Packs/day: 0.00    Types: Cigarettes    Start date: 01/27/1992    Quit date: 03/29/2020    Years since quitting: 1.8   Smokeless tobacco: Never   Tobacco comments:    once monthly  Vaping Use   Vaping Use: Never used  Substance Use Topics   Alcohol use: Yes    Comment: occ   Drug use: Never     Family Hx: Family History  Problem Relation Age of Onset   High blood pressure Father    High blood pressure Paternal Grandmother    High blood pressure Paternal Aunt    Lung cancer Mother    Cancer Brother        congenital tumor   Diabetes Neg Hx    Thyroid disease Neg Hx      Review of Systems:   Please see the history of present illness.    All other systems reviewed and are negative.     EKGs/Labs/Other Test Reviewed:    EKG:  EKG performed July 2023 that I personally reviewed demonstrates sinus rhythm with right bundle branch block.  Prior  CV studies:  TTE 2023: 1. Marked bradycardia (43-45 bpm) limits evaluation of diastolic  function. Left ventricular ejection fraction, by estimation, is 55 to 60%.  The left ventricle has normal function. The left ventricle has no regional  wall motion abnormalities. There is  moderate concentric left ventricular hypertrophy. Left ventricular  diastolic function could not be evaluated.   2. Right ventricular systolic function is normal. The right ventricular  size is normal. There is normal pulmonary artery systolic pressure. The  estimated right ventricular systolic pressure is 35.2 mmHg.   3. Left atrial size was mildly dilated.   4. The mitral valve is normal in structure. Trivial mitral valve  regurgitation. No evidence of mitral stenosis.   5. Tricuspid valve regurgitation is mild to moderate.   6. The aortic valve is tricuspid. Aortic valve regurgitation is not  visualized. No aortic stenosis is present.   7. The inferior vena cava is dilated in size with >50% respiratory  variability, suggesting right atrial pressure of 8 mmHg.   Extremity Dopplers 2013 negative  Other studies Reviewed: Review of the additional studies/records demonstrates: None relevant  Recent Labs: 09/13/2021: B Natriuretic Peptide 70.5 10/10/2021: ALT 8 01/02/2022: BUN 23; Creatinine, Ser 1.66; Hemoglobin 12.8; Platelets 249; Potassium 3.8; Sodium 135   Recent Lipid Panel Lab Results  Component Value Date/Time   CHOL 144 10/10/2021 09:07 AM   TRIG 138 10/10/2021 09:07 AM   HDL 34 (L) 10/10/2021 09:07 AM   LDLCALC 85 10/10/2021 09:07 AM    Risk Assessment/Calculations:           Physical Exam:    VS:  BP (!) 126/58   Pulse 65   Ht 5\' 10"  (1.778 m)   Wt (!) 326 lb (147.9 kg)   SpO2 98%   BMI 46.78 kg/m    Wt Readings from Last 3 Encounters:  02/05/22 (!) 326 lb (147.9 kg)  12/04/21 (!) 326 lb (147.9 kg)  10/10/21 (!) 318 lb (144.2 kg)    GENERAL:  No apparent distress, AOx3 HEENT:  No  carotid bruits, +2 carotid impulses, no scleral icterus CAR: RRR no murmurs, gallops, rubs, or thrills  RES:  Clear to auscultation bilaterally ABD:  Soft, nontender, nondistended, positive bowel sounds x 4 VASC:  +2 radial pulses, +2 carotid pulses, palpable pedal pulses NEURO:  CN 2-12 grossly intact; motor and sensory grossly intact PSYCH:  No active depression or anxiety EXT:  No edema, ecchymosis, or cyanosis  Signed, Early Osmond, MD  02/05/2022 9:25 AM    [facility] [address], [place], [place]  [address] Phone: [phone]; Fax: [phone]   Note:  This document was prepared using Dragon voice recognition software and may include unintentional dictation errors.

## 2022-02-05 ENCOUNTER — Ambulatory Visit: Payer: 59 | Attending: Internal Medicine | Admitting: Internal Medicine

## 2022-02-05 ENCOUNTER — Encounter: Payer: Self-pay | Admitting: Internal Medicine

## 2022-02-05 VITALS — BP 126/58 | HR 65 | Ht 70.0 in | Wt 326.0 lb

## 2022-02-05 DIAGNOSIS — R072 Precordial pain: Secondary | ICD-10-CM

## 2022-02-05 DIAGNOSIS — E1159 Type 2 diabetes mellitus with other circulatory complications: Secondary | ICD-10-CM | POA: Diagnosis not present

## 2022-02-05 DIAGNOSIS — N182 Chronic kidney disease, stage 2 (mild): Secondary | ICD-10-CM

## 2022-02-05 DIAGNOSIS — E119 Type 2 diabetes mellitus without complications: Secondary | ICD-10-CM | POA: Diagnosis not present

## 2022-02-05 DIAGNOSIS — I152 Hypertension secondary to endocrine disorders: Secondary | ICD-10-CM

## 2022-02-05 DIAGNOSIS — E1169 Type 2 diabetes mellitus with other specified complication: Secondary | ICD-10-CM

## 2022-02-05 DIAGNOSIS — R0683 Snoring: Secondary | ICD-10-CM

## 2022-02-05 DIAGNOSIS — E785 Hyperlipidemia, unspecified: Secondary | ICD-10-CM

## 2022-02-05 DIAGNOSIS — Z6841 Body Mass Index (BMI) 40.0 and over, adult: Secondary | ICD-10-CM

## 2022-02-05 LAB — LIPID PANEL
Chol/HDL Ratio: 3.1 ratio (ref 0.0–4.4)
Cholesterol, Total: 116 mg/dL (ref 100–199)
HDL: 38 mg/dL — ABNORMAL LOW (ref >39)
LDL Chol Calc (NIH): 61 mg/dL (ref 0–99)
Triglycerides: 83 mg/dL (ref 0–149)
VLDL Cholesterol Cal: 17 mg/dL (ref 5–40)

## 2022-02-05 LAB — HEPATIC FUNCTION PANEL
ALT: 10 IU/L (ref 0–32)
AST: 7 IU/L (ref 0–40)
Albumin: 4.3 g/dL (ref 3.8–4.9)
Alkaline Phosphatase: 93 IU/L (ref 44–121)
Bilirubin Total: 0.2 mg/dL (ref 0.0–1.2)
Bilirubin, Direct: <0.1 mg/dL (ref 0.00–0.40)
Total Protein: 7 g/dL (ref 6.0–8.5)

## 2022-02-05 NOTE — Patient Instructions (Signed)
Medication Instructions:  No changes *If you need a refill on your cardiac medications before your next appointment, please call your pharmacy*   Lab Work: Blood work today:  lipids/liver function  If you have labs (blood work) drawn today and your tests are completely normal, you will receive your results only by: [place] (if you have MyChart) OR A paper copy in the mail If you have any lab test that is abnormal or we need to change your treatment, we will call you to review the results.   Testing/Procedures: none   Follow-Up: At [facility], you and your health needs are our priority.  As part of our continuing mission to provide you with exceptional heart care, we have created designated Provider Care Teams.  These Care Teams include your primary Cardiologist (physician) and Advanced Practice Providers (APPs -  Physician Assistants and Nurse Practitioners) who all work together to provide you with the care you need, when you need it.   Your next appointment:   9 month(s)  Provider:   Lenna Sciara, MD

## 2022-02-06 ENCOUNTER — Telehealth: Payer: Self-pay

## 2022-02-06 DIAGNOSIS — E1169 Type 2 diabetes mellitus with other specified complication: Secondary | ICD-10-CM

## 2022-02-06 MED ORDER — ATORVASTATIN CALCIUM 40 MG PO TABS: 60.0000 mg | ORAL_TABLET | Freq: Every day | ORAL | 3 refills | Status: DC

## 2022-02-06 NOTE — Telephone Encounter (Signed)
-----  Message from Early Osmond, MD sent at 02/06/2022  7:03 AM EST ----- Please increase atorvastatin to 60mg  and check lipid panel and lfts in 2 months.

## 2022-02-06 NOTE — Telephone Encounter (Signed)
The patient has been notified of the result and verbalized understanding.  All questions (if any) were answered. Ewell Poe [place], RN 02/06/2022 3:02 PM

## 2022-02-18 ENCOUNTER — Ambulatory Visit: Payer: Commercial Managed Care - HMO | Admitting: Internal Medicine

## 2022-02-19 ENCOUNTER — Encounter (HOSPITAL_COMMUNITY): Payer: Self-pay

## 2022-02-27 ENCOUNTER — Other Ambulatory Visit: Payer: Self-pay | Admitting: Internal Medicine

## 2022-02-27 DIAGNOSIS — E119 Type 2 diabetes mellitus without complications: Secondary | ICD-10-CM

## 2022-03-01 ENCOUNTER — Telehealth: Payer: Self-pay | Admitting: Internal Medicine

## 2022-03-02 NOTE — Telephone Encounter (Signed)
Pt is already scheduled for 03/25/2022 @12 

## 2022-03-09 ENCOUNTER — Other Ambulatory Visit: Payer: Self-pay | Admitting: Internal Medicine

## 2022-03-25 ENCOUNTER — Ambulatory Visit: Payer: Commercial Managed Care - HMO | Admitting: Internal Medicine

## 2022-03-25 ENCOUNTER — Encounter: Payer: Self-pay | Admitting: Internal Medicine

## 2022-03-25 ENCOUNTER — Telehealth: Payer: Self-pay | Admitting: Internal Medicine

## 2022-03-25 VITALS — BP 114/70 | HR 60 | Resp 16 | Ht 70.0 in | Wt 325.0 lb

## 2022-03-25 DIAGNOSIS — I10 Essential (primary) hypertension: Secondary | ICD-10-CM | POA: Diagnosis not present

## 2022-03-25 DIAGNOSIS — Z6841 Body Mass Index (BMI) 40.0 and over, adult: Secondary | ICD-10-CM

## 2022-03-25 DIAGNOSIS — E782 Mixed hyperlipidemia: Secondary | ICD-10-CM | POA: Diagnosis not present

## 2022-03-25 DIAGNOSIS — E119 Type 2 diabetes mellitus without complications: Secondary | ICD-10-CM

## 2022-03-25 MED ORDER — FREESTYLE LIBRE 3 SENSOR MISC: 11 refills | Status: DC

## 2022-03-25 MED ORDER — TIRZEPATIDE 7.5 MG/0.5ML ~~LOC~~ SOAJ: 7.5000 mg | SUBCUTANEOUS | 3 refills | Status: DC

## 2022-03-25 NOTE — Progress Notes (Signed)
Subjective:    Patient ID: Rebecca Avila, female   DOB: 1965-08-22, 57 y.o.   MRN: [id number]   HPI   DM:  Weight is back up again to 325 lbs.  No problems with Mounjaro.  Has not had A1C checked since July.  Did not follow up after visit with [place] in November as was going to cardiology and could not afford both visits.  Encouraged her to call us in future if that is a problem.  She is still on the 5 mg of Mounjaro.  She is not walking as much as she was when she was having weight loss.  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  2.  Hypertension:  Controlled  3.  Hyperlipidemia:  total, LDL improved and trigs fine in January, but HDL remains low.   Lipid Panel     Component Value Date/Time   CHOL 116 02/05/2022 0930   TRIG 83 02/05/2022 0930   HDL 38 (L) 02/05/2022 0930   CHOLHDL 3.1 02/05/2022 0930   LDLCALC 61 02/05/2022 0930   LABVLDL 17 02/05/2022 0930    4.  HM:  no influenza--will not obtain.  Has not had the new COVID vaccination.  Current Meds  Medication Sig   acetaminophen (TYLENOL) 500 MG tablet 2 tabs by mouth twice daily (Patient taking differently: Take 1,000 mg by mouth every 6 (six) hours as needed for moderate pain.)   amLODipine (NORVASC) 10 MG tablet Take 1 tablet (10 mg total) by mouth daily.   atorvastatin (LIPITOR) 40 MG tablet Take 1.5 tablets (60 mg total) by mouth daily. (Patient taking differently: Take 60 mg by mouth daily. Taking 1 tab by mouth daily)   blood glucose meter kit and supplies Dispense based on patient and insurance preference. Use up to four times daily as directed. (FOR ICD-10 E10.9, E11.9).   Blood Glucose Monitoring Suppl (ONETOUCH VERIO) w/Device KIT Check blood sugars twice daily before meals   Calcium Carb-Cholecalciferol (CALCIUM 500+D3) 500-400 MG-UNIT TABS 1 tab by mouth twice daily (Patient taking differently: Take 1 tablet by mouth 2 (two) times daily. 1 tab by mouth twice daily)    clopidogrel (PLAVIX) 75 MG tablet Take 1 tablet (75 mg total) by mouth daily.   cyclobenzaprine (FLEXERIL) 10 MG tablet 1/2 to 1 tab by mouth twice daily as needed for muscle pain and spasm   empagliflozin (JARDIANCE) 10 MG TABS tablet Take 1 tablet (10 mg total) by mouth daily before breakfast. Follow up with your cardiologist   ezetimibe (ZETIA) 10 MG tablet Take 1 tablet (10 mg total) by mouth daily.   glucose blood (ONETOUCH VERIO) test strip Check blood glucose twice daily before meals   insulin glargine (LANTUS SOLOSTAR) 100 UNIT/ML Solostar Pen 25 units injected subcutaneously before breakfast in morning. (Patient taking differently: 20 units injected subcutaneously before breakfast in morning.)   Insulin Pen Needle (RELION PEN NEEDLES) 31G X 6 MM MISC USE 1  ONCE DAILY   Lancets (ONETOUCH ULTRASOFT) lancets Check blood sugar twice daily before meals   losartan-hydrochlorothiazide (HYZAAR) 100-25 MG tablet Take 1 tablet by mouth once daily   metoprolol succinate (TOPROL-XL) 100 MG 24 hr tablet TAKE 1 TABLET BY MOUTH ONCE DAILY (TAKE  WITH  OR  IMMEDIATELY  FOLLOWING  A  MEAL)   MOUNJARO 5 MG/0.5ML Pen INJECT 0.5 ML INTO THE SKIN ONCE A WEEK   nitroGLYCERIN (NITROSTAT) 0.4 MG SL tablet  1 tab under tongue as needed for Chest discomfort and repeat every 5 minutes x 2 if discomfort not relieved   spironolactone (ALDACTONE) 25 MG tablet Take 1 tablet (25 mg total) by mouth daily.   Allergies  Allergen Reactions   Aspirin Anaphylaxis   Ibuprofen Anaphylaxis   Ace Inhibitors Cough     Review of Systems    Objective:   BP 114/70 (BP Location: Left Arm, Patient Position: Sitting, Cuff Size: Normal)   Pulse 60   Resp 16   Ht '5\' 10"'$  (1.778 m)   Wt (!) 325 lb (147.4 kg)   BMI 46.63 kg/m   Physical Exam NAD Lungs:  CTA CV:   RRR without murmur or rub.  Radial pulses normal and equal. LE:  No edema.   Assessment & Plan    DM and obesity:  Encouraged her to start weekly goals  with physical activity outside of walking during work.  To write down on calendar each Monday her new incremental goal for that week.  She is contemplating walking or recumbent stationary bike.  A1C today.  Increase Mounjaro to 7.5 mg weekly.  She has one more dose of 5 mg for tomorrow.    2.  Hypertension:  controlled  3.  Hyperlipidemia:  After looking in chart--goal for her LDL is 55 or below.  She was asked to increase her Atorvastatin to 60 mg, but appears she has not done so.  Will call and ask her to make that change.  She will need to notify cardiology that she was delayed in making the change.  4.  HM:  encouraged yearly flu vaccine and to get the new COVID booster at pharmacy of choice or PHD.

## 2022-03-25 NOTE — Telephone Encounter (Signed)
Patient has been notified of the Atorvastatin instructions.

## 2022-03-26 LAB — HGB A1C W/O EAG: Hgb A1c MFr Bld: 7.2 % — ABNORMAL HIGH (ref 4.8–5.6)

## 2022-04-16 ENCOUNTER — Ambulatory Visit: Payer: 59 | Admitting: Rheumatology

## 2022-04-16 ENCOUNTER — Encounter: Payer: Self-pay | Admitting: Internal Medicine

## 2022-04-24 ENCOUNTER — Other Ambulatory Visit: Payer: 59

## 2022-04-27 ENCOUNTER — Other Ambulatory Visit: Payer: 59

## 2022-04-27 MED ORDER — AMLODIPINE BESYLATE 10 MG PO TABS: 10.0000 mg | ORAL_TABLET | Freq: Every day | ORAL | 10 refills | Status: DC

## 2022-04-27 NOTE — Progress Notes (Signed)
Patient has not been taking MOUNJARO for the past few week. I called the pharmacy and confirmed that it has been on back order with no anticipated date of arrival.   Called the pharmacy and found that they only have Ozempic available.

## 2022-05-04 ENCOUNTER — Telehealth: Payer: Self-pay

## 2022-05-04 NOTE — Telephone Encounter (Signed)
As previously discussed, there is a Forensic scientist with no estimated date that it will become available. After discussing with patient about alternatives, patient has reported that she is interested in starting Ozempic which is currently available at pharmacies.

## 2022-05-07 ENCOUNTER — Ambulatory Visit: Payer: 59 | Admitting: Rheumatology

## 2022-06-22 ENCOUNTER — Other Ambulatory Visit: Payer: Self-pay | Admitting: Internal Medicine

## 2022-06-26 ENCOUNTER — Other Ambulatory Visit: Payer: 59

## 2022-06-30 ENCOUNTER — Ambulatory Visit: Payer: 59 | Admitting: Internal Medicine

## 2022-07-23 ENCOUNTER — Other Ambulatory Visit: Payer: Self-pay | Admitting: Internal Medicine

## 2022-07-30 ENCOUNTER — Other Ambulatory Visit: Payer: Self-pay | Admitting: Internal Medicine

## 2022-08-25 ENCOUNTER — Other Ambulatory Visit: Payer: Self-pay | Admitting: Internal Medicine

## 2022-09-03 ENCOUNTER — Other Ambulatory Visit: Payer: Self-pay | Admitting: Internal Medicine

## 2022-09-16 ENCOUNTER — Other Ambulatory Visit: Payer: Self-pay

## 2022-09-16 ENCOUNTER — Emergency Department (HOSPITAL_BASED_OUTPATIENT_CLINIC_OR_DEPARTMENT_OTHER)
Admission: EM | Admit: 2022-09-16 | Discharge: 2022-09-16 | Disposition: A | Discharge status: Home / Self Care | Payer: 59 | Attending: Emergency Medicine | Admitting: Emergency Medicine

## 2022-09-16 ENCOUNTER — Encounter (HOSPITAL_BASED_OUTPATIENT_CLINIC_OR_DEPARTMENT_OTHER): Payer: Self-pay | Admitting: Emergency Medicine

## 2022-09-16 DIAGNOSIS — R197 Diarrhea, unspecified: Secondary | ICD-10-CM | POA: Insufficient documentation

## 2022-09-16 DIAGNOSIS — Z794 Long term (current) use of insulin: Secondary | ICD-10-CM | POA: Diagnosis not present

## 2022-09-16 DIAGNOSIS — Z20822 Contact with and (suspected) exposure to covid-19: Secondary | ICD-10-CM | POA: Insufficient documentation

## 2022-09-16 DIAGNOSIS — R112 Nausea with vomiting, unspecified: Secondary | ICD-10-CM | POA: Diagnosis present

## 2022-09-16 LAB — CBC
HCT: 44.5 % (ref 36.0–46.0)
Hemoglobin: 14 g/dL (ref 12.0–15.0)
MCH: 25.8 pg — ABNORMAL LOW (ref 26.0–34.0)
MCHC: 31.5 g/dL (ref 30.0–36.0)
MCV: 82.1 fL (ref 80.0–100.0)
Platelets: 311 10*3/uL (ref 150–400)
RBC: 5.42 MIL/uL — ABNORMAL HIGH (ref 3.87–5.11)
RDW: 15.5 % (ref 11.5–15.5)
WBC: 6.1 10*3/uL (ref 4.0–10.5)
nRBC: 0 % (ref 0.0–0.2)

## 2022-09-16 LAB — COMPREHENSIVE METABOLIC PANEL
ALT: 8 U/L (ref 0–44)
AST: 9 U/L — ABNORMAL LOW (ref 15–41)
Albumin: 4.4 g/dL (ref 3.5–5.0)
Alkaline Phosphatase: 67 U/L (ref 38–126)
Anion gap: 10 (ref 5–15)
BUN: 19 mg/dL (ref 6–20)
CO2: 24 mmol/L (ref 22–32)
Calcium: 10.1 mg/dL (ref 8.9–10.3)
Chloride: 104 mmol/L (ref 98–111)
Creatinine, Ser: 1.24 mg/dL — ABNORMAL HIGH (ref 0.44–1.00)
GFR, Estimated: 51 mL/min — ABNORMAL LOW (ref >60)
Glucose, Bld: 80 mg/dL (ref 70–99)
Potassium: 3.5 mmol/L (ref 3.5–5.1)
Sodium: 138 mmol/L (ref 135–145)
Total Bilirubin: 0.5 mg/dL (ref 0.3–1.2)
Total Protein: 7.9 g/dL (ref 6.5–8.1)

## 2022-09-16 LAB — SARS CORONAVIRUS 2 BY RT PCR: SARS Coronavirus 2 by RT PCR: NEGATIVE

## 2022-09-16 LAB — LIPASE, BLOOD: Lipase: 37 U/L (ref 11–51)

## 2022-09-16 MED ORDER — ONDANSETRON 4 MG PO TBDP: 4.0000 mg | ORAL_TABLET | Freq: Three times a day (TID) | ORAL | 0 refills | Status: DC | PRN

## 2022-09-16 NOTE — ED Notes (Signed)
Pt verbalized understanding of d/c instructions, meds, and followup care. Denies questions. VSS, no distress noted. Steady gait to exit with all belongings.  ?

## 2022-09-16 NOTE — Discharge Instructions (Addendum)
Please read and follow all provided instructions.  Your diagnoses today include:  1. Nausea vomiting and diarrhea     TTests performed today include: Blood cell counts and platelets Kidney and liver function tests Pancreas function test (called lipase) Urine test to look for infection COVID test was negative Vital signs. See below for your results today.   Medications prescribed:  Zofran (ondansetron) - for nausea and vomiting  Take any prescribed medications only as directed.  Home care instructions:  Follow any educational materials contained in this packet.  Your abdominal pain, nausea, vomiting, and diarrhea may be caused by a viral gastroenteritis also called 'stomach flu'. You should rest for the next several days. Keep drinking plenty of fluids and use the medicine for nausea as directed.   Drink clear liquids for the next 24 hours and introduce solid foods slowly after 24 hours using the b.r.a.t. diet (Bananas, Rice, Applesauce, Toast, Yogurt).    Follow-up instructions: Please follow-up with your primary care provider in the next 2 days for further evaluation of your symptoms. If you are not feeling better in 48 hours you may have a condition that is more serious and you need re-evaluation.   Return instructions:  SEEK IMMEDIATE MEDICAL ATTENTION IF: If you have pain that does not go away or becomes severe  A temperature above 101F develops  Repeated vomiting occurs (multiple episodes)  If you have pain that becomes localized to portions of the abdomen. The right side could possibly be appendicitis. In an adult, the left lower portion of the abdomen could be colitis or diverticulitis.  Blood is being passed in stools or vomit (bright red or black tarry stools)  You develop chest pain, difficulty breathing, dizziness or fainting, or become confused, poorly responsive, or inconsolable (young children) If you have any other emergent concerns regarding your  health  Additional Information: Abdominal (belly) pain can be caused by many things. Your caregiver performed an examination and possibly ordered blood/urine tests and imaging (CT scan, x-rays, ultrasound). Many cases can be observed and treated at home after initial evaluation in the emergency department. Even though you are being discharged home, abdominal pain can be unpredictable. Therefore, you need a repeated exam if your pain does not resolve, returns, or worsens. Most patients with abdominal pain don't have to be admitted to the hospital or have surgery, but serious problems like appendicitis and gallbladder attacks can start out as nonspecific pain. Many abdominal conditions cannot be diagnosed in one visit, so follow-up evaluations are very important.  Your vital signs today were: BP 122/79 (BP Location: Left Wrist)   Pulse 60   Temp 97.8 F (36.6 C) (Oral)   Resp 18   Ht 5\' 10"  (1.778 m)   Wt (!) 145.6 kg   SpO2 97%   BMI 46.06 kg/m  If your blood pressure (bp) was elevated above 135/85 this visit, please have this repeated by your doctor within one month. --------------

## 2022-09-16 NOTE — ED Provider Notes (Signed)
[place] EMERGENCY DEPARTMENT AT [facility] Provider Note   CSN: [id number] Arrival date & time: 09/16/22  1222     History  Chief Complaint  Patient presents with   Diarrhea   Nausea    Rebecca Avila is a 57 y.o. female.  Patient with no past surgical history presents to the emergency department today for evaluation of nausea, vomiting, and diarrhea.  Symptoms started about 48 hours ago while she was at work.  She came home and had watery, nonbloody stools with associated nausea.  The following day she did not go to work but she did develop some vomiting, continued diarrhea but decreased.  She has had some nonfocal abdominal cramping at times.  No urinary symptoms.  She reports decreased oral intake as this makes the nausea worse.  No known sick contacts.  No recent suspicious food or water exposures.  No antibiotic use.  She tried to go to work today, was concerned that her symptoms could be related to COVID and she did not want to get anybody sick.  This prompted emergency department visit.  No cough or upper respiratory infectious symptoms noted.  No fevers.       Home Medications Prior to Admission medications   Medication Sig Start Date End Date Taking? Authorizing Provider  acetaminophen (TYLENOL) 500 MG tablet 2 tabs by mouth twice daily Patient taking differently: Take 1,000 mg by mouth every 6 (six) hours as needed for moderate pain. 01/25/19   Julieanne Manson, MD  amLODipine (NORVASC) 10 MG tablet Take 1 tablet (10 mg total) by mouth daily. 04/27/22   Julieanne Manson, MD  atorvastatin (LIPITOR) 40 MG tablet Take 1.5 tablets (60 mg total) by mouth daily. Patient taking differently: Take 60 mg by mouth daily. Taking 1 tab by mouth daily 02/06/22   Orbie Pyo, MD  blood glucose meter kit and supplies Dispense based on patient and insurance preference. Use up to four times daily as directed. (FOR ICD-10 E10.9, E11.9). 09/17/21   Burnadette Pop, MD  Blood  Glucose Monitoring Suppl (ONETOUCH VERIO) w/Device KIT Check blood sugars twice daily before meals 11/06/19   Julieanne Manson, MD  Calcium Carb-Cholecalciferol (CALCIUM 500+D3) 500-400 MG-UNIT TABS 1 tab by mouth twice daily Patient taking differently: Take 1 tablet by mouth 2 (two) times daily. 1 tab by mouth twice daily 07/03/20   Julieanne Manson, MD  clopidogrel (PLAVIX) 75 MG tablet Take 1 tablet by mouth once daily 09/03/22   Orbie Pyo, MD  Continuous Blood Gluc Sensor (FREESTYLE LIBRE 3 SENSOR) MISC Place 1 sensor on the skin every 14 days. Use to check glucose continuously 03/25/22   Julieanne Manson, MD  cyclobenzaprine (FLEXERIL) 10 MG tablet 1/2 to 1 tab by mouth twice daily as needed for muscle pain and spasm 11/11/21   Julieanne Manson, MD  empagliflozin (JARDIANCE) 10 MG TABS tablet TAKE 1 TABLET BY MOUTH ONCE DAILY BEFORE BREAKFAST 08/25/22   Orbie Pyo, MD  EPINEPHrine (EPIPEN 2-PAK) 0.3 mg/0.3 mL IJ SOAJ injection Inject 0.3 mLs (0.3 mg total) into the muscle as needed for anaphylaxis. Patient not taking: Reported on 03/25/2022 02/01/19   Julieanne Manson, MD  ezetimibe (ZETIA) 10 MG tablet Take 1 tablet (10 mg total) by mouth daily. 10/15/21   Orbie Pyo, MD  glipiZIDE (GLUCOTROL XL) 10 MG 24 hr tablet Take 1 tablet by mouth once daily with breakfast Patient not taking: Reported on 02/05/2022 11/18/21   Julieanne Manson, MD  glucose blood (  ONETOUCH VERIO) test strip Check blood glucose twice daily before meals 04/18/20   Julieanne Manson, MD  insulin glargine (LANTUS SOLOSTAR) 100 UNIT/ML Solostar Pen 25 units injected subcutaneously before breakfast in morning. Patient taking differently: 20 units injected subcutaneously before breakfast in morning. 09/26/21   Julieanne Manson, MD  Insulin Pen Needle (RELION PEN NEEDLES) 31G X 6 MM MISC USE 1  ONCE DAILY 03/11/22   Julieanne Manson, MD  Lancets [facility] ULTRASOFT) lancets Check blood sugar twice  daily before meals 04/18/20   Julieanne Manson, MD  losartan-hydrochlorothiazide [facility]) 100-25 MG tablet Take 1 tablet by mouth once daily 07/31/22   Julieanne Manson, MD  metoprolol succinate (TOPROL-XL) 100 MG 24 hr tablet TAKE 1 TABLET BY MOUTH ONCE DAILY WITH OR FOLLOWING A MEAL 06/22/22   Julieanne Manson, MD  nitroGLYCERIN (NITROSTAT) 0.4 MG SL tablet 1 tab under tongue as needed for Chest discomfort and repeat every 5 minutes x 2 if discomfort not relieved 08/06/21   Julieanne Manson, MD  spironolactone (ALDACTONE) 25 MG tablet Take 1 tablet by mouth once daily 07/23/22   Orbie Pyo, MD  tirzepatide [facility]) 7.5 MG/0.5ML Pen Inject 7.5 mg into the skin once a week. 03/25/22   Julieanne Manson, MD      Allergies    Aspirin, Ibuprofen, and Ace inhibitors    Review of Systems   Review of Systems  Physical Exam Updated Vital Signs BP 129/85 (BP Location: Right Arm)   Pulse 62   Temp 97.8 F (36.6 C) (Oral)   Resp 16   Ht 5\' 10"  (1.778 m)   Wt (!) 145.6 kg   SpO2 99%   BMI 46.06 kg/m   Physical Exam Vitals and nursing note reviewed.  Constitutional:      General: She is not in acute distress.    Appearance: She is well-developed.  HENT:     Head: Normocephalic and atraumatic.     Right Ear: External ear normal.     Left Ear: External ear normal.     Nose: Nose normal.  Eyes:     Conjunctiva/sclera: Conjunctivae normal.  Cardiovascular:     Rate and Rhythm: Normal rate and regular rhythm.     Heart sounds: No murmur heard. Pulmonary:     Effort: No respiratory distress.     Breath sounds: No wheezing, rhonchi or rales.  Abdominal:     Palpations: Abdomen is soft.     Tenderness: There is no abdominal tenderness. There is no guarding or rebound.  Musculoskeletal:     Cervical back: Normal range of motion and neck supple.     Right lower leg: No edema.     Left lower leg: No edema.  Skin:    General: Skin is warm and dry.     Findings: No rash.   Neurological:     General: No focal deficit present.     Mental Status: She is alert. Mental status is at baseline.     Motor: No weakness.  Psychiatric:        Mood and Affect: Mood normal.     ED Results / Procedures / Treatments   Labs (all labs ordered are listed, but only abnormal results are displayed) Labs Reviewed  SARS CORONAVIRUS 2 BY RT PCR  LIPASE, BLOOD  COMPREHENSIVE METABOLIC PANEL  CBC  URINALYSIS, ROUTINE W REFLEX MICROSCOPIC    EKG None  Radiology No results found.  Procedures Procedures    Medications Ordered in ED Medications - No  data to display  ED Course/ Medical Decision Making/ A&P    Patient seen and examined. History obtained directly from patient.   Labs/EKG: Ordered CBC, CMP, lipase, UA, COVID testing.  Imaging: None ordered  Medications/Fluids: Patient to signs medication for pain or nausea at this time  Most recent vital signs reviewed and are as follows: BP 129/85 (BP Location: Right Arm)   Pulse 62   Temp 97.8 F (36.6 C) (Oral)   Resp 16   Ht 5\' 10"  (1.778 m)   Wt (!) 145.6 kg   SpO2 99%   BMI 46.06 kg/m   Initial impression: Nausea, vomiting, diarrhea, reassuring exam and vitals, awaiting lab workup.    Reassessment performed. Patient appears stable.  She has been eating a frozen meal and drinking ginger ale in the room without any difficulties.  Labs personally reviewed and interpreted including: CBC with normal white blood cell count, normal hemoglobin; CMP with minimally elevated creatinine at 1.24 and a normal BUN of 19; lipase normal; negative coronavirus testing.  Reviewed pertinent lab work and imaging with patient at bedside. Questions answered.   Most current vital signs reviewed and are as follows: BP 103/66   Pulse (!) 59   Temp 97.8 F (36.6 C) (Oral)   Resp 19   Ht 5\' 10"  (1.778 m)   Wt (!) 145.6 kg   SpO2 98%   BMI 46.06 kg/m   Plan: Will plan to discharge to home.  Prescriptions written  for: Zofran  Other home care instructions discussed: Parke Simmers diet  ED return instructions discussed: The patient was urged to return to the Emergency Department immediately with worsening of current symptoms, worsening abdominal pain, persistent vomiting, blood noted in stools, fever, or any other concerns. The patient verbalized understanding.   Follow-up instructions discussed: Patient encouraged to follow-up with their PCP in 2-3 days if symptoms do not continue to steadily improve.                                Medical Decision Making Amount and/or Complexity of Data Reviewed Labs: ordered.  Risk Prescription drug management.   For this patient's complaint of abdominal pain, the following conditions were considered on the differential diagnosis: gastritis/PUD, enteritis/duodenitis, appendicitis, cholelithiasis/cholecystitis, cholangitis, pancreatitis, ruptured viscus, colitis, diverticulitis, small/large bowel obstruction, proctitis, cystitis, pyelonephritis, ureteral colic, aortic dissection, aortic aneurysm. In women, ectopic pregnancy, pelvic inflammatory disease, ovarian cysts, and tubo-ovarian abscess were also considered. Atypical chest etiologies were also considered including ACS, PE, and pneumonia.  Patient symptoms have been improving since yesterday.  She is starting to feel hungry again.  She is eating and drinking in the room here without difficulty.  At this point, we will defer imaging and continue to monitor improvement.  She will be given a prescription for Zofran.  Patient is in agreement.  The patient's vital signs, pertinent lab work and imaging were reviewed and interpreted as discussed in the ED course. Hospitalization was considered for further testing, treatments, or serial exams/observation. However as patient is well-appearing, has a stable exam, and reassuring studies today, I do not feel that they warrant admission at this time. This plan was discussed with the  patient who verbalizes agreement and comfort with this plan and seems reliable and able to return to the Emergency Department with worsening or changing symptoms.           Final Clinical Impression(s) / ED Diagnoses Final diagnoses:  Nausea vomiting and diarrhea    Rx / DC Orders ED Discharge Orders          Ordered    ondansetron (ZOFRAN-ODT) 4 MG disintegrating tablet  Every 8 hours PRN        09/16/22 1458              Renne Crigler, PA-C 09/16/22 1735    Arby Barrette, MD 09/25/22 1755

## 2022-09-16 NOTE — ED Triage Notes (Signed)
Pt via pov from home with nausea and diarrhea x 2 days. Emesis yesterday. Pt unable to eat since Monday. Pt has some bilateral lower abdominal pain x 2 days. Pt alert & oriented, nad noted.

## 2022-09-21 ENCOUNTER — Other Ambulatory Visit: Payer: 59

## 2022-09-23 ENCOUNTER — Other Ambulatory Visit (INDEPENDENT_AMBULATORY_CARE_PROVIDER_SITE_OTHER): Payer: 59

## 2022-09-23 DIAGNOSIS — E119 Type 2 diabetes mellitus without complications: Secondary | ICD-10-CM | POA: Diagnosis not present

## 2022-09-24 ENCOUNTER — Encounter: Payer: Self-pay | Admitting: Internal Medicine

## 2022-09-24 ENCOUNTER — Ambulatory Visit (INDEPENDENT_AMBULATORY_CARE_PROVIDER_SITE_OTHER): Payer: 59 | Admitting: Internal Medicine

## 2022-09-24 VITALS — BP 122/70 | HR 65 | Resp 16 | Ht 70.0 in | Wt 324.0 lb

## 2022-09-24 DIAGNOSIS — Z6841 Body Mass Index (BMI) 40.0 and over, adult: Secondary | ICD-10-CM

## 2022-09-24 DIAGNOSIS — Z794 Long term (current) use of insulin: Secondary | ICD-10-CM

## 2022-09-24 DIAGNOSIS — Z1231 Encounter for screening mammogram for malignant neoplasm of breast: Secondary | ICD-10-CM | POA: Diagnosis not present

## 2022-09-24 DIAGNOSIS — E1165 Type 2 diabetes mellitus with hyperglycemia: Secondary | ICD-10-CM | POA: Diagnosis not present

## 2022-09-24 DIAGNOSIS — E785 Hyperlipidemia, unspecified: Secondary | ICD-10-CM

## 2022-09-24 DIAGNOSIS — E1169 Type 2 diabetes mellitus with other specified complication: Secondary | ICD-10-CM

## 2022-09-24 DIAGNOSIS — I10 Essential (primary) hypertension: Secondary | ICD-10-CM

## 2022-09-24 LAB — HEMOGLOBIN A1C
Est. average glucose Bld gHb Est-mCnc: 166 mg/dL
Hgb A1c MFr Bld: 7.4 % — ABNORMAL HIGH (ref 4.8–5.6)

## 2022-09-24 MED ORDER — TIRZEPATIDE 7.5 MG/0.5ML ~~LOC~~ SOAJ: 7.5000 mg | SUBCUTANEOUS | 3 refills | Status: DC

## 2022-09-24 MED ORDER — TIRZEPATIDE 5 MG/0.5ML ~~LOC~~ SOAJ: 5.0000 mg | SUBCUTANEOUS | 0 refills | Status: DC

## 2022-09-24 NOTE — Progress Notes (Signed)
Subjective:    Patient ID: Rebecca Avila, female   DOB: 09/24/1965, 57 y.o.   MRN: [id number]   HPI   DM/Obesity:  never heard from [facility] about Mounjaro and issues with Ozempic at lower dosing without coverage as well.  She has not used mounjaro since last seen in February.  Increased her glipizide to 10 mg in March again.  Sugars are in low 100s when checks.  A1C was not quite at goal at 7.4%.    2.  Hypertension:  BP finally at a good level.    3.  Left precordial chest pain 07/2021.  Followed by Dr. Lynnette Caffey, cardiology.  Echo showed good EF and no regional wall motion abnormality.  Do not see that a coronary artery CT was ever performed as ordered, but she has been asymptomatic for some time and treating symptomatically.  4.  Hyperlipidemia with continued low HDL:  she is working on increasing her steps--currently on 5000 steps daily.  Eating more fish.  Discussed increasing good fats.    Current Meds  Medication Sig   acetaminophen (TYLENOL) 500 MG tablet 2 tabs by mouth twice daily (Patient taking differently: Take 1,000 mg by mouth every 6 (six) hours as needed for moderate pain.)   amLODipine (NORVASC) 10 MG tablet Take 1 tablet (10 mg total) by mouth daily.   atorvastatin (LIPITOR) 40 MG tablet Take 1.5 tablets (60 mg total) by mouth daily. (Patient taking differently: Take 60 mg by mouth daily. Taking 1 tab by mouth daily)   blood glucose meter kit and supplies Dispense based on patient and insurance preference. Use up to four times daily as directed. (FOR ICD-10 E10.9, E11.9).   Blood Glucose Monitoring Suppl (ONETOUCH VERIO) w/Device KIT Check blood sugars twice daily before meals   clopidogrel (PLAVIX) 75 MG tablet Take 1 tablet by mouth once daily   Continuous Blood Gluc Sensor (FREESTYLE LIBRE 3 SENSOR) MISC Place 1 sensor on the skin every 14 days. Use to check glucose continuously   cyclobenzaprine (FLEXERIL) 10 MG tablet 1/2 to 1 tab by mouth twice daily as needed for  muscle pain and spasm   empagliflozin (JARDIANCE) 10 MG TABS tablet TAKE 1 TABLET BY MOUTH ONCE DAILY BEFORE BREAKFAST   EPINEPHrine (EPIPEN 2-PAK) 0.3 mg/0.3 mL IJ SOAJ injection Inject 0.3 mLs (0.3 mg total) into the muscle as needed for anaphylaxis.   ezetimibe (ZETIA) 10 MG tablet Take 1 tablet (10 mg total) by mouth daily.   glipiZIDE (GLUCOTROL XL) 10 MG 24 hr tablet Take 1 tablet by mouth once daily with breakfast   glucose blood (ONETOUCH VERIO) test strip Check blood glucose twice daily before meals   insulin glargine (LANTUS SOLOSTAR) 100 UNIT/ML Solostar Pen 25 units injected subcutaneously before breakfast in morning. (Patient taking differently: 20 units injected subcutaneously before breakfast in morning.)   Insulin Pen Needle (RELION PEN NEEDLES) 31G X 6 MM MISC USE 1  ONCE DAILY   Lancets (ONETOUCH ULTRASOFT) lancets Check blood sugar twice daily before meals   losartan-hydrochlorothiazide (HYZAAR) 100-25 MG tablet Take 1 tablet by mouth once daily   metoprolol succinate (TOPROL-XL) 100 MG 24 hr tablet TAKE 1 TABLET BY MOUTH ONCE DAILY WITH OR FOLLOWING A MEAL   nitroGLYCERIN (NITROSTAT) 0.4 MG SL tablet 1 tab under tongue as needed for Chest discomfort and repeat every 5 minutes x 2 if discomfort not relieved   ondansetron (ZOFRAN-ODT) 4 MG disintegrating tablet Take 1 tablet (4 mg total) by mouth  every 8 (eight) hours as needed for nausea or vomiting.  She is taking Spironolactone.   Allergies  Allergen Reactions   Aspirin Anaphylaxis   Ibuprofen Anaphylaxis   Ace Inhibitors Cough     Review of Systems    Objective:   BP 122/70 (BP Location: Left Arm, Patient Position: Sitting, Cuff Size: Normal)   Pulse 65   Resp 16   Ht 5\' 10"  (1.778 m)   Wt (!) 324 lb (147 kg)   BMI 46.49 kg/m   Physical Exam Constitutional:      Appearance: She is obese.  HENT:     Head: Normocephalic and atraumatic.  Eyes:     Extraocular Movements: Extraocular movements intact.      Pupils: Pupils are equal, round, and reactive to light.  Cardiovascular:     Rate and Rhythm: Normal rate and regular rhythm.     Pulses:          Dorsalis pedis pulses are 2+ on the right side and 2+ on the left side.       Posterior tibial pulses are 2+ on the right side and 2+ on the left side.     Heart sounds: S1 normal and S2 normal. No murmur heard.    No friction rub. No S3 or S4 sounds.     Comments: No carotid bruits.  Carotid, radial, DP and PT pulses normal and equal.  Trace edema at ankles bilaterally  Pulmonary:     Effort: Pulmonary effort is normal.     Breath sounds: Normal breath sounds.  Abdominal:     Palpations: Abdomen is soft.  Feet:     Right foot:     Protective Sensation: 10 sites tested.  10 sites sensed.     Skin integrity: Skin integrity normal.     Toenail Condition: Right toenails are normal.     Left foot:     Protective Sensation: 10 sites tested.  10 sites sensed.     Skin integrity: Skin integrity normal.     Toenail Condition: Left toenails are normal.  Skin:    Capillary Refill: Capillary refill takes less than 2 seconds.  Neurological:     General: No focal deficit present.     Mental Status: She is oriented to person, place, and time.      Assessment & Plan   HM:  recommended Shingrix today, but wants to discuss with her husband.  Encouraged influenza and COVID vaccines in September/October.  Mammogram ordered.  2.  DM/obesity:  restart Mounjaro at 5 mg weekly for 4 weeks then increase to 7.5 mg weekly thereafter.  Decrease Glipizide to 5 mg daily when initiates 5 mg Mounjaro.  Discussed would wean glipizide first, insulin second if sugars and weight come down.   Urine microalbumin/crea  3.  Hypertension:  now at goal.  Discussed if loses enough weight, could possibly wean diuretics first then amlodipine.    4.  Hyperlipidemia with low HDL.  Discussed goals for diet and physical activity to increase HDL.

## 2022-09-24 NOTE — Patient Instructions (Signed)
Cut Glipizide to 5 mg daily once start Mounjaro 5 mg weekly

## 2022-09-25 ENCOUNTER — Other Ambulatory Visit: Payer: Self-pay | Admitting: Internal Medicine

## 2022-09-25 LAB — MICROALBUMIN / CREATININE URINE RATIO
Creatinine, Urine: 58.4 mg/dL
Microalb/Creat Ratio: 19 mg/g{creat} (ref 0–29)
Microalbumin, Urine: 11.2 ug/mL

## 2022-10-11 ENCOUNTER — Other Ambulatory Visit: Payer: Self-pay | Admitting: Internal Medicine

## 2022-10-15 ENCOUNTER — Other Ambulatory Visit: Payer: Self-pay

## 2022-10-15 MED ORDER — CYCLOBENZAPRINE HCL 10 MG PO TABS: ORAL_TABLET | ORAL | 0 refills | Status: DC

## 2022-10-19 ENCOUNTER — Other Ambulatory Visit: Payer: Self-pay | Admitting: Internal Medicine

## 2022-10-23 ENCOUNTER — Other Ambulatory Visit: Payer: Self-pay | Admitting: Internal Medicine

## 2022-10-27 ENCOUNTER — Inpatient Hospital Stay
Admission: RE | Admit: 2022-10-27 | Discharge: 2022-10-27 | Discharge status: Home / Self Care | Payer: 59 | Source: Ambulatory Visit | Attending: Internal Medicine | Admitting: Internal Medicine

## 2022-10-27 DIAGNOSIS — Z1231 Encounter for screening mammogram for malignant neoplasm of breast: Secondary | ICD-10-CM

## 2022-10-30 NOTE — Progress Notes (Signed)
Cardiology Office Note:   Date:  10/30/2022  ID:  Rebecca Avila, DOB 06/16/1965, MRN [id number] PCP:  Julieanne Manson, MD  [facility] Providers Cardiologist:  Alverda Skeans, MD Referring MD: Julieanne Manson, MD   Chief Complaint/Reason for Referral: Cardiology follow-up ASSESSMENT:    1. Precordial pain   2. Type 2 diabetes mellitus without complication, unspecified whether long term insulin use (HCC)   3. Hypertension associated with diabetes (HCC)   4. CKD (chronic kidney disease) stage 2, GFR 60-89 ml/min   5. Hyperlipidemia associated with type 2 diabetes mellitus (HCC)   6. Body mass index (BMI) of 50.0 to 59.9 in adult (HCC)   7. Elevated lipoprotein(a)     PLAN:   In order of problems listed above: 1.  Precordial pain: 2.  Type 2 diabetes: Continue Plavix, atorvastatin, Jardiance, and Hyzaar. 3.  Hypertension: 4.  CKD: Continue Hyzaar Jardiance. 5.  Hyperlipidemia: Check lipid panel and LFTs; 6.  Elevated BMI: Is patient taking Mounjaro? 7.  Elevated BMI: Refer to pharmacy for PCSK9 inhibitor therapy.           Dispo:  No follow-ups on file.      Medication Adjustments/Labs and Tests Ordered: Current medicines are reviewed at length with the patient today.  Concerns regarding medicines are outlined above.  The following changes have been made:     Labs/tests ordered: No orders of the defined types were placed in this encounter.   Medication Changes: No orders of the defined types were placed in this encounter.   Current medicines are reviewed at length with the patient today.  The patient  concerns regarding medicines.  History of Present Illness:      FOCUSED PROBLEM LIST:   Type 2 diabetes on insulin ACE inhibitor induced cough Hypertension Hyperlipidemia Elevated LP(a) BMI 50 Stage II chronic kidney disease Aspirin allergy with anaphylaxis Right bundle branch block  July 2023 consultation: The patient is a 57 y.o. female with  the indicated medical history here for recommendations regarding chest pain.  Patient was seen by her primary care provider recently.  She reported chest pain since January.  It occurs in her midsternal area and comes and goes.  It can occur at rest or with walking.  She denies any dyspnea.   The patient tells me that on occasion she will get a central chest tightness.  There is no radiation.  It can happen at rest and with exertion.  She cannot specify any alleviating or exacerbating factors.  She denies any presyncope or syncope.  She denies any palpitations or paroxysmal nocturnal dyspnea.  She does have chronic lower extremity edema of her left side.  She has had no severe bleeding or bruising.   She does not smoke.  She moved from [place] about 20 years ago.  She works full-time in Audiological [employer].  Plan: Obtain coronary CTA and echocardiogram, start Plavix in lieu of aspirin due to aspirin allergy, start Jardiance, stop amlodipine, check lipid panel LP(a), refer to pharmacy for elevated BMI, and refer for sleep apnea evaluation.   January 2024: In the interim the patient did see pharmacy the patient was started on Mounjaro.  Metformin was ultimately stopped due to diarrhea and amlodipine restarted with no recurrence of lower extremity edema.  She was also started on Aldactone.  She was started on Zetia due to an LDL not at goal.  An echocardiogram was done which was reassuring.  However the patient did not have her coronary CTA  performed.   The patient is here with her husband.  She feels very well.  She denies any shortness of breath, chest pain, paroxysmal nocturnal dyspnea, orthopnea.  She is lost 7 pounds.  She no longer feels tired in a day.  She has not required any emergency room visits or hospitalizations for cardiovascular issues recently.  She was seen in the emergency department in December for weakness; her evaluation was reassuring and she was discharged home.  Plan: Check lipid panel and LFTs  today.  October 2024: In the interim the patient's atorvastatin was increased to 60 mg.  She was seen by her primary care provider in August and was doing well.         Current Medications: No outpatient medications have been marked as taking for the 11/02/22 encounter (Appointment) with Orbie Pyo, MD.      Review of Systems:   Please see the history of present illness.    All other systems reviewed and are negative.      EKGs/Labs/Other Test Reviewed:   EKG: EKG performed August 2023 demonstrates sinus rhythm with right bundle branch block  EKG Interpretation Date/Time:    Ventricular Rate:    PR Interval:    QRS Duration:    QT Interval:    QTC Calculation:   R Axis:      Text Interpretation:           Risk Assessment/Calculations: \         Physical Exam:   VS:  There were no vitals taken for this visit.      Wt Readings from Last 3 Encounters:  09/24/22 (!) 324 lb (147 kg)  09/16/22 (!) 321 lb (145.6 kg)  04/27/22 (!) 323 lb (146.5 kg)      GENERAL:  No apparent distress, AOx3 HEENT:  No carotid bruits, +2 carotid impulses, no scleral icterus CAR: RRR Irregular RR no murmurs gallops, rubs, or thrills RES:  Clear to auscultation bilaterally ABD:  Soft, nontender, nondistended, positive bowel sounds x 4 VASC:  +2 radial pulses, +2 carotid pulses NEURO:  CN 2-12 grossly intact; motor and sensory grossly intact PSYCH:  No active depression or anxiety EXT:  No edema, ecchymosis, or cyanosis  Signed, Orbie Pyo, MD  10/30/2022 6:16 PM    [facility] [address] [place], [place], [place]  [address] Phone: [phone]; Fax: [phone]   Note:  This document was prepared using Dragon voice recognition software and may include unintentional dictation errors.

## 2022-11-02 ENCOUNTER — Encounter: Payer: 59 | Admitting: Internal Medicine

## 2022-11-02 DIAGNOSIS — R072 Precordial pain: Secondary | ICD-10-CM

## 2022-11-02 DIAGNOSIS — E119 Type 2 diabetes mellitus without complications: Secondary | ICD-10-CM

## 2022-11-02 DIAGNOSIS — N182 Chronic kidney disease, stage 2 (mild): Secondary | ICD-10-CM

## 2022-11-02 DIAGNOSIS — E7841 Elevated Lipoprotein(a): Secondary | ICD-10-CM

## 2022-11-02 DIAGNOSIS — E1159 Type 2 diabetes mellitus with other circulatory complications: Secondary | ICD-10-CM

## 2022-11-02 DIAGNOSIS — E1169 Type 2 diabetes mellitus with other specified complication: Secondary | ICD-10-CM

## 2022-11-02 DIAGNOSIS — Z6841 Body Mass Index (BMI) 40.0 and over, adult: Secondary | ICD-10-CM

## 2022-11-17 ENCOUNTER — Other Ambulatory Visit: Payer: Self-pay | Admitting: Internal Medicine

## 2022-11-24 ENCOUNTER — Other Ambulatory Visit: Payer: 59

## 2022-12-03 ENCOUNTER — Emergency Department (HOSPITAL_BASED_OUTPATIENT_CLINIC_OR_DEPARTMENT_OTHER): Payer: 59

## 2022-12-03 ENCOUNTER — Emergency Department (HOSPITAL_BASED_OUTPATIENT_CLINIC_OR_DEPARTMENT_OTHER)
Admission: EM | Admit: 2022-12-03 | Discharge: 2022-12-03 | Disposition: A | Discharge status: Home / Self Care | Payer: 59 | Attending: Emergency Medicine | Admitting: Emergency Medicine

## 2022-12-03 ENCOUNTER — Encounter (HOSPITAL_BASED_OUTPATIENT_CLINIC_OR_DEPARTMENT_OTHER): Payer: Self-pay | Admitting: Emergency Medicine

## 2022-12-03 ENCOUNTER — Other Ambulatory Visit: Payer: Self-pay

## 2022-12-03 DIAGNOSIS — Z794 Long term (current) use of insulin: Secondary | ICD-10-CM | POA: Diagnosis not present

## 2022-12-03 DIAGNOSIS — Z7901 Long term (current) use of anticoagulants: Secondary | ICD-10-CM | POA: Insufficient documentation

## 2022-12-03 DIAGNOSIS — J039 Acute tonsillitis, unspecified: Secondary | ICD-10-CM | POA: Diagnosis not present

## 2022-12-03 DIAGNOSIS — E119 Type 2 diabetes mellitus without complications: Secondary | ICD-10-CM | POA: Diagnosis not present

## 2022-12-03 DIAGNOSIS — J029 Acute pharyngitis, unspecified: Secondary | ICD-10-CM | POA: Diagnosis present

## 2022-12-03 LAB — CBC WITH DIFFERENTIAL/PLATELET
Abs Immature Granulocytes: 0.06 10*3/uL (ref 0.00–0.07)
Basophils Absolute: 0 10*3/uL (ref 0.0–0.1)
Basophils Relative: 0 %
Eosinophils Absolute: 0.4 10*3/uL (ref 0.0–0.5)
Eosinophils Relative: 3 %
HCT: 40.7 % (ref 36.0–46.0)
Hemoglobin: 12.9 g/dL (ref 12.0–15.0)
Immature Granulocytes: 1 %
Lymphocytes Relative: 20 %
Lymphs Abs: 2.2 10*3/uL (ref 0.7–4.0)
MCH: 25.5 pg — ABNORMAL LOW (ref 26.0–34.0)
MCHC: 31.7 g/dL (ref 30.0–36.0)
MCV: 80.6 fL (ref 80.0–100.0)
Monocytes Absolute: 0.6 10*3/uL (ref 0.1–1.0)
Monocytes Relative: 6 %
Neutro Abs: 7.8 10*3/uL — ABNORMAL HIGH (ref 1.7–7.7)
Neutrophils Relative %: 70 %
Platelets: 303 10*3/uL (ref 150–400)
RBC: 5.05 MIL/uL (ref 3.87–5.11)
RDW: 14.9 % (ref 11.5–15.5)
WBC: 11.1 10*3/uL — ABNORMAL HIGH (ref 4.0–10.5)
nRBC: 0 % (ref 0.0–0.2)

## 2022-12-03 LAB — BASIC METABOLIC PANEL
Anion gap: 8 (ref 5–15)
BUN: 17 mg/dL (ref 6–20)
CO2: 28 mmol/L (ref 22–32)
Calcium: 10 mg/dL (ref 8.9–10.3)
Chloride: 103 mmol/L (ref 98–111)
Creatinine, Ser: 1.41 mg/dL — ABNORMAL HIGH (ref 0.44–1.00)
GFR, Estimated: 44 mL/min — ABNORMAL LOW (ref >60)
Glucose, Bld: 89 mg/dL (ref 70–99)
Potassium: 4.4 mmol/L (ref 3.5–5.1)
Sodium: 139 mmol/L (ref 135–145)

## 2022-12-03 LAB — GROUP A STREP BY PCR: Group A Strep by PCR: NOT DETECTED

## 2022-12-03 MED ORDER — OXYCODONE-ACETAMINOPHEN 5-325 MG PO TABS
1.0000 | ORAL_TABLET | Freq: Once | ORAL | Status: AC
  Administered 2022-12-03: 1 via ORAL
  Filled 2022-12-03: qty 1

## 2022-12-03 MED ORDER — OXYCODONE HCL 5 MG PO TABS: 5.0000 mg | ORAL_TABLET | Freq: Four times a day (QID) | ORAL | 0 refills | Status: DC | PRN

## 2022-12-03 MED ORDER — AMOXICILLIN-POT CLAVULANATE 875-125 MG PO TABS: 1.0000 | ORAL_TABLET | Freq: Two times a day (BID) | ORAL | 0 refills | Status: AC

## 2022-12-03 MED ORDER — PREDNISONE 20 MG PO TABS: 40.0000 mg | ORAL_TABLET | Freq: Every day | ORAL | 0 refills | Status: AC

## 2022-12-03 MED ORDER — MORPHINE SULFATE (PF) 4 MG/ML IV SOLN
4.0000 mg | Freq: Once | INTRAVENOUS | Status: AC
  Administered 2022-12-03: 4 mg via INTRAVENOUS
  Filled 2022-12-03: qty 1

## 2022-12-03 MED ORDER — IOHEXOL 300 MG/ML  SOLN
100.0000 mL | Freq: Once | INTRAMUSCULAR | Status: AC | PRN
  Administered 2022-12-03: 80 mL via INTRAVENOUS

## 2022-12-03 MED ORDER — ACETAMINOPHEN 325 MG PO TABS
650.0000 mg | ORAL_TABLET | Freq: Once | ORAL | Status: AC | PRN
  Administered 2022-12-03: 650 mg via ORAL
  Filled 2022-12-03: qty 2

## 2022-12-03 MED ORDER — AMOXICILLIN-POT CLAVULANATE 875-125 MG PO TABS
1.0000 | ORAL_TABLET | Freq: Once | ORAL | Status: AC
  Administered 2022-12-03: 1 via ORAL
  Filled 2022-12-03: qty 1

## 2022-12-03 MED ORDER — PREDNISONE 20 MG PO TABS
40.0000 mg | ORAL_TABLET | Freq: Once | ORAL | Status: AC
  Administered 2022-12-03: 40 mg via ORAL
  Filled 2022-12-03 (×2): qty 2

## 2022-12-03 NOTE — ED Triage Notes (Signed)
Pt endorses sore throat, difficulty swallowing x 1 week

## 2022-12-03 NOTE — ED Provider Notes (Signed)
[place] EMERGENCY DEPARTMENT AT [facility] Provider Note   CSN: [id number] Arrival date & time: 12/03/22  9562     History  Chief Complaint  Patient presents with   Sore Throat    Rebecca Avila is a 57 y.o. female.   Sore Throat  57 year old female history of diabetes, depression, anxiety presenting for sore throat, difficulty swallowing.  She states over the last week she and her husband have had nonproductive cough, congestion, URI.  The symptoms are improving but her sore throat is worsening.  She is bilateral sore throat and does not want to swallow due to pain.  She is able to swallow.  No nausea or vomiting.  No chest pain or shortness of breath.  No abdominal pain.  She does feel that her voice is slightly different.     Home Medications Prior to Admission medications   Medication Sig Start Date End Date Taking? Authorizing Provider  amoxicillin-clavulanate (AUGMENTIN) 875-125 MG tablet Take 1 tablet by mouth every 12 (twelve) hours for 10 days. 12/03/22 12/13/22 Yes Laurence Spates, MD  oxyCODONE (ROXICODONE) 5 MG immediate release tablet Take 1 tablet (5 mg total) by mouth every 6 (six) hours as needed for severe pain (pain score 7-10). 12/03/22  Yes Laurence Spates, MD  predniSONE (DELTASONE) 20 MG tablet Take 2 tablets (40 mg total) by mouth daily for 5 days. 12/03/22 12/08/22 Yes Laurence Spates, MD  acetaminophen (TYLENOL) 500 MG tablet 2 tabs by mouth twice daily Patient taking differently: Take 1,000 mg by mouth every 6 (six) hours as needed for moderate pain. 01/25/19   Julieanne Manson, MD  amLODipine (NORVASC) 10 MG tablet Take 1 tablet (10 mg total) by mouth daily. 04/27/22   Julieanne Manson, MD  atorvastatin (LIPITOR) 40 MG tablet TAKE 1 TABLET BY MOUTH ONCE DAILY *STOP  ZOCOR* 10/12/22   Orbie Pyo, MD  blood glucose meter kit and supplies Dispense based on patient and insurance preference. Use up to four times daily as directed. (FOR  ICD-10 E10.9, E11.9). 09/17/21   Burnadette Pop, MD  Blood Glucose Monitoring Suppl (ONETOUCH VERIO) w/Device KIT Check blood sugars twice daily before meals 11/06/19   Julieanne Manson, MD  Calcium Carb-Cholecalciferol (CALCIUM 500+D3) 500-400 MG-UNIT TABS 1 tab by mouth twice daily Patient taking differently: Take 1 tablet by mouth daily. 1 tab by mouth twice daily 07/03/20   Julieanne Manson, MD  clopidogrel (PLAVIX) 75 MG tablet Take 1 tablet by mouth once daily 09/03/22   Orbie Pyo, MD  Continuous Blood Gluc Sensor (FREESTYLE LIBRE 3 SENSOR) MISC Place 1 sensor on the skin every 14 days. Use to check glucose continuously 03/25/22   Julieanne Manson, MD  cyclobenzaprine (FLEXERIL) 10 MG tablet 1/2 to 1 tab by mouth twice daily as needed for muscle pain and spasm 10/15/22   Julieanne Manson, MD  empagliflozin (JARDIANCE) 10 MG TABS tablet TAKE 1 TABLET BY MOUTH ONCE DAILY BEFORE BREAKFAST 08/25/22   Orbie Pyo, MD  EPINEPHrine (EPIPEN 2-PAK) 0.3 mg/0.3 mL IJ SOAJ injection Inject 0.3 mLs (0.3 mg total) into the muscle as needed for anaphylaxis. 02/01/19   Julieanne Manson, MD  ezetimibe (ZETIA) 10 MG tablet Take 1 tablet by mouth once daily 09/25/22   Orbie Pyo, MD  glipiZIDE (GLUCOTROL XL) 10 MG 24 hr tablet Take 1 tablet by mouth once daily with breakfast 11/18/21   Julieanne Manson, MD  glucose blood (ONETOUCH VERIO) test strip Check blood glucose twice  daily before meals 04/18/20   Julieanne Manson, MD  Insulin Pen Needle (RELION PEN NEEDLES) 31G X 6 MM MISC USE 1  ONCE DAILY 03/11/22   Julieanne Manson, MD  Lancets [facility] ULTRASOFT) lancets Check blood sugar twice daily before meals 04/18/20   Julieanne Manson, MD  LANTUS SOLOSTAR 100 UNIT/ML Solostar Pen 20 units injected subcutaneously before breakfast in morning. 11/22/22   Julieanne Manson, MD  losartan-hydrochlorothiazide [facility]) 100-25 MG tablet Take 1 tablet by mouth once daily 07/31/22   Julieanne Manson, MD  metoprolol succinate (TOPROL-XL) 100 MG 24 hr tablet TAKE 1 TABLET BY MOUTH ONCE DAILY WITH OR FOLLOWING A MEAL 06/22/22   Julieanne Manson, MD  nitroGLYCERIN (NITROSTAT) 0.4 MG SL tablet 1 tab under tongue as needed for Chest discomfort and repeat every 5 minutes x 2 if discomfort not relieved 08/06/21   Julieanne Manson, MD  ondansetron (ZOFRAN-ODT) 4 MG disintegrating tablet Take 1 tablet (4 mg total) by mouth every 8 (eight) hours as needed for nausea or vomiting. 09/16/22   Renne Crigler, PA-C  spironolactone (ALDACTONE) 25 MG tablet Take 1 tablet by mouth once daily 10/19/22   Orbie Pyo, MD  tirzepatide [facility]) 5 MG/0.5ML Pen Inject 5 mg into the skin once a week. For 4 weeks then 7.5 mg weekly 09/24/22   Julieanne Manson, MD  tirzepatide [facility]) 7.5 MG/0.5ML Pen Inject 7.5 mg into the skin once a week. Start after 4 weeks of 5 mg dosing 09/24/22   Julieanne Manson, MD      Allergies    Aspirin, Ibuprofen, and Ace inhibitors    Review of Systems   Review of Systems Review of systems completed and notable as per HPI.  ROS otherwise negative.   Physical Exam Updated Vital Signs BP 110/62 (BP Location: Left Arm)   Pulse 65   Temp 98.5 F (36.9 C) (Oral)   Resp 16   Wt (!) 143.3 kg   SpO2 94%   BMI 45.34 kg/m  Physical Exam Vitals and nursing note reviewed.  Constitutional:      General: She is not in acute distress.    Appearance: She is well-developed.  HENT:     Head: Normocephalic and atraumatic.     Nose: No congestion.     Mouth/Throat:     Mouth: Mucous membranes are moist.     Pharynx: Uvula midline. Posterior oropharyngeal erythema present.     Tonsils: Tonsillar exudate present. No tonsillar abscesses.  Eyes:     Conjunctiva/sclera: Conjunctivae normal.  Cardiovascular:     Rate and Rhythm: Normal rate and regular rhythm.     Pulses: Normal pulses.     Heart sounds: Normal heart sounds. No murmur heard. Pulmonary:      Effort: Pulmonary effort is normal. No respiratory distress.     Breath sounds: Normal breath sounds.  Abdominal:     Palpations: Abdomen is soft.     Tenderness: There is no abdominal tenderness.  Musculoskeletal:        General: No swelling.     Cervical back: Neck supple.     Right lower leg: No edema.     Left lower leg: No edema.  Skin:    General: Skin is warm and dry.     Capillary Refill: Capillary refill takes less than 2 seconds.  Neurological:     Mental Status: She is alert.  Psychiatric:        Mood and Affect: Mood normal.     ED  Results / Procedures / Treatments   Labs (all labs ordered are listed, but only abnormal results are displayed) Labs Reviewed  CBC WITH DIFFERENTIAL/PLATELET - Abnormal; Notable for the following components:      Result Value   WBC 11.1 (*)    MCH 25.5 (*)    Neutro Abs 7.8 (*)    All other components within normal limits  BASIC METABOLIC PANEL - Abnormal; Notable for the following components:   Creatinine, Ser 1.41 (*)    GFR, Estimated 44 (*)    All other components within normal limits  GROUP A STREP BY PCR    EKG None  Radiology CT Soft Tissue Neck W Contrast  Result Date: 12/03/2022 CLINICAL DATA:  Provided history: Epiglottitis or tonsillitis suspected. Additional history provided: Sore throat, difficulty swallowing, history of parathyroidectomy. EXAM: CT NECK WITH CONTRAST TECHNIQUE: Multidetector CT imaging of the neck was performed using the standard protocol following the bolus administration of intravenous contrast. RADIATION DOSE REDUCTION: This exam was performed according to the departmental dose-optimization program which includes automated exposure control, adjustment of the mA and/or kV according to patient size and/or use of iterative reconstruction technique. CONTRAST:  80mL OMNIPAQUE IOHEXOL 300 MG/ML  SOLN COMPARISON:  Parathyroid scintigraphy SPECT imaging 03/08/2019. FINDINGS: Pharynx and larynx: Symmetric  prominence of the palatine tonsils. Multiple small calcific foci in the bilateral palatine tonsils, which may reflect postinflammatory calcifications and/or tonsilloliths. No appreciable swelling or mass elsewhere within the oral cavity, pharynx or larynx. No evidence of a tonsillar/peritonsillar abscess. No more than mild effacement of the oropharyngeal airway. No retropharyngeal collection. Salivary glands: No inflammation, mass, or stone. Thyroid: Subcentimeter thyroid nodules not meeting consensus criteria for ultrasound follow-up based on size. No follow-up imaging recommended. Reference: J Am Coll Radiol. 2015 Feb;12(2): 143-50. Surgical clips along the posterolateral aspect of the left thyroid lobe. Lymph nodes: Nonspecific mildly enlarged right level II lymph node (measuring 16 mm in short axis. No pathologically enlarged lymph nodes identified elsewhere within the neck. Vascular: The major vascular structures of the neck are patent. Mild atherosclerotic plaque within the proximal right ICA. Limited intracranial: No evidence of an acute intracranial abnormality within the field of view. Visualized orbits: No orbital mass or acute orbital finding Mastoids and visualized paranasal sinuses: Portions of the frontal sinus are excluded from the field of view superiorly. Mild mucosal thickening, and small-volume fluid, scattered within bilateral ethmoid air cells. Trace mucosal thickening within the bilateral sphenoid sinuses. Mild mucosal thickening within the right maxillary sinus. Small to moderate-sized fluid level, and background moderate-to-severe mucosal thickening, within the left maxillary sinus. No significant mastoid effusion. Skeleton: Spondylosis at the cervical and visualized upper thoracic levels. Upper chest: No consolidation within the imaged lung apices. IMPRESSION: 1. Symmetric prominence of the palatine tonsils, compatible with acute tonsillitis in the appropriate clinical setting. No evidence  of a tonsillar/peritonsillar abscess. No more than mild effacement of the oropharyngeal airway. 2. Small calcific foci within the bilateral palatine tonsils, which may reflect postinflammatory calcifications and/or tonsilloliths. 3. Mildly enlarged right level II lymph node, favored reactive in etiology. Clinical follow-up recommended (with imaging follow-up if warranted). 4. Paranasal sinus disease as described. Electronically Signed   By: Jackey Loge D.O.   On: 12/03/2022 13:20    Procedures Procedures    Medications Ordered in ED Medications  acetaminophen (TYLENOL) tablet 650 mg (650 mg Oral Given 12/03/22 0834)  morphine (PF) 4 MG/ML injection 4 mg (4 mg Intravenous Given 12/03/22 1000)  iohexol (OMNIPAQUE)  300 MG/ML solution 100 mL (80 mLs Intravenous Contrast Given 12/03/22 1054)  oxyCODONE-acetaminophen (PERCOCET/ROXICET) 5-325 MG per tablet 1 tablet (1 tablet Oral Given 12/03/22 1318)  amoxicillin-clavulanate (AUGMENTIN) 875-125 MG per tablet 1 tablet (1 tablet Oral Given 12/03/22 1435)  predniSONE (DELTASONE) tablet 40 mg (40 mg Oral Given 12/03/22 1438)    ED Course/ Medical Decision Making/ A&P Clinical Course as of 12/03/22 2003  Thu Dec 03, 2022  1407 CT scan reviewed, consistent with tonsillitis but no signs of PTA or RPA.  On reevaluation pain is improved.  Still having some pain with swallowing but able to tolerate her pills.  She is a type II diabetic, is on insulin and Mounjaro.  She is very well-controlled.  I discussed possible trying some steroids to help with the swelling.  I discussed possible hypoglycemia that can develop with this.  Given she is very well-controlled should like to try a course of prednisone.  I told her to keep track of her blood sugar and stop if it is causing severe hyperglycemia.  I recommended Tylenol for pain will give short prescription of oxycodone to help as well.  Strict turn precautions given.  Encourage p.o. and hydration. [JD]    Clinical Course  User Index [JD] Laurence Spates, MD                                 Medical Decision Making Amount and/or Complexity of Data Reviewed Labs: ordered. Radiology: ordered.  Risk OTC drugs. Prescription drug management.   Medical Decision Making:   Rebecca Avila is a 57 y.o. female who presented to the ED today with sore throat, pain with swallowing.  No signs reviewed.  She reports recent symptoms likely to URI.  Her cough and congestion have improved with no chest pain or shortness of breath.  No clinical signs of pneumonia.  She does have bilateral tonsillar swelling, erythema, exudate consistent with likely tonsillitis.  I do not see a clear evidence of abscess, however given some pain with swallowing, voice change obtain CT scan evaluate for possible PTA, RPA.   Patient placed on continuous vitals and telemetry monitoring while in ED which was reviewed periodically.  Reviewed and confirmed nursing documentation for past medical history, family history, social history.  Reassessment and Plan:   CT scan reviewed, consistent with tonsillitis but no signs of PTA or RPA.  On reevaluation pain is improved.  Still having some pain with swallowing but able to tolerate her pills.  She is a type II diabetic, is on insulin and Mounjaro.  She is very well-controlled.  I discussed possible trying some steroids to help with the swelling.  I discussed possible hyperglycemia that can develop with this.  Given she is very well-controlled should like to try a course of prednisone.  I told her to keep track of her blood sugar and stop if it is causing severe hyperglycemia.  I recommended Tylenol for pain will give short prescription of oxycodone to help as well.  Strict turn precautions given.  Encourage p.o. and hydration.  Antibiotic prescription sent as well.   Patient's presentation is most consistent with acute complicated illness / injury requiring diagnostic workup.           Final Clinical  Impression(s) / ED Diagnoses Final diagnoses:  Tonsillitis    Rx / DC Orders ED Discharge Orders  Ordered    oxyCODONE (ROXICODONE) 5 MG immediate release tablet  Every 6 hours PRN        12/03/22 1410    predniSONE (DELTASONE) 20 MG tablet  Daily        12/03/22 1410    amoxicillin-clavulanate (AUGMENTIN) 875-125 MG tablet  Every 12 hours        12/03/22 1410              Laurence Spates, MD 12/03/22 2003

## 2022-12-03 NOTE — Discharge Instructions (Signed)
You have an infection of your tonsils.  Your CT scan did not show any evidence of abscess.  You should take the entire course of prescribed antibiotics.  I wrote you a short prescription for oxycodone that you can take in addition to Tylenol.  Do not drive while taking this as it may make you drowsy and it also may make you constipated.  We decided to try a short course of steroids.  You should take 1 dose daily.  If you notice your blood sugars becoming higher you should stop the steroids.  You should follow close with your primary care doctor.  If you develop severe pain, difficulty breathing, inability to swallow you should return to the ED.

## 2022-12-03 NOTE — ED Notes (Signed)
Pt given discharge instructions and reviewed prescriptions. Opportunities given for questions. Pt verbalizes understanding. PIV removed x1. Stone,Heather R, RN 

## 2022-12-06 ENCOUNTER — Other Ambulatory Visit: Payer: Self-pay | Admitting: Internal Medicine

## 2022-12-21 NOTE — Progress Notes (Signed)
This encounter was created in error - please disregard.

## 2023-01-22 ENCOUNTER — Ambulatory Visit: Payer: 59 | Admitting: Cardiology

## 2023-01-29 ENCOUNTER — Telehealth: Payer: Self-pay

## 2023-01-29 NOTE — Telephone Encounter (Signed)
 Patient's CPE appointment was rescheduled.  Patient would like to be on wait list for a sooner appointment.  We will call patient if there is a cancellation.

## 2023-02-05 ENCOUNTER — Other Ambulatory Visit: Payer: 59

## 2023-02-09 ENCOUNTER — Encounter: Payer: 59 | Admitting: Internal Medicine

## 2023-02-11 ENCOUNTER — Ambulatory Visit (INDEPENDENT_AMBULATORY_CARE_PROVIDER_SITE_OTHER): Payer: 59 | Admitting: Internal Medicine

## 2023-02-11 ENCOUNTER — Encounter: Payer: Self-pay | Admitting: Internal Medicine

## 2023-02-11 ENCOUNTER — Other Ambulatory Visit: Payer: Self-pay | Admitting: Internal Medicine

## 2023-02-11 VITALS — BP 112/68 | HR 68 | Resp 20 | Ht 70.0 in | Wt 324.0 lb

## 2023-02-11 DIAGNOSIS — Z0000 Encounter for general adult medical examination without abnormal findings: Secondary | ICD-10-CM

## 2023-02-11 DIAGNOSIS — E042 Nontoxic multinodular goiter: Secondary | ICD-10-CM

## 2023-02-11 DIAGNOSIS — N189 Chronic kidney disease, unspecified: Secondary | ICD-10-CM | POA: Insufficient documentation

## 2023-02-11 DIAGNOSIS — Z6841 Body Mass Index (BMI) 40.0 and over, adult: Secondary | ICD-10-CM

## 2023-02-11 DIAGNOSIS — N182 Chronic kidney disease, stage 2 (mild): Secondary | ICD-10-CM

## 2023-02-11 DIAGNOSIS — Z794 Long term (current) use of insulin: Secondary | ICD-10-CM

## 2023-02-11 DIAGNOSIS — E669 Obesity, unspecified: Secondary | ICD-10-CM | POA: Insufficient documentation

## 2023-02-11 DIAGNOSIS — Z23 Encounter for immunization: Secondary | ICD-10-CM | POA: Diagnosis not present

## 2023-02-11 DIAGNOSIS — M85851 Other specified disorders of bone density and structure, right thigh: Secondary | ICD-10-CM

## 2023-02-11 DIAGNOSIS — E66813 Obesity, class 3: Secondary | ICD-10-CM

## 2023-02-11 DIAGNOSIS — E1169 Type 2 diabetes mellitus with other specified complication: Secondary | ICD-10-CM

## 2023-02-11 DIAGNOSIS — Z1231 Encounter for screening mammogram for malignant neoplasm of breast: Secondary | ICD-10-CM

## 2023-02-11 DIAGNOSIS — Z1211 Encounter for screening for malignant neoplasm of colon: Secondary | ICD-10-CM

## 2023-02-11 DIAGNOSIS — E1165 Type 2 diabetes mellitus with hyperglycemia: Secondary | ICD-10-CM | POA: Diagnosis not present

## 2023-02-11 DIAGNOSIS — E785 Hyperlipidemia, unspecified: Secondary | ICD-10-CM

## 2023-02-11 MED ORDER — TIRZEPATIDE 7.5 MG/0.5ML ~~LOC~~ SOAJ: 7.5000 mg | SUBCUTANEOUS | 3 refills | Status: DC

## 2023-02-11 NOTE — Telephone Encounter (Signed)
Patient has been schedule.

## 2023-02-11 NOTE — Progress Notes (Signed)
Subjective:    Patient ID: Rebecca Avila, female   DOB: 09/24/1965, 58 y.o.   MRN: [id number]   HPI  CPE without pap  1.  Pap:  Normal before total vaginal hysterectomy in 2007  2.  Mammogram:  Last 10/2022 and normal.  No family history of breast cancer.    3.  Osteoprevention:  History of osteopenia in 2021.  This is when she was diagnosed with primary hyperparathyroidism.  Taking Calcium and Vitamin D.  History of elevated vitamin D with Hyperparathyroidism.  Surgery to remove adenoma was in 2022.    4.  Guaiac Cards/FIT:  Has never returned  5.  Colonoscopy:  Never.  No family history of colon cancer.    6.  Immunizations:  Needs Pneumococcal 20, Shingrix, COVID, Influenza  Immunization History  Administered Date(s) Administered   PFIZER Comirnaty(Gray Top)Covid-19 Tri-Sucrose Vaccine 06/05/2020   PFIZER(Purple Top)SARS-COV-2 Vaccination 09/25/2019, 10/24/2019   Pfizer Covid-19 Vaccine Bivalent Booster 70yrs & up 10/18/2020   Pneumococcal Polysaccharide-23 11/06/2019   Td 11/06/2019     7.  Glucose/Cholesterol:  last A1C in August was 7.4%.  Cholesterol at goal save for HDL 1 year ago.   Lipid Panel     Component Value Date/Time   CHOL 116 02/05/2022 0930   TRIG 83 02/05/2022 0930   HDL 38 (L) 02/05/2022 0930   CHOLHDL 3.1 02/05/2022 0930   LDLCALC 61 02/05/2022 0930   LABVLDL 17 02/05/2022 0930     Current Meds  Medication Sig   acetaminophen (TYLENOL) 500 MG tablet 2 tabs by mouth twice daily (Patient taking differently: Take 1,000 mg by mouth every 6 (six) hours as needed for moderate pain (pain score 4-6).)   amLODipine (NORVASC) 10 MG tablet Take 1 tablet (10 mg total) by mouth daily.   atorvastatin (LIPITOR) 40 MG tablet TAKE 1 TABLET BY MOUTH ONCE DAILY *STOP  ZOCOR*   blood glucose meter kit and supplies Dispense based on patient and insurance preference. Use up to four times daily as directed. (FOR ICD-10 E10.9, E11.9).   Blood Glucose Monitoring  Suppl (ONETOUCH VERIO) w/Device KIT Check blood sugars twice daily before meals   Calcium Carb-Cholecalciferol (CALCIUM 500+D3) 500-400 MG-UNIT TABS 1 tab by mouth twice daily (Patient taking differently: Take 1 tablet by mouth daily. 1 tab by mouth twice daily)   clopidogrel (PLAVIX) 75 MG tablet Take 1 tablet by mouth once daily   Continuous Blood Gluc Sensor (FREESTYLE LIBRE 3 SENSOR) MISC Place 1 sensor on the skin every 14 days. Use to check glucose continuously   empagliflozin (JARDIANCE) 10 MG TABS tablet TAKE 1 TABLET BY MOUTH ONCE DAILY BEFORE BREAKFAST   EPINEPHrine (EPIPEN 2-PAK) 0.3 mg/0.3 mL IJ SOAJ injection Inject 0.3 mLs (0.3 mg total) into the muscle as needed for anaphylaxis.   ezetimibe (ZETIA) 10 MG tablet Take 1 tablet by mouth once daily   glucose blood (ONETOUCH VERIO) test strip Check blood glucose twice daily before meals   Insulin Pen Needle (RELION PEN NEEDLES) 31G X 6 MM MISC USE 1  ONCE DAILY   Lancets (ONETOUCH ULTRASOFT) lancets Check blood sugar twice daily before meals   LANTUS SOLOSTAR 100 UNIT/ML Solostar Pen 20 units injected subcutaneously before breakfast in morning.   losartan-hydrochlorothiazide (HYZAAR) 100-25 MG tablet Take 1 tablet by mouth once daily   metoprolol succinate (TOPROL-XL) 100 MG 24 hr tablet TAKE 1 TABLET BY MOUTH ONCE DAILY WITH OR FOLLOWING A MEAL   nitroGLYCERIN (NITROSTAT) 0.4 MG  SL tablet 1 tab under tongue as needed for Chest discomfort and repeat every 5 minutes x 2 if discomfort not relieved   spironolactone (ALDACTONE) 25 MG tablet Take 1 tablet by mouth once daily   tirzepatide (MOUNJARO) 7.5 MG/0.5ML Pen Inject 7.5 mg into the skin once a week. Start after 4 weeks of 5 mg dosing   Allergies  Allergen Reactions   Aspirin Anaphylaxis   Ibuprofen Anaphylaxis   Ace Inhibitors Cough   Past Medical History:  Diagnosis Date   Anemia    Anxiety    Chest pain 2023   Was seen and now followed by Dr Lynnette Caffey 07/2021.  echo normal,  never went for cardiac CT.  Chest pain resolved following medication changes for BP, Plavix as ASA allergy and DM med changes.   CKD (chronic kidney disease)    Depression    Diabetes mellitus without complication (HCC)    type 2    Environmental allergies    Hyperlipidemia    Hypertension    Obesity    Osteopenia 2021   associated with primary hyperparathyroidism   Primary hyperparathyroidism (HCC) 2021   Hypercalcemia from 2014 with significant increase in 2020 and elevated PTH.  Left parathyroid adenoma excised in 2022   Sciatica    Past Surgical History:  Procedure Laterality Date   PARATHYROIDECTOMY Left 03/29/2020   Procedure: LEFT INFERIOR PARATHYROIDECTOMY;  Surgeon: Darnell Level, MD;  Location: [facility];  Service: General;  Laterality: Left;  ROOM 1 STARTING AT 12:00PM FOR 90 MIN   TOTAL VAGINAL HYSTERECTOMY  2007   For benign reasons--fibroids   Family History  Problem Relation Age of Onset   Lung cancer Mother        Heavy smoker   Lung cancer Father        Not sure if he also died of lung CA--also a smoker   Hypertension Father    Cancer Brother        congenital tumor of spine--malignant   Mental illness Brother        She does not know his diagnosis   High blood pressure Paternal Aunt    High blood pressure Paternal Grandmother    Diabetes Neg Hx    Thyroid disease Neg Hx    Social History   Socioeconomic History   Marital status: Married    Spouse name: Molly Maduro   Number of children: 1   Years of education: Not on file   Highest education level: Associate degree: academic program  Occupational History   Not on file  Tobacco Use   Smoking status: Former    Current packs/day: 0.00    Average packs/day: 0.3 packs/day for 28.2 years (7.0 ttl pk-yrs)    Types: Cigarettes    Start date: 01/27/1992    Quit date: 03/29/2020    Years since quitting: 2.8    Passive exposure: Past   Smokeless tobacco: Never   Tobacco comments:    once monthly  Vaping Use    Vaping status: Never Used  Substance and Sexual Activity   Alcohol use: Yes    Comment: occ   Drug use: Never   Sexual activity: Yes    Birth control/protection: Surgical  Other Topics Concern   Not on file  Social History Narrative   Lives with husband   Daughter lives in [place]   Social Drivers of Health   Financial Resource Strain: Not on file  Food Insecurity: No Food Insecurity (02/11/2023)   Hunger Vital Sign  Worried About Programme researcher, broadcasting/film/video in the Last Year: Never true    Ran Out of Food in the Last Year: Never true  Transportation Needs: No Transportation Needs (02/11/2023)   PRAPARE - Administrator, Civil Service (Medical): No    Lack of Transportation (Non-Medical): No  Physical Activity: Not on file  Stress: Not on file  Social Connections: Unknown (06/10/2021)   [facility]   Social Network    Social Network: Not on file  Intimate Partner Violence: Not At Risk (02/11/2023)   Humiliation, Afraid, Rape, and Kick questionnaire    Fear of Current or Ex-Partner: No    Emotionally Abused: No    Physically Abused: No    Sexually Abused: No      Review of Systems  HENT:  Negative for dental problem (Does not have dental care).   Eyes:  Negative for visual disturbance (Has not had diabetic eye check in years.).  Respiratory:  Negative for shortness of breath.   Cardiovascular:  Negative for chest pain, palpitations and leg swelling.  Gastrointestinal:  Negative for abdominal pain and blood in stool (No melena).      Objective:   BP 112/68 (BP Location: Right Arm, Patient Position: Sitting, Cuff Size: Normal)   Pulse 68   Resp 20   Ht 5\' 10"  (1.778 m)   Wt (!) 324 lb (147 kg)   BMI 46.49 kg/m   Physical Exam Constitutional:      Appearance: She is obese.  HENT:     Head: Normocephalic and atraumatic.     Right Ear: Tympanic membrane, ear canal and external ear normal.     Left Ear: Tympanic membrane, ear canal  and external ear normal.     Nose: Nose normal.     Mouth/Throat:     Mouth: Mucous membranes are moist.     Pharynx: Oropharynx is clear.  Eyes:     Extraocular Movements: Extraocular movements intact.     Conjunctiva/sclera: Conjunctivae normal.     Pupils: Pupils are equal, round, and reactive to light.     Comments: Discs sharp  Neck:     Thyroid: Thyromegaly present. No thyroid mass.  Cardiovascular:     Rate and Rhythm: Normal rate and regular rhythm.     Pulses:          Dorsalis pedis pulses are 2+ on the right side and 2+ on the left side.       Posterior tibial pulses are 2+ on the right side and 2+ on the left side.     Heart sounds: S1 normal and S2 normal. No murmur heard.    No friction rub. No S3 or S4 sounds.     Comments: No carotid bruits.  Carotid, radial, femoral, DP and PT pulses normal and equal.   Pulmonary:     Effort: Pulmonary effort is normal.     Breath sounds: Normal breath sounds and air entry.  Chest:  Breasts:    Right: No inverted nipple, mass or nipple discharge.     Left: No inverted nipple, mass or nipple discharge.  Abdominal:     General: Bowel sounds are normal.     Palpations: Abdomen is soft. There is no hepatomegaly, splenomegaly or mass.     Tenderness: There is no abdominal tenderness.     Hernia: No hernia is present.  Genitourinary:    General: Normal vulva.     Comments: No palpable  cervix. No adnexal mass or tenderness. Musculoskeletal:        General: Normal range of motion.     Cervical back: Normal range of motion and neck supple.     Right lower leg: 1+ Edema present.     Left lower leg: 1+ Edema present.  Feet:     Right foot:     Protective Sensation: 10 sites tested.  10 sites sensed.     Skin integrity: Dry skin present.     Toenail Condition: Right toenails are abnormally thick.     Left foot:     Protective Sensation: 10 sites tested.  10 sites sensed.     Skin integrity: Dry skin present.     Toenail  Condition: Left toenails are abnormally thick.  Lymphadenopathy:     Head:     Right side of head: No submental or submandibular adenopathy.     Left side of head: No submental or submandibular adenopathy.     Cervical: No cervical adenopathy.     Upper Body:     Right upper body: No supraclavicular or axillary adenopathy.     Left upper body: No supraclavicular or axillary adenopathy.     Lower Body: No right inguinal adenopathy. No left inguinal adenopathy.  Skin:    General: Skin is warm.     Capillary Refill: Capillary refill takes less than 2 seconds.  Neurological:     General: No focal deficit present.     Mental Status: She is alert and oriented to person, place, and time.     Cranial Nerves: Cranial nerves 2-12 are intact.     Sensory: Sensation is intact.     Motor: Motor function is intact.     Coordination: Coordination is intact.     Gait: Gait is intact.     Deep Tendon Reflexes: Reflexes are normal and symmetric.  Psychiatric:        Mood and Affect: Mood normal.        Speech: Speech normal.        Behavior: Behavior normal. Behavior is cooperative.      Assessment & Plan   CPE without pap Pneumovax 20 Declined influenza and Shingrix and unlikely to obtain COVID elsewhere. Return for fasting labs next week Referral again for screening colonoscopy--promises to follow through  2.  DM and obesity:  She feels she has made significant changes with diet and reportedly continues on Mounjaro, but weight climbing.   Has had difficulty following up this past year.  Encouraged getting outside and gradual increase of physical activity.  Referral to diabetes and nutrition counseling.   A1C, urine microalbumin in next week.   Will see if needs increase in [place].   Eye referral to Dr Dione Booze.  3.  Hypertension:  Good control.  Losartan/hydrochlorothiazide, Aldactone, Toprol  4.  Chest pain:  never completed work up with cardiology.  Decision to treat medically as  symptoms resolved with med changes.  She thinks she is out of Plavix and will get back with [place] tomorrow if so.  Taking in place of ASA to which she has allergy.    5.  Osteopenia in 2021 at time of diagnosis of primary hyperparathyroidism:  adenoma excised in 2022.  Recheck bone density this year with mammogram at breast center.    6.  Multinodular goiter with history of benign FNA in 2021.  Recheck TSH and [place] of thyroid

## 2023-02-17 ENCOUNTER — Other Ambulatory Visit: Payer: 59

## 2023-02-19 ENCOUNTER — Telehealth: Payer: Self-pay

## 2023-02-19 ENCOUNTER — Ambulatory Visit
Admission: RE | Admit: 2023-02-19 | Discharge: 2023-02-19 | Disposition: A | Discharge status: Home / Self Care | Payer: 59 | Source: Ambulatory Visit | Attending: Internal Medicine | Admitting: Internal Medicine

## 2023-02-19 ENCOUNTER — Other Ambulatory Visit (INDEPENDENT_AMBULATORY_CARE_PROVIDER_SITE_OTHER): Payer: 59

## 2023-02-19 ENCOUNTER — Other Ambulatory Visit: Payer: Self-pay

## 2023-02-19 DIAGNOSIS — E1169 Type 2 diabetes mellitus with other specified complication: Secondary | ICD-10-CM

## 2023-02-19 DIAGNOSIS — Z0000 Encounter for general adult medical examination without abnormal findings: Secondary | ICD-10-CM

## 2023-02-19 DIAGNOSIS — Z794 Long term (current) use of insulin: Secondary | ICD-10-CM

## 2023-02-19 DIAGNOSIS — Z79899 Other long term (current) drug therapy: Secondary | ICD-10-CM

## 2023-02-19 DIAGNOSIS — E042 Nontoxic multinodular goiter: Secondary | ICD-10-CM

## 2023-02-19 DIAGNOSIS — E1165 Type 2 diabetes mellitus with hyperglycemia: Secondary | ICD-10-CM | POA: Diagnosis not present

## 2023-02-19 DIAGNOSIS — E785 Hyperlipidemia, unspecified: Secondary | ICD-10-CM

## 2023-02-19 MED ORDER — CYCLOBENZAPRINE HCL 10 MG PO TABS: ORAL_TABLET | ORAL | 0 refills | Status: AC

## 2023-02-19 NOTE — Telephone Encounter (Signed)
Patient has lower back pain and left shoulder pain. Patient notified of Rx refill.

## 2023-02-19 NOTE — Telephone Encounter (Signed)
Patient would like refill on flexeril

## 2023-02-20 LAB — COMPREHENSIVE METABOLIC PANEL
ALT: 11 [IU]/L (ref 0–32)
AST: 14 [IU]/L (ref 0–40)
Albumin: 4.1 g/dL (ref 3.8–4.9)
Alkaline Phosphatase: 97 [IU]/L (ref 44–121)
BUN/Creatinine Ratio: 17 (ref 9–23)
BUN: 19 mg/dL (ref 6–24)
Bilirubin Total: 0.3 mg/dL (ref 0.0–1.2)
CO2: 24 mmol/L (ref 20–29)
Calcium: 9.8 mg/dL (ref 8.7–10.2)
Chloride: 98 mmol/L (ref 96–106)
Creatinine, Ser: 1.12 mg/dL — ABNORMAL HIGH (ref 0.57–1.00)
Globulin, Total: 3 g/dL (ref 1.5–4.5)
Glucose: 93 mg/dL (ref 70–99)
Potassium: 4 mmol/L (ref 3.5–5.2)
Sodium: 140 mmol/L (ref 134–144)
Total Protein: 7.1 g/dL (ref 6.0–8.5)
eGFR: 58 mL/min/{1.73_m2} — ABNORMAL LOW (ref >59)

## 2023-02-20 LAB — CBC WITH DIFFERENTIAL/PLATELET
Basophils Absolute: 0.1 10*3/uL (ref 0.0–0.2)
Basos: 1 %
EOS (ABSOLUTE): 0.2 10*3/uL (ref 0.0–0.4)
Eos: 3 %
Hematocrit: 42.3 % (ref 34.0–46.6)
Hemoglobin: 13.4 g/dL (ref 11.1–15.9)
Immature Grans (Abs): 0 10*3/uL (ref 0.0–0.1)
Immature Granulocytes: 0 %
Lymphocytes Absolute: 2.2 10*3/uL (ref 0.7–3.1)
Lymphs: 27 %
MCH: 25.3 pg — ABNORMAL LOW (ref 26.6–33.0)
MCHC: 31.7 g/dL (ref 31.5–35.7)
MCV: 80 fL (ref 79–97)
Monocytes Absolute: 0.3 10*3/uL (ref 0.1–0.9)
Monocytes: 4 %
Neutrophils Absolute: 5.4 10*3/uL (ref 1.4–7.0)
Neutrophils: 65 %
Platelets: 380 10*3/uL (ref 150–450)
RBC: 5.29 x10E6/uL — ABNORMAL HIGH (ref 3.77–5.28)
RDW: 13.8 % (ref 11.7–15.4)
WBC: 8.2 10*3/uL (ref 3.4–10.8)

## 2023-02-20 LAB — MICROALBUMIN / CREATININE URINE RATIO
Creatinine, Urine: 56.8 mg/dL
Microalb/Creat Ratio: <5 mg/g{creat} (ref 0–29)
Microalbumin, Urine: <3 ug/mL

## 2023-02-20 LAB — LIPID PANEL W/O CHOL/HDL RATIO
Cholesterol, Total: 113 mg/dL (ref 100–199)
HDL: 37 mg/dL — ABNORMAL LOW (ref >39)
LDL Chol Calc (NIH): 60 mg/dL (ref 0–99)
Triglycerides: 79 mg/dL (ref 0–149)
VLDL Cholesterol Cal: 16 mg/dL (ref 5–40)

## 2023-02-20 LAB — TSH: TSH: 2.33 u[IU]/mL (ref 0.450–4.500)

## 2023-02-20 LAB — HEMOGLOBIN A1C
Est. average glucose Bld gHb Est-mCnc: 134 mg/dL
Hgb A1c MFr Bld: 6.3 % — ABNORMAL HIGH (ref 4.8–5.6)

## 2023-02-20 LAB — VITAMIN D 25 HYDROXY (VIT D DEFICIENCY, FRACTURES): Vit D, 25-Hydroxy: 10.5 ng/mL — ABNORMAL LOW (ref 30.0–100.0)

## 2023-02-23 ENCOUNTER — Other Ambulatory Visit: Payer: Self-pay | Admitting: Internal Medicine

## 2023-02-23 ENCOUNTER — Encounter: Payer: Self-pay | Admitting: Internal Medicine

## 2023-02-23 MED ORDER — VITAMIN D3 25 MCG (1000 UT) PO CAPS: 1000.0000 [IU] | ORAL_CAPSULE | Freq: Every day | ORAL | 11 refills | Status: AC

## 2023-02-23 MED ORDER — CALCIUM CITRATE 250 MG PO TABS: ORAL_TABLET | ORAL | 11 refills | Status: AC

## 2023-02-25 ENCOUNTER — Other Ambulatory Visit: Payer: Self-pay

## 2023-02-25 DIAGNOSIS — Z794 Long term (current) use of insulin: Secondary | ICD-10-CM

## 2023-02-25 MED ORDER — TIRZEPATIDE 10 MG/0.5ML ~~LOC~~ SOAJ: 10.0000 mg | SUBCUTANEOUS | 6 refills | Status: DC

## 2023-02-25 NOTE — Progress Notes (Signed)
Notify to increase to the 10 mg once weekly

## 2023-02-25 NOTE — Telephone Encounter (Signed)
See order for increased dose of Mounjaro at 10 mg weekly.  In orders for today

## 2023-02-26 NOTE — Telephone Encounter (Signed)
Patient has been notified of Rx change.

## 2023-03-13 ENCOUNTER — Other Ambulatory Visit: Payer: Self-pay | Admitting: Internal Medicine

## 2023-03-14 ENCOUNTER — Other Ambulatory Visit: Payer: Self-pay | Admitting: Internal Medicine

## 2023-03-14 ENCOUNTER — Encounter: Payer: Self-pay | Admitting: Internal Medicine

## 2023-03-14 DIAGNOSIS — E042 Nontoxic multinodular goiter: Secondary | ICD-10-CM

## 2023-03-15 ENCOUNTER — Other Ambulatory Visit: Payer: Self-pay | Admitting: Internal Medicine

## 2023-03-15 DIAGNOSIS — E042 Nontoxic multinodular goiter: Secondary | ICD-10-CM

## 2023-03-25 ENCOUNTER — Ambulatory Visit: Payer: 59 | Attending: Cardiology | Admitting: Cardiology

## 2023-03-25 ENCOUNTER — Encounter: Payer: Self-pay | Admitting: Cardiology

## 2023-03-25 VITALS — BP 124/84 | HR 57 | Ht 70.0 in | Wt 325.8 lb

## 2023-03-25 DIAGNOSIS — E1169 Type 2 diabetes mellitus with other specified complication: Secondary | ICD-10-CM | POA: Diagnosis not present

## 2023-03-25 DIAGNOSIS — R072 Precordial pain: Secondary | ICD-10-CM

## 2023-03-25 DIAGNOSIS — R002 Palpitations: Secondary | ICD-10-CM

## 2023-03-25 DIAGNOSIS — I10 Essential (primary) hypertension: Secondary | ICD-10-CM | POA: Diagnosis not present

## 2023-03-25 DIAGNOSIS — E119 Type 2 diabetes mellitus without complications: Secondary | ICD-10-CM | POA: Diagnosis not present

## 2023-03-25 DIAGNOSIS — E785 Hyperlipidemia, unspecified: Secondary | ICD-10-CM

## 2023-03-25 MED ORDER — EMPAGLIFLOZIN 10 MG PO TABS: 10.0000 mg | ORAL_TABLET | Freq: Every day | ORAL | 6 refills | Status: DC

## 2023-03-25 NOTE — Progress Notes (Signed)
 Cardiology Office Note:   Date:  03/25/2023  ID:  Rebecca Avila, Rebecca Avila [birth year], MRN [id number] PCP: Julieanne Manson, MD  [facility] Providers Cardiologist:  Orbie Pyo, MD    History of Present Illness:   Discussed the use of AI scribe software for clinical note transcription with the patient, who gave verbal consent to proceed.   Rebecca Avila is a 57 year old female with a history of type two diabetes managed with insulin, hypertension, hyperlipidemia, elevated BMI, stage two chronic kidney disease, and an aspirin allergy with anaphylaxis who presents for cardiology follow-up.  She was initially seen by cardiology in 2023 following PCP referral for evaluation of chest pain, which was intermittent and occurred both at rest and with exertion. CTA chest as well as an echo discussed. She has not undergone the cardiac CTA but did have an echocardiogram which showed a left ventricular ejection fraction of 55-60% with no regional wall motion abnormalities, but moderate concentric left ventricular hypertrophy was noted.  Since her initial cardiology evaluation, she has not experienced any chest pain. No shortness of breath, palpitations, or leg swelling. She engages in daily walking, achieving about 3,500 steps, and aims to increase this to 5,000 steps. She feels well during physical activity without any chest discomfort or shortness of breath.  Regarding her medication, she reports no issues with the cost of Jardiance due to a coupon, although a recent prescription was for a smaller quantity which increased the cost temporarily (didn't qualify for the coupon). She is on atorvastatin 40 mg daily and ezetimibe for hyperlipidemia, and reports no side effects. Her A1c is 6.3, and she is on [place] for diabetes management. Greggory Keen has helped adjust her appetite, contributing to dietary changes and weight management.  During her initial evaluation by cardiology, OSA  discussed as well as a sleep study. Today confirms that she has not had a sleep study and is unsure of the necessity given nightly restful sleep without daytime fatigue. She does report that her husband has commented that she snores.   We discussed family cardiac history, and she reports that her grandmother had a heart attack in her 25s. No significant family history of early coronary artery disease.      Today patient denies chest pain, shortness of breath, lower extremity edema, fatigue, palpitations, melena, hematuria, hemoptysis, diaphoresis, weakness, presyncope, syncope, orthopnea, and PND.   Studies Reviewed:    EKG:   EKG Interpretation Date/Time:  Thursday March 25 2023 16:14:01 EST Ventricular Rate:  57 PR Interval:  158 QRS Duration:  134 QT Interval:  474 QTC Calculation: 461 R Axis:   28  Text Interpretation: Sinus bradycardia with sinus arrhythmia Right bundle branch block When compared with ECG of 13-Sep-2021 14:07, PREVIOUS ECG IS PRESENT Confirmed by Perlie Gold [phone]) on 03/25/2023 4:32:37 PM    Cardiac Studies & Procedures   ______________________________________________________________________________________________     ECHOCARDIOGRAM  ECHOCARDIOGRAM COMPLETE 09/05/2021  Narrative ECHOCARDIOGRAM REPORT    Patient Name:   Rebecca Avila  Date of Exam: 09/05/2021 Medical Rec #:  [id number]     Height:       70.0 in Accession #:    [id number]    Weight:       332.4 lb Date of Birth:  Jun 11, 1965    BSA:          2.590 m Patient Age:    55 years      BP:  146/96 mmHg Patient Gender: F             HR:           50 bpm. Exam Location:  [address]  Procedure: 2D Echo, Cardiac Doppler and Color Doppler  Indications:    R07.9 Chest pain  History:        Patient has no prior history of Echocardiogram examinations. Signs/Symptoms:Chest Pain; Risk Factors:Morbid obesity, Diabetes, Hypertension and Dyslipidemia.  Sonographer:    Samule Ohm RDCS Referring Phys: [id number] Orbie Pyo  IMPRESSIONS   1. Marked bradycardia (43-45 bpm) limits evaluation of diastolic function. Left ventricular ejection fraction, by estimation, is 55 to 60%. The left ventricle has normal function. The left ventricle has no regional wall motion abnormalities. There is moderate concentric left ventricular hypertrophy. Left ventricular diastolic function could not be evaluated. 2. Right ventricular systolic function is normal. The right ventricular size is normal. There is normal pulmonary artery systolic pressure. The estimated right ventricular systolic pressure is 35.2 mmHg. 3. Left atrial size was mildly dilated. 4. The mitral valve is normal in structure. Trivial mitral valve regurgitation. No evidence of mitral stenosis. 5. Tricuspid valve regurgitation is mild to moderate. 6. The aortic valve is tricuspid. Aortic valve regurgitation is not visualized. No aortic stenosis is present. 7. The inferior vena cava is dilated in size with >50% respiratory variability, suggesting right atrial pressure of 8 mmHg.  FINDINGS Left Ventricle: Marked bradycardia (43-45 bpm) limits evaluation of diastolic function. Left ventricular ejection fraction, by estimation, is 55 to 60%. The left ventricle has normal function. The left ventricle has no regional wall motion abnormalities. The left ventricular internal cavity size was normal in size. There is moderate concentric left ventricular hypertrophy. Left ventricular diastolic function could not be evaluated due to nondiagnostic images. Left ventricular diastolic function could not be evaluated.  Right Ventricle: The right ventricular size is normal. No increase in right ventricular wall thickness. Right ventricular systolic function is normal. There is normal pulmonary artery systolic pressure. The tricuspid regurgitant velocity is 2.61 m/s, and with an assumed right atrial pressure of 8 mmHg, the estimated  right ventricular systolic pressure is 35.2 mmHg.  Left Atrium: Left atrial size was mildly dilated.  Right Atrium: Right atrial size was normal in size.  Pericardium: There is no evidence of pericardial effusion.  Mitral Valve: The mitral valve is normal in structure. Trivial mitral valve regurgitation. No evidence of mitral valve stenosis.  Tricuspid Valve: The tricuspid valve is normal in structure. Tricuspid valve regurgitation is mild to moderate. No evidence of tricuspid stenosis.  Aortic Valve: The aortic valve is tricuspid. Aortic valve regurgitation is not visualized. No aortic stenosis is present.  Pulmonic Valve: The pulmonic valve was normal in structure. Pulmonic valve regurgitation is mild. No evidence of pulmonic stenosis.  Aorta: The aortic root and ascending aorta are structurally normal, with no evidence of dilitation.  Venous: The inferior vena cava is dilated in size with greater than 50% respiratory variability, suggesting right atrial pressure of 8 mmHg.  IAS/Shunts: No atrial level shunt detected by color flow Doppler.   LEFT VENTRICLE PLAX 2D LVIDd:         5.40 cm   Diastology LVIDs:         3.50 cm   LV e' medial:    8.38 cm/s LV PW:         1.30 cm   LV E/e' medial:  14.0 LV IVS:  1.50 cm   LV e' lateral:   10.00 cm/s LVOT diam:     2.00 cm   LV E/e' lateral: 11.7 LV SV:         60 LV SV Index:   23 LVOT Area:     3.14 cm   RIGHT VENTRICLE             IVC RV S prime:     13.30 cm/s  IVC diam: 2.20 cm TAPSE (M-mode): 2.0 cm RVSP:           30.2 mmHg  LEFT ATRIUM             Index        RIGHT ATRIUM           Index LA diam:        4.20 cm 1.62 cm/m   RA Pressure: 3.00 mmHg LA Vol (A2C):   77.5 ml 29.92 ml/m  RA Area:     14.00 cm LA Vol (A4C):   78.2 ml 30.19 ml/m  RA Volume:   39.10 ml  15.09 ml/m LA Biplane Vol: 84.4 ml 32.58 ml/m AORTIC VALVE LVOT Vmax:   79.80 cm/s LVOT Vmean:  54.100 cm/s LVOT VTI:    0.191 m  AORTA Ao  Root diam: 3.20 cm Ao Asc diam:  3.50 cm  MITRAL VALVE                TRICUSPID VALVE MV Area (PHT): 3.28 cm     TR Peak grad:   27.2 mmHg MV Decel Time: 231 msec     TR Vmax:        261.00 cm/s MV E velocity: 117.00 cm/s  Estimated RAP:  3.00 mmHg MV A velocity: 86.50 cm/s   RVSP:           30.2 mmHg MV E/A ratio:  1.35 SHUNTS Systemic VTI:  0.19 m Systemic Diam: 2.00 cm  Mihai Croitoru MD Electronically signed by Thurmon Fair MD Signature Date/Time: 09/05/2021/2:39:29 PM    Final          ______________________________________________________________________________________________       Risk Assessment/Calculations:         STOP-Bang Score:  4       Physical Exam:   VS:  BP 124/84   Pulse (!) 57   Ht 5\' 10"  (1.778 m)   Wt (!) 325 lb 12.8 oz (147.8 kg)   SpO2 97%   BMI 46.75 kg/m    Wt Readings from Last 3 Encounters:  03/25/23 (!) 325 lb 12.8 oz (147.8 kg)  02/11/23 (!) 324 lb (147 kg)  12/03/22 (!) 316 lb (143.3 kg)     Physical Exam Vitals reviewed.  Constitutional:      Appearance: She is obese.  HENT:     Head: Normocephalic.     Nose: Nose normal.  Eyes:     Pupils: Pupils are equal, round, and reactive to light.  Cardiovascular:     Rate and Rhythm: Normal rate and regular rhythm.     Pulses: Normal pulses.     Heart sounds: Normal heart sounds. No murmur heard.    No friction rub. No gallop.  Pulmonary:     Effort: Pulmonary effort is normal.     Breath sounds: Normal breath sounds.  Abdominal:     General: Abdomen is flat.  Musculoskeletal:     Right lower leg: No edema.     Left lower leg: No edema.  Skin:  General: Skin is warm and dry.     Capillary Refill: Capillary refill takes less than 2 seconds.  Neurological:     General: No focal deficit present.     Mental Status: She is alert and oriented to person, place, and time.  Psychiatric:        Mood and Affect: Mood normal.        Behavior: Behavior normal.         Thought Content: Thought content normal.        Judgment: Judgment normal.     ASSESSMENT AND PLAN:       Type 2 Diabetes Improving with Jardiance and Mounjaro. No issues with cost of Jardiance. -Continue current regimen per PCP.  Hypertension Well controlled. No issues with amlodipine causing leg swelling. (She had previously discontinued because of this). -Continue Amlodipine 10mg , Hyzaar 100-25mg , Toprol XL 100mg , Spironolactone 25mg .  Hyperlipidemia LDL reasonably well controlled on atorvastatin and ezetimibe at 60mg /dL. -Continue Atorvastatin 40mg , Zetia 10mg .  Chest Pain No recurrence of chest pain since initial presentation in 2023. Echocardiogram showed normal ejection fraction and moderate concentric left ventricular hypertrophy. No cardiac CTA performed. -Continue statin and zetia as above. Will discuss need for ongoing Plavix with Dr. Lynnette Caffey.   CKD II Recent CMP with stable creatinine. -Continue Hyzaar and Jardiance.  Sleep Apnea Risk factors present but no symptoms. Discussed potential benefits of sleep study. -Patient to consider sleep study.  Weight Management Patient is actively working on weight loss through diet and exercise. -Continue current efforts.  Follow-up No new issues. Labs reassuring and blood pressure well controlled. -Annual follow-up unless new issues arise. -Notify patient regarding decision on Plavix.             Signed, Perlie Gold, PA-C

## 2023-03-25 NOTE — Patient Instructions (Signed)
 Medication Instructions:  Refilled Jardiance 10 mg   *If you need a refill on your cardiac medications before your next appointment, please call your pharmacy*  Follow-Up: At [facility], you and your health needs are our priority.  As part of our continuing mission to provide you with exceptional heart care, we have created designated Provider Care Teams.  These Care Teams include your primary Cardiologist (physician) and Advanced Practice Providers (APPs -  Physician Assistants and Nurse Practitioners) who all work together to provide you with the care you need, when you need it.  Your next appointment:   1 year(s)  Provider:   Orbie Pyo, MD     Other Instructions   1st Floor: - Lobby - Registration  - Pharmacy  - Lab - Cafe  2nd Floor: - PV Lab - Diagnostic Testing (echo, CT, nuclear med)  3rd Floor: - Vacant  4th Floor: - TCTS (cardiothoracic surgery) - AFib Clinic - Structural Heart Clinic - Vascular Surgery  - Vascular Ultrasound  5th Floor: - [facility] (general and EP) - Clinical Pharmacy for coumadin, hypertension, lipid, weight-loss medications, and med management appointments    Valet parking services will be available as well.

## 2023-03-26 ENCOUNTER — Telehealth: Payer: Self-pay | Admitting: Cardiology

## 2023-03-26 DIAGNOSIS — R072 Precordial pain: Secondary | ICD-10-CM

## 2023-03-26 NOTE — Telephone Encounter (Signed)
-----   Message from Orbie Pyo sent at 03/26/2023  6:45 AM EST ----- Regarding: RE: Medication question Calcium score ct.  Cor CTA won't get covered. ----- Message ----- From: Perlie Gold, Cordelia Poche Sent: 03/25/2023   8:20 PM EST To: Orbie Pyo, MD Subject: Medication question                            Hey Dr. Lynnette Caffey,  This is a patient you saw in clinic in 2023 for evaluation of chest pain. Given concern of CAD, you started her on Plavix (ASA allergy) and discussed CTA chest as well as echo. Patient only had the echo completed (normal) and has since lost weight, is feeling much better, and completely denies any recurrent chest pain. Would it be reasonable to stop the primary prevention Plavix without checking a cardiac CTA given lack of symptoms?  Perlie Gold, PA-C

## 2023-03-26 NOTE — Telephone Encounter (Signed)
 Called patient with Perlie Gold PA's advisement. Patient verbalized understanding.

## 2023-03-26 NOTE — Telephone Encounter (Signed)
   Please let patient know that I spoke with Dr. Lynnette Caffey who recommends that we check a calcium scoring cardiac CT before making a decision about stopping Plavix. This study will allow [place] to quantify coronary calcium burden and definitely assess need for an antiplatelet medicine such as Plavix longterm.  Perlie Gold, PA-C

## 2023-03-26 NOTE — Addendum Note (Signed)
 Addended by: Virl Axe, Shantoya Geurts L on: 03/26/2023 04:47 PM   Modules accepted: Orders

## 2023-04-07 ENCOUNTER — Other Ambulatory Visit: Payer: Self-pay | Admitting: Internal Medicine

## 2023-04-08 ENCOUNTER — Other Ambulatory Visit: Payer: Self-pay | Admitting: Internal Medicine

## 2023-04-13 ENCOUNTER — Encounter: Payer: Self-pay | Admitting: Internal Medicine

## 2023-04-13 ENCOUNTER — Ambulatory Visit (INDEPENDENT_AMBULATORY_CARE_PROVIDER_SITE_OTHER): Admitting: Internal Medicine

## 2023-04-13 ENCOUNTER — Encounter: Payer: Self-pay | Admitting: Dietician

## 2023-04-13 ENCOUNTER — Encounter: Payer: 59 | Attending: Internal Medicine | Admitting: Dietician

## 2023-04-13 VITALS — BP 120/76 | HR 69 | Temp 98.1°F | Resp 18 | Ht 70.0 in | Wt 324.7 lb

## 2023-04-13 VITALS — Wt 324.7 lb

## 2023-04-13 DIAGNOSIS — E119 Type 2 diabetes mellitus without complications: Secondary | ICD-10-CM | POA: Diagnosis present

## 2023-04-13 DIAGNOSIS — Z794 Long term (current) use of insulin: Secondary | ICD-10-CM | POA: Diagnosis present

## 2023-04-13 DIAGNOSIS — L03311 Cellulitis of abdominal wall: Secondary | ICD-10-CM

## 2023-04-13 MED ORDER — AMOXICILLIN-POT CLAVULANATE 875-125 MG PO TABS: 1.0000 | ORAL_TABLET | Freq: Two times a day (BID) | ORAL | 0 refills | Status: DC

## 2023-04-13 NOTE — Progress Notes (Signed)
 Diabetes Self-Management Education  Visit Type: First/Initial  Appt. Start Time: 0800 Appt. End Time: 0900  04/13/2023  [name], identified by name and date of birth, is a 58 y.o. female with a diagnosis of Diabetes: Type 2.   ASSESSMENT  History includes: anemia, anxiety, depression, type 2 diabetes, HLD, HTN, kidney disease, thyroid disease Labs noted: 02/19/23 A1c 6.3%, 12/03/22: GFR 44 Medications include: jardiance, lantus, mounjaro Supplements: vitamin D  Pt states she wants to learn how to eat better and wants to become more healthy. Pt reports she would also like to lose weight and potentially get off of one of her diabetes medications.   Pt states she tries to get her steps in at work. Pt reports she aims to get up every 2 hours and walk around the parking lot. Pt states on a good day she gets 5000 steps but wants to increase to 7000, and eventually 16109.  Pt states she eats at home more often than going out. Pt reports she cooks dinner and packs leftovers for lunch. Pt states she does not snack often.   Weight (!) 324 lb 11.2 oz (147.3 kg). Body mass index is 46.59 kg/m.   Diabetes Self-Management Education - 04/13/23 0759       Visit Information   Visit Type First/Initial      Initial Visit   Diabetes Type Type 2    Date Diagnosed 2013    Are you currently following a meal plan? No    Are you taking your medications as prescribed? Yes      Health Coping   How would you rate your overall health? Fair      Psychosocial Assessment   Patient Belief/Attitude about Diabetes Motivated to manage diabetes    What is the hardest part about your diabetes right now, causing you the most concern, or is the most worrisome to you about your diabetes?   Making healty food and beverage choices    Self-care barriers None    Self-management support Doctor's office    Other persons present Patient    Patient Concerns Nutrition/Meal planning    Special Needs None     Preferred Learning Style No preference indicated    Learning Readiness Ready    How often do you need to have someone help you when you read instructions, pamphlets, or other written materials from your doctor or pharmacy? 1 - Never    What is the last grade level you completed in school? college      Pre-Education Assessment   Patient understands the diabetes disease and treatment process. Needs Instruction    Patient understands incorporating nutritional management into lifestyle. Needs Instruction    Patient undertands incorporating physical activity into lifestyle. Needs Instruction    Patient understands using medications safely. Needs Instruction    Patient understands monitoring blood glucose, interpreting and using results Needs Instruction    Patient understands prevention, detection, and treatment of acute complications. Needs Instruction    Patient understands prevention, detection, and treatment of chronic complications. Needs Instruction    Patient understands how to develop strategies to address psychosocial issues. Needs Instruction    Patient understands how to develop strategies to promote health/change behavior. Needs Instruction      Complications   Last HgB A1C per patient/outside source 6.3 %    How often do you check your blood sugar? 3-4 times / week    Fasting Blood glucose range (mg/dL) 60-454    Have you had  a dilated eye exam in the past 12 months? No    Have you had a dental exam in the past 12 months? No    Are you checking your feet? Yes    How many days per week are you checking your feet? 7      Dietary Intake   Breakfast half bagel and cream cheese and coffee    Snack (morning) 5 cough drops    Lunch rice and vegetables and protein OR frozen meal    Snack (afternoon) 5 cough drops    Dinner 7pm: rice and vegetables and protein    Snack (evening) none    Beverage(s) 48-64 oz water, coffee with hersheys creamer, ginger ale 1-2 a week, 1 c lemonade daily       Activity / Exercise   Activity / Exercise Type Light (walking / raking leaves)    How many days per week do you exercise? 7    How many minutes per day do you exercise? 10    Total minutes per week of exercise 70      Patient Education   Previous Diabetes Education No    Disease Pathophysiology Definition of diabetes, type 1 and 2, and the diagnosis of diabetes;Explored patient's options for treatment of their diabetes    Healthy Eating Role of diet in the treatment of diabetes and the relationship between the three main macronutrients and blood glucose level;Plate Method;Reviewed blood glucose goals for pre and post meals and how to evaluate the patients' food intake on their blood glucose level.;Meal timing in regards to the patients' current diabetes medication.;Meal options for control of blood glucose level and chronic complications.    Being Active Identified with patient nutritional and/or medication changes necessary with exercise.    Medications Reviewed patients medication for diabetes, action, purpose, timing of dose and side effects.    Monitoring Identified appropriate SMBG and/or A1C goals.;Daily foot exams;Yearly dilated eye exam    Acute complications Taught prevention, symptoms, and  treatment of hypoglycemia - the 15 rule.;Discussed and identified patients' prevention, symptoms, and treatment of hyperglycemia.    Chronic complications Relationship between chronic complications and blood glucose control;Identified and discussed with patient  current chronic complications    Diabetes Stress and Support Identified and addressed patients feelings and concerns about diabetes;Worked with patient to identify barriers to care and solutions;Role of stress on diabetes    Lifestyle and Health Coping Lifestyle issues that need to be addressed for better diabetes care      Individualized Goals (developed by patient)   Nutrition General guidelines for healthy choices and portions  discussed;Follow meal plan discussed    Physical Activity Exercise 1-2 times per week;30 minutes per day    Medications take my medication as prescribed    Monitoring  Test my blood glucose as discussed    Problem Solving Eating Pattern    Reducing Risk examine blood glucose patterns;do foot checks daily;treat hypoglycemia with 15 grams of carbs if blood glucose less than 70mg /dL    Health Coping Ask for help with psychological, social, or emotional issues      Post-Education Assessment   Patient understands the diabetes disease and treatment process. Comprehends key points    Patient understands incorporating nutritional management into lifestyle. Comprehends key points    Patient undertands incorporating physical activity into lifestyle. Comprehends key points    Patient understands using medications safely. Comphrehends key points    Patient understands monitoring blood glucose, interpreting and using results Comprehends  key points    Patient understands prevention, detection, and treatment of acute complications. Comprehends key points    Patient understands prevention, detection, and treatment of chronic complications. Comprehends key points    Patient understands how to develop strategies to address psychosocial issues. Comprehends key points    Patient understands how to develop strategies to promote health/change behavior. Comprehends key points      Outcomes   Expected Outcomes Demonstrated interest in learning. Expect positive outcomes    Future DMSE 2 months    Program Status Not Completed             Individualized Plan for Diabetes Self-Management Training:   Learning Objective:  Patient will have a greater understanding of diabetes self-management. Patient education plan is to attend individual and/or group sessions per assessed needs and concerns.   Plan:   Patient Instructions  Goals Established by Patient:  Goal 1: Go to the gym at least once a week for 30  minutes and keep up walking at work.   Goal 2: swap to a sugar free creamer.   Expected Outcomes:  Demonstrated interest in learning. Expect positive outcomes  Education material provided: ADA - How to Thrive: A Guide for Your Journey with Diabetes and My Plate  If problems or questions, patient to contact team via:  Phone  Future DSME appointment: 2 months

## 2023-04-13 NOTE — Patient Instructions (Signed)
 Goals Established by Patient:  Goal 1: Go to the gym at least once a week for 30 minutes and keep up walking at work.   Goal 2: swap to a sugar free creamer.

## 2023-04-13 NOTE — Patient Instructions (Addendum)
 Warm compress/heating pad to abdominal wall infection 30 minutes after taking antibiotic twice daily --apply for 20 minutes.  Call if develop increasing pain, fever, redness or swelling or any GI symptoms

## 2023-04-13 NOTE — Progress Notes (Signed)
 Subjective:    Patient ID: Rebecca Avila, female   DOB: 09-01-1965, 58 y.o.   MRN: [id number]   HPI  Found a lump in epigastric area 2 days ago and appears to be getting larger.  She also notes it to be getting inflamed.  Tender to touch.  No fevers or abdominal pain.  PO intake is fine--both fluids and solids. Sugars have been fine in 90s.  .    Current Meds  Medication Sig   acetaminophen (TYLENOL) 500 MG tablet Take 500 mg by mouth as needed.   amLODipine (NORVASC) 10 MG tablet Take 1 tablet by mouth once daily   atorvastatin (LIPITOR) 40 MG tablet TAKE ONE TABLET BY MOUTH ONCE DAILY STOP ZOCOR   blood glucose meter kit and supplies Dispense based on patient and insurance preference. Use up to four times daily as directed. (FOR ICD-10 E10.9, E11.9).   Blood Glucose Monitoring Suppl (ONETOUCH VERIO) w/Device KIT Check blood sugars twice daily before meals   Calcium Citrate 250 MG TABS 2 tabs by mouth twice daily   Cholecalciferol (VITAMIN D3) 25 MCG (1000 UT) CAPS Take 1 capsule (1,000 Units total) by mouth daily.   clopidogrel (PLAVIX) 75 MG tablet Take 1 tablet by mouth once daily   Continuous Blood Gluc Sensor (FREESTYLE LIBRE 3 SENSOR) MISC Place 1 sensor on the skin every 14 days. Use to check glucose continuously   cyclobenzaprine (FLEXERIL) 10 MG tablet 1/2 to 1 tab by mouth twice daily as needed for muscle pain and spasm   empagliflozin (JARDIANCE) 10 MG TABS tablet Take 1 tablet (10 mg total) by mouth daily before breakfast.   ezetimibe (ZETIA) 10 MG tablet Take 1 tablet by mouth once daily   glucose blood (ONETOUCH VERIO) test strip Check blood glucose twice daily before meals   Insulin Pen Needle (RELION PEN NEEDLES) 31G X 6 MM MISC USE 1  ONCE DAILY   Lancets (ONETOUCH ULTRASOFT) lancets Check blood sugar twice daily before meals   LANTUS SOLOSTAR 100 UNIT/ML Solostar Pen 20 units injected subcutaneously before breakfast in morning.   losartan-hydrochlorothiazide  (HYZAAR) 100-25 MG tablet Take 1 tablet by mouth once daily   metoprolol succinate (TOPROL-XL) 100 MG 24 hr tablet TAKE 1 TABLET BY MOUTH ONCE DAILY WITH OR FOLLOWING A MEAL   nitroGLYCERIN (NITROSTAT) 0.4 MG SL tablet 1 tab under tongue as needed for Chest discomfort and repeat every 5 minutes x 2 if discomfort not relieved   spironolactone (ALDACTONE) 25 MG tablet Take 1 tablet by mouth once daily   tirzepatide (MOUNJARO) 10 MG/0.5ML Pen Inject 10 mg into the skin once a week.   Allergies  Allergen Reactions   Aspirin Anaphylaxis   Ibuprofen Anaphylaxis   Ace Inhibitors Cough     Review of Systems    Objective:   BP 120/76 (BP Location: Right Arm, Patient Position: Sitting, Cuff Size: Large)   Pulse 69   Resp 18   Ht 5\' 10"  (1.778 m)   Wt (!) 324 lb 11.8 oz (147.3 kg)   SpO2 97%   BMI 46.59 kg/m   Physical Exam NAD Morbidly obese Lungs:  CTA Chest and epigastric area:  large pendulous breasts overly her entire epigastric area.  Mid epigastric skin with 4 cm circular area of induration and erythema.  No fluctuance.  Tender to touch.  No obvious break in skin. CV:  RRR without murmur or rub.    Assessment & Plan   Cellulitis of mid  epigastric skin, perhaps with developing abscess, but currently just feel induration.   Stop rubbing at area.  Apply warm pack 30 minutes after taking Augmentin for 20 minutes. Call if any fever, extension of erythema or induration or new symptoms--particularly if GI though this appears to be only involving skin and not attached to anything deeper.

## 2023-04-14 ENCOUNTER — Telehealth: Payer: Self-pay

## 2023-04-14 LAB — CBC WITH DIFFERENTIAL/PLATELET
Basophils Absolute: 0.1 10*3/uL (ref 0.0–0.2)
Basos: 1 %
EOS (ABSOLUTE): 0.3 10*3/uL (ref 0.0–0.4)
Eos: 3 %
Hematocrit: 39.9 % (ref 34.0–46.6)
Hemoglobin: 13.1 g/dL (ref 11.1–15.9)
Immature Grans (Abs): 0 10*3/uL (ref 0.0–0.1)
Immature Granulocytes: 0 %
Lymphocytes Absolute: 2.3 10*3/uL (ref 0.7–3.1)
Lymphs: 21 %
MCH: 26 pg — ABNORMAL LOW (ref 26.6–33.0)
MCHC: 32.8 g/dL (ref 31.5–35.7)
MCV: 79 fL (ref 79–97)
Monocytes Absolute: 0.6 10*3/uL (ref 0.1–0.9)
Monocytes: 6 %
Neutrophils Absolute: 7.5 10*3/uL — ABNORMAL HIGH (ref 1.4–7.0)
Neutrophils: 69 %
Platelets: 327 10*3/uL (ref 150–450)
RBC: 5.04 x10E6/uL (ref 3.77–5.28)
RDW: 13.5 % (ref 11.7–15.4)
WBC: 10.7 10*3/uL (ref 3.4–10.8)

## 2023-04-14 LAB — COMPREHENSIVE METABOLIC PANEL
ALT: 6 IU/L (ref 0–32)
AST: 7 IU/L (ref 0–40)
Albumin: 4.2 g/dL (ref 3.8–4.9)
Alkaline Phosphatase: 103 IU/L (ref 44–121)
BUN/Creatinine Ratio: 14 (ref 9–23)
BUN: 18 mg/dL (ref 6–24)
Bilirubin Total: 0.2 mg/dL (ref 0.0–1.2)
CO2: 24 mmol/L (ref 20–29)
Calcium: 9.6 mg/dL (ref 8.7–10.2)
Chloride: 101 mmol/L (ref 96–106)
Creatinine, Ser: 1.31 mg/dL — ABNORMAL HIGH (ref 0.57–1.00)
Globulin, Total: 2.7 g/dL (ref 1.5–4.5)
Glucose: 107 mg/dL — ABNORMAL HIGH (ref 70–99)
Potassium: 4.3 mmol/L (ref 3.5–5.2)
Sodium: 139 mmol/L (ref 134–144)
Total Protein: 6.9 g/dL (ref 6.0–8.5)
eGFR: 48 mL/min/{1.73_m2} — ABNORMAL LOW (ref >59)

## 2023-04-14 NOTE — Telephone Encounter (Signed)
 Patient states the lump area has not changed since yesterday.   Started antibiotics yesterday with two doses now first dose was 3/18,  second dose was 3/19 6:30am will take second dose for 3/19 at 6:30pm patient is using heating pad.  Reports area is tender and a little painful.

## 2023-04-15 ENCOUNTER — Encounter: Payer: Self-pay | Admitting: Internal Medicine

## 2023-04-15 ENCOUNTER — Ambulatory Visit (INDEPENDENT_AMBULATORY_CARE_PROVIDER_SITE_OTHER): Admitting: Internal Medicine

## 2023-04-15 VITALS — BP 110/70 | HR 81 | Temp 98.5°F | Resp 18 | Ht 70.0 in | Wt 324.7 lb

## 2023-04-15 DIAGNOSIS — L03311 Cellulitis of abdominal wall: Secondary | ICD-10-CM | POA: Diagnosis not present

## 2023-04-15 NOTE — Telephone Encounter (Signed)
 Pcp is aware of patient's status.   Patient does have appt on 04/15/2023 with pcp

## 2023-04-15 NOTE — Progress Notes (Signed)
 Subjective:    Patient ID: Rebecca Avila, female   DOB: Aug 26, 1965, 58 y.o.   MRN: [id number]   HPI  Epigastric cellulitis:  still with pain.  Taking Tylenol, which helps.  No fever.  Has had 4 doses of Augmentin.  Applying heating pad to area, particularly 30 min after taking abx. No abdominal pain.  Eating and drinking fine. No elevation of WBC and CMP unremarkable other than chronically elevated creatinine.  Current Meds  Medication Sig   acetaminophen (TYLENOL) 500 MG tablet Take 500 mg by mouth as needed.   amLODipine (NORVASC) 10 MG tablet Take 1 tablet by mouth once daily   amoxicillin-clavulanate (AUGMENTIN) 875-125 MG tablet Take 1 tablet by mouth 2 (two) times daily.   atorvastatin (LIPITOR) 40 MG tablet TAKE ONE TABLET BY MOUTH ONCE DAILY STOP ZOCOR   blood glucose meter kit and supplies Dispense based on patient and insurance preference. Use up to four times daily as directed. (FOR ICD-10 E10.9, E11.9).   Blood Glucose Monitoring Suppl (ONETOUCH VERIO) w/Device KIT Check blood sugars twice daily before meals   Calcium Citrate 250 MG TABS 2 tabs by mouth twice daily   Cholecalciferol (VITAMIN D3) 25 MCG (1000 UT) CAPS Take 1 capsule (1,000 Units total) by mouth daily.   clopidogrel (PLAVIX) 75 MG tablet Take 1 tablet by mouth once daily   Continuous Blood Gluc Sensor (FREESTYLE LIBRE 3 SENSOR) MISC Place 1 sensor on the skin every 14 days. Use to check glucose continuously   cyclobenzaprine (FLEXERIL) 10 MG tablet 1/2 to 1 tab by mouth twice daily as needed for muscle pain and spasm   empagliflozin (JARDIANCE) 10 MG TABS tablet Take 1 tablet (10 mg total) by mouth daily before breakfast.   ezetimibe (ZETIA) 10 MG tablet Take 1 tablet by mouth once daily   glucose blood (ONETOUCH VERIO) test strip Check blood glucose twice daily before meals   Insulin Pen Needle (RELION PEN NEEDLES) 31G X 6 MM MISC USE 1  ONCE DAILY   Lancets (ONETOUCH ULTRASOFT) lancets Check blood sugar  twice daily before meals   LANTUS SOLOSTAR 100 UNIT/ML Solostar Pen 20 units injected subcutaneously before breakfast in morning.   losartan-hydrochlorothiazide (HYZAAR) 100-25 MG tablet Take 1 tablet by mouth once daily   metoprolol succinate (TOPROL-XL) 100 MG 24 hr tablet TAKE 1 TABLET BY MOUTH ONCE DAILY WITH OR FOLLOWING A MEAL   nitroGLYCERIN (NITROSTAT) 0.4 MG SL tablet 1 tab under tongue as needed for Chest discomfort and repeat every 5 minutes x 2 if discomfort not relieved   spironolactone (ALDACTONE) 25 MG tablet Take 1 tablet by mouth once daily   tirzepatide (MOUNJARO) 10 MG/0.5ML Pen Inject 10 mg into the skin once a week.   Allergies  Allergen Reactions   Aspirin Anaphylaxis   Ibuprofen Anaphylaxis   Hydralazine Other (See Comments)    polyarthralgias   Ace Inhibitors Cough     Review of Systems    Objective:   BP 110/70 (BP Location: Left Arm, Patient Position: Sitting, Cuff Size: Large)   Pulse 81   Temp 98.5 F (36.9 C) (Oral)   Resp 18   Ht 5\' 10"  (1.778 m)   Wt (!) 324 lb 11.8 oz (147.3 kg)   SpO2 96%   BMI 46.59 kg/m   Physical Exam Abdominal:     General: Bowel sounds are normal.       Comments: Epigastric area with flattening of indurated, erythematous area and about 1/2  cm decrease circumference from 2 days ago  Overlying erythema now slightly violaceous in center.   Mild increased warmth over area No fluctuance, just induration. Tender to touch.      Assessment & Plan  Localized cellulitis:  continue with Augmentin and follow 30 minutes after taking med with warm packing for about 20-30 minutes.  No sleeping with heating pad. Call over weekend if worsening or new symptoms. Call progress report on Monday.

## 2023-04-15 NOTE — Patient Instructions (Signed)
 Call Report on Monday or over weekend if new or worsening symptoms  Continue antibiotics and heat pad as directed

## 2023-04-19 ENCOUNTER — Telehealth: Payer: Self-pay

## 2023-04-19 NOTE — Telephone Encounter (Signed)
 Patient called reporting pain with pus slowly coming out which started yesterday patient reports she is still taking antibiotics.   Reports she has not smelled any odor from area. Reports she is still using her heating pad.   Per mulberry have patient come in on Wednesday to follow up

## 2023-04-21 ENCOUNTER — Ambulatory Visit: Admitting: Internal Medicine

## 2023-04-21 ENCOUNTER — Encounter: Payer: Self-pay | Admitting: Internal Medicine

## 2023-04-21 VITALS — BP 120/68 | HR 68 | Resp 16 | Ht 70.0 in | Wt 323.5 lb

## 2023-04-21 DIAGNOSIS — L02211 Cutaneous abscess of abdominal wall: Secondary | ICD-10-CM | POA: Diagnosis not present

## 2023-04-21 NOTE — Progress Notes (Signed)
 I & D  Date/Time: 04/21/2023 7:00 PM  Performed by: Julieanne Manson, MD Authorized by: Julieanne Manson, MD   Consent:    Consent obtained:  Written   Risks, benefits, and alternatives were discussed: yes     Risks discussed:  Bleeding and infection Location:    Type:  Abscess (Anterior abdominal wall/epigastric area)   Size:  4 cm   Location:  Trunk   Trunk location:  Abdomen Pre-procedure details:    Skin preparation:  Povidone-iodine Sedation:    Sedation type:  None Anesthesia:    Anesthesia method:  Local infiltration   Local anesthetic:  Lidocaine 1% w/o epi Procedure type:    Complexity:  Simple Procedure details:    Needle aspiration: no     Incision types:  Single straight   Scalpel blade:  11   Wound management:  Probed and deloculated   Drainage:  Purulent and bloody   Drainage amount:  Moderate   Wound treatment:  Wound left open   Packing materials:  Wick placed Post-procedure details:    Procedure completion:  Tolerated well, no immediate complications (A bit diaphoretic and hot as room was hot.  Given water and reclined and felt better and driven home by husband.) Comments:     Patient with 4 more doses of Augmentin.  Indurated circular wound began having purulent drainage from two pinhole openings today.    ABD pad placed over wound.  To call for increased pain, redness, bleeding, fever.

## 2023-04-23 ENCOUNTER — Ambulatory Visit: Admitting: Internal Medicine

## 2023-04-23 DIAGNOSIS — L02211 Cutaneous abscess of abdominal wall: Secondary | ICD-10-CM

## 2023-04-23 MED ORDER — AMOXICILLIN-POT CLAVULANATE 875-125 MG PO TABS: ORAL_TABLET | ORAL | 0 refills | Status: DC

## 2023-04-23 NOTE — Progress Notes (Signed)
 Ms. Leveille here for quick recheck after I & D of epigastric abdominal wall abscess 2 days ago.  She is feeling better, with decreased pain to the area.   She is not having discharge now.  The anterior wall of lesion is very soft now and at least 1 cm in diameter less than when seen 2 d ago.  Unable to express discharge from opening.  The base of lesion remains indurated and tender with deep palpation.  Abdominal wall abscess/cellulitis:  Still with induration.  Probing was performed of abscess to break up septations las visit.  Not clear if base of abscess wall will develop more drainage.  To continue with warm packs. Adding another 5 days of Augmentin.   No result yet from culture of pustular discharge.   Will see her back at end of antibiotic treatment next Thursday.

## 2023-04-26 LAB — WOUND CULTURE: Organism ID, Bacteria: NONE SEEN

## 2023-04-29 ENCOUNTER — Ambulatory Visit (INDEPENDENT_AMBULATORY_CARE_PROVIDER_SITE_OTHER): Admitting: Internal Medicine

## 2023-04-29 ENCOUNTER — Encounter: Payer: Self-pay | Admitting: Internal Medicine

## 2023-04-29 VITALS — Temp 97.6°F

## 2023-04-29 DIAGNOSIS — L02211 Cutaneous abscess of abdominal wall: Secondary | ICD-10-CM

## 2023-04-29 MED ORDER — FLUCONAZOLE 150 MG PO TABS: ORAL_TABLET | ORAL | 0 refills | Status: DC

## 2023-04-29 NOTE — Progress Notes (Signed)
    Subjective:    Patient ID: Rebecca Avila, female   DOB: 1965-07-10, 58 y.o.   MRN: [id number]   HPI  Abdominal wall abscess, epigastric area:  pain resolved.  Still with hardness of skin in area.  No fever.  Last day of augmentin and now with vaginal itching. Wound drainage culture:  GS negative and culture without growth (Opened and drained after on days of Augmentin)  No outpatient medications have been marked as taking for the 04/29/23 encounter (Office Visit) with Julieanne Manson, MD.   Allergies  Allergen Reactions   Aspirin Anaphylaxis   Ibuprofen Anaphylaxis   Hydralazine Other (See Comments)    polyarthralgias   Ace Inhibitors Cough     Review of Systems    Objective:   Temp 97.6 F (36.4 C)   Physical Exam  I & D wound now healed.  Erythema and warmth resolved.  Subcutaneous hardening still noted, but diameter about 2 cm smaller.  No fluctuance.     Assessment & Plan   Anterior abdominal wall abscess:  still with some changes to deeper levels of skin, but no obvious residual abscess.  If hardening/induration does not resolve over next few weeks, may need CT to evaluate.  To call if symptoms of infection recur.  Stop pushing on the area.  2.  Yeast vaginitis:  Fluconazole 150 mg daily for 3 days.

## 2023-04-29 NOTE — Patient Instructions (Signed)
 Call for increased pain, redness, swelling, drainage or fever

## 2023-05-04 ENCOUNTER — Other Ambulatory Visit: Payer: Self-pay | Admitting: Internal Medicine

## 2023-05-17 ENCOUNTER — Other Ambulatory Visit (HOSPITAL_BASED_OUTPATIENT_CLINIC_OR_DEPARTMENT_OTHER)

## 2023-05-25 ENCOUNTER — Other Ambulatory Visit: Payer: 59

## 2023-05-25 DIAGNOSIS — R7989 Other specified abnormal findings of blood chemistry: Secondary | ICD-10-CM

## 2023-05-26 LAB — VITAMIN D 25 HYDROXY (VIT D DEFICIENCY, FRACTURES): Vit D, 25-Hydroxy: 10.7 ng/mL — ABNORMAL LOW (ref 30.0–100.0)

## 2023-06-07 ENCOUNTER — Other Ambulatory Visit: Payer: Self-pay | Admitting: Internal Medicine

## 2023-06-15 ENCOUNTER — Ambulatory Visit: Admitting: Dietician

## 2023-06-25 ENCOUNTER — Ambulatory Visit: Admitting: Dietician

## 2023-06-28 ENCOUNTER — Encounter: Payer: 59 | Admitting: Internal Medicine

## 2023-07-27 ENCOUNTER — Ambulatory Visit: Admitting: Dietician

## 2023-08-10 ENCOUNTER — Other Ambulatory Visit: Payer: Self-pay | Admitting: Internal Medicine

## 2023-08-12 ENCOUNTER — Encounter: Payer: Self-pay | Admitting: Internal Medicine

## 2023-08-12 ENCOUNTER — Ambulatory Visit: Payer: 59 | Admitting: Internal Medicine

## 2023-08-12 VITALS — BP 120/80 | HR 66 | Resp 16 | Ht 70.0 in | Wt 320.0 lb

## 2023-08-12 DIAGNOSIS — I10 Essential (primary) hypertension: Secondary | ICD-10-CM

## 2023-08-12 DIAGNOSIS — Z6841 Body Mass Index (BMI) 40.0 and over, adult: Secondary | ICD-10-CM

## 2023-08-12 DIAGNOSIS — E782 Mixed hyperlipidemia: Secondary | ICD-10-CM | POA: Diagnosis not present

## 2023-08-12 DIAGNOSIS — Z1211 Encounter for screening for malignant neoplasm of colon: Secondary | ICD-10-CM

## 2023-08-12 DIAGNOSIS — E1165 Type 2 diabetes mellitus with hyperglycemia: Secondary | ICD-10-CM | POA: Diagnosis not present

## 2023-08-12 DIAGNOSIS — E66813 Obesity, class 3: Secondary | ICD-10-CM | POA: Diagnosis not present

## 2023-08-12 DIAGNOSIS — Z794 Long term (current) use of insulin: Secondary | ICD-10-CM

## 2023-08-12 MED ORDER — TIRZEPATIDE 12.5 MG/0.5ML ~~LOC~~ SOAJ: 12.5000 mg | SUBCUTANEOUS | 11 refills | Status: DC

## 2023-08-12 NOTE — Progress Notes (Signed)
 Subjective:    Patient ID: Rebecca Avila, female   DOB: March 29, 1965, 58 y.o.   MRN: [id number]   HPI   DM and obesity:  She did go to dietician once, but second appt needed rescheduling as she went on a trip.  She has not been physically active.  Has gained back 14 lbs from beginning of year when more active.  Has not missed Mounjaro , currently 01 mg weekly.    2.  Hypertension:  tolerating meds fine.    3.  Hyperlipidemia:  At goal in December with Atorvastatin  and Zetia .    4.  HM:  She did not call GI back to schedule colonoscopy.  Refusing all vaccines recommended.    Current Meds  Medication Sig   acetaminophen  (TYLENOL ) 500 MG tablet Take 500 mg by mouth as needed.   amLODipine  (NORVASC ) 10 MG tablet Take 1 tablet by mouth once daily   atorvastatin  (LIPITOR) 40 MG tablet TAKE ONE TABLET BY MOUTH ONCE DAILY STOP ZOCOR    blood glucose meter kit and supplies Dispense based on patient and insurance preference. Use up to four times daily as directed. (FOR ICD-10 E10.9, E11.9).   Blood Glucose Monitoring Suppl (ONETOUCH VERIO) w/Device KIT Check blood sugars twice daily before meals   Calcium  Citrate 250 MG TABS 2 tabs by mouth twice daily   Cholecalciferol  (VITAMIN D3) 25 MCG (1000 UT) CAPS Take 1 capsule (1,000 Units total) by mouth daily.   clopidogrel  (PLAVIX ) 75 MG tablet Take 1 tablet by mouth once daily   cyclobenzaprine  (FLEXERIL ) 10 MG tablet 1/2 to 1 tab by mouth twice daily as needed for muscle pain and spasm   empagliflozin  (JARDIANCE ) 10 MG TABS tablet Take 1 tablet (10 mg total) by mouth daily before breakfast.   ezetimibe  (ZETIA ) 10 MG tablet Take 1 tablet by mouth once daily   glucose blood (ONETOUCH VERIO) test strip Check blood glucose twice daily before meals   Insulin  Pen Needle (RELION PEN NEEDLES) 31G X 6 MM MISC USE 1  ONCE DAILY   Lancets (ONETOUCH ULTRASOFT) lancets Check blood sugar twice daily before meals   LANTUS  SOLOSTAR 100 UNIT/ML Solostar Pen 20  units injected subcutaneously before breakfast in morning.   losartan -hydrochlorothiazide  (HYZAAR) 100-25 MG tablet Take 1 tablet by mouth once daily   metoprolol  succinate (TOPROL -XL) 100 MG 24 hr tablet TAKE 1 TABLET BY MOUTH ONCE DAILY WITH  OR  FOLLOWING  A  MEAL   nitroGLYCERIN  (NITROSTAT ) 0.4 MG SL tablet 1 tab under tongue as needed for Chest discomfort and repeat every 5 minutes x 2 if discomfort not relieved   spironolactone  (ALDACTONE ) 25 MG tablet Take 1 tablet by mouth once daily   tirzepatide  (MOUNJARO ) 10 MG/0.5ML Pen Inject 10 mg into the skin once a week.   Allergies  Allergen Reactions   Aspirin Anaphylaxis   Ibuprofen Anaphylaxis   Hydralazine  Other (See Comments)    polyarthralgias   Ace Inhibitors Cough     Review of Systems    Objective:   BP 120/80 (BP Location: Left Arm, Patient Position: Sitting, Cuff Size: Large)   Pulse 66   Resp 16   Ht 5' 10 (1.778 m)   Wt (!) 324 lb (147 kg)   BMI 46.49 kg/m   Physical Exam Constitutional:      Appearance: She is obese.  HENT:     Head: Normocephalic and atraumatic.  Eyes:     Extraocular Movements: Extraocular movements intact.  Conjunctiva/sclera: Conjunctivae normal.     Pupils: Pupils are equal, round, and reactive to light.  Cardiovascular:     Rate and Rhythm: Normal rate and regular rhythm.     Heart sounds: S1 normal and S2 normal. No murmur heard.    No friction rub. No S3 or S4 sounds.     Comments: No carotid bruits.  Carotid, radial, femoral, DP and PT pulses normal and equal.   Pulmonary:     Effort: Pulmonary effort is normal.     Breath sounds: Normal breath sounds.  Abdominal:     General: Bowel sounds are normal.     Palpations: Abdomen is soft.  Musculoskeletal:     Right lower leg: No edema.     Left lower leg: No edema.  Neurological:     Mental Status: She is alert.      Assessment & Plan   DM and obesity:  increase Mounjaro  to 12.5 mg weekly.  Call if sugars below  100.  Follow up in 1 month for weight check and sugar log.  2.  Hypertension:  controlled.    3.  Hyperlipidemia:  At goal within year  4.  HM:  declines recommended vaccines.  Referral again for colonoscopy.  States she will make sure she responds

## 2023-08-13 LAB — HGB A1C W/O EAG: Hgb A1c MFr Bld: 6.4 % — ABNORMAL HIGH (ref 4.8–5.6)

## 2023-09-02 ENCOUNTER — Other Ambulatory Visit: Payer: Self-pay | Admitting: Internal Medicine

## 2023-09-09 ENCOUNTER — Other Ambulatory Visit

## 2023-09-14 ENCOUNTER — Other Ambulatory Visit

## 2023-09-14 NOTE — Progress Notes (Deleted)
 Pt. States that she is taking all Dm medication as prescribed.Dose amount is 12.5mg  of Mounjaro . Jardiance  10mg . Day started unknown of exact date.2 years ago.Injection date is Monday.No issues with the medication at this time.Am Sugars 89 -90. Pm sugars are not being taking. Pt. Requested that her insulin  medication be like to reviewed.Previous weight 324lbs Current weight 320lbs.

## 2023-09-14 NOTE — Progress Notes (Signed)
  Pt. States that she is taking all Dm medication as prescribed.Dose amount is 12.5mg  of Mounjaro . Jardiance  10mg . Day started unknown of exact date.2 years ago.Injection date is Monday.No issues with the medication at this time.Am Sugars 89 -90. Pm sugars are not being taking. Pt. Requested that her insulin  medication be like to reviewed.Previous weight 324lbs Current weight 320lbs.

## 2023-09-23 ENCOUNTER — Other Ambulatory Visit: Payer: Self-pay | Admitting: Internal Medicine

## 2023-09-29 ENCOUNTER — Encounter: Payer: Self-pay | Admitting: Gastroenterology

## 2023-09-30 ENCOUNTER — Other Ambulatory Visit: Payer: 59

## 2023-10-12 ENCOUNTER — Telehealth: Payer: Self-pay

## 2023-10-12 NOTE — Telephone Encounter (Signed)
 Called made to confirm if patient is still on blood thinner (plavix ). VM left for patient to call verify. Please schedule the pt for an OV if she is as she has not been seen in our office.

## 2023-10-13 ENCOUNTER — Encounter: Payer: Self-pay | Admitting: Gastroenterology

## 2023-10-13 NOTE — Telephone Encounter (Signed)
Thank you for getting her scheduled.

## 2023-10-13 NOTE — Telephone Encounter (Signed)
 Inbound call from patient stating she is still on blood thinners so went ahead and scheduled pt for office visit first to discuss colonoscopy.  Please advise  Thank you

## 2023-10-18 ENCOUNTER — Ambulatory Visit: Payer: Self-pay | Admitting: Internal Medicine

## 2023-10-22 ENCOUNTER — Other Ambulatory Visit: Payer: Self-pay | Admitting: Internal Medicine

## 2023-10-26 ENCOUNTER — Encounter

## 2023-10-28 ENCOUNTER — Ambulatory Visit
Admission: RE | Admit: 2023-10-28 | Discharge: 2023-10-28 | Disposition: A | Payer: 59 | Source: Ambulatory Visit | Attending: Internal Medicine | Admitting: Internal Medicine

## 2023-10-28 DIAGNOSIS — Z1231 Encounter for screening mammogram for malignant neoplasm of breast: Secondary | ICD-10-CM

## 2023-11-01 ENCOUNTER — Other Ambulatory Visit

## 2023-11-08 ENCOUNTER — Other Ambulatory Visit

## 2023-11-09 ENCOUNTER — Encounter: Admitting: Gastroenterology

## 2023-11-21 ENCOUNTER — Other Ambulatory Visit: Payer: Self-pay | Admitting: Internal Medicine

## 2023-12-02 NOTE — Progress Notes (Unsigned)
 Chief Complaint: Discuss colonoscopy  HPI:    Rebecca Avila is a 58 year old female with a past medical history as listed below including CKD, chronic anticoagulation on Plavix , diabetes, obesity and multiple others who was referred to me by Adella Norris, MD for discussion of a colonoscopy.    08/12/2021 echo with LVEF 55-60% marked bradycardia, 43-45 bpm.    03/25/2023 patient seen by cardiology.  At that time on Mounjaro .   Past Medical History:  Diagnosis Date   Anemia    Anxiety    Chest pain 2023   Was seen and now followed by Dr Wendel 07/2021.  echo normal, never went for cardiac CT.  Chest pain resolved following medication changes for BP, Plavix  as ASA allergy and DM med changes.   CKD (chronic kidney disease)    Depression    Diabetes mellitus without complication (HCC)    type 2    Environmental allergies    Hyperlipidemia    Hypertension    Multinodular goiter    01/2023:  required follow up US  in 01/2024 to follow for stability of 2 nodules   Obesity    Osteopenia 2021   associated with primary hyperparathyroidism   Primary hyperparathyroidism 2021   Hypercalcemia from 2014 with significant increase in 2020 and elevated PTH.  Left parathyroid  adenoma excised in 2022   Sciatica     Past Surgical History:  Procedure Laterality Date   PARATHYROIDECTOMY Left 03/29/2020   Procedure: LEFT INFERIOR PARATHYROIDECTOMY;  Surgeon: Eletha Boas, MD;  Location: [facility];  Service: General;  Laterality: Left;  ROOM 1 STARTING AT 12:00PM FOR 90 MIN   TOTAL VAGINAL HYSTERECTOMY  2007   For benign reasons--fibroids    Current Outpatient Medications  Medication Sig Dispense Refill   acetaminophen  (TYLENOL ) 500 MG tablet Take 500 mg by mouth as needed.     amLODipine  (NORVASC ) 10 MG tablet Take 1 tablet by mouth once daily 90 tablet 3   atorvastatin  (LIPITOR) 40 MG tablet TAKE ONE TABLET BY MOUTH ONCE DAILY STOP ZOCOR  90 tablet 3   blood glucose meter kit and supplies  Dispense based on patient and insurance preference. Use up to four times daily as directed. (FOR ICD-10 E10.9, E11.9). 1 each 0   Blood Glucose Monitoring Suppl (ONETOUCH VERIO) w/Device KIT Check blood sugars twice daily before meals 1 kit 0   Calcium  Citrate 250 MG TABS 2 tabs by mouth twice daily 120 tablet 11   Cholecalciferol  (VITAMIN D3) 25 MCG (1000 UT) CAPS Take 1 capsule (1,000 Units total) by mouth daily. 30 capsule 11   clopidogrel  (PLAVIX ) 75 MG tablet Take 1 tablet by mouth once daily 90 tablet 1   cyclobenzaprine  (FLEXERIL ) 10 MG tablet 1/2 to 1 tab by mouth twice daily as needed for muscle pain and spasm 60 tablet 0   empagliflozin  (JARDIANCE ) 10 MG TABS tablet Take 1 tablet (10 mg total) by mouth daily before breakfast. 30 tablet 3   EPINEPHrine  (EPIPEN  2-PAK) 0.3 mg/0.3 mL IJ SOAJ injection Inject 0.3 mLs (0.3 mg total) into the muscle as needed for anaphylaxis. 1 each 1   ezetimibe  (ZETIA ) 10 MG tablet Take 1 tablet by mouth once daily 90 tablet 1   glucose blood (ONETOUCH VERIO) test strip Check blood glucose twice daily before meals 300 each 3   Insulin  Pen Needle (RELION PEN NEEDLES) 31G X 6 MM MISC USE 1  ONCE DAILY 50 each 11   Lancets (ONETOUCH ULTRASOFT) lancets Check blood sugar twice  daily before meals 300 each 3   LANTUS  SOLOSTAR 100 UNIT/ML Solostar Pen 20 units injected subcutaneously before breakfast in morning. 15 mL 6   losartan -hydrochlorothiazide  (HYZAAR) 100-25 MG tablet Take 1 tablet by mouth once daily 90 tablet 3   metoprolol  succinate (TOPROL -XL) 100 MG 24 hr tablet TAKE 1 TABLET BY MOUTH ONCE DAILY WITH  OR  FOLLOWING  A  MEAL 90 tablet 3   nitroGLYCERIN  (NITROSTAT ) 0.4 MG SL tablet 1 tab under tongue as needed for Chest discomfort and repeat every 5 minutes x 2 if discomfort not relieved 25 tablet 2   spironolactone  (ALDACTONE ) 25 MG tablet Take 1 tablet by mouth once daily 90 tablet 3   tirzepatide  (MOUNJARO ) 12.5 MG/0.5ML Pen Inject 12.5 mg into the skin  once a week. 6 mL 11   No current facility-administered medications for this visit.    Allergies as of 12/03/2023 - Review Complete 08/12/2023  Allergen Reaction Noted   Aspirin Anaphylaxis 02/05/2011   Ibuprofen Anaphylaxis 02/05/2011   Hydralazine  Other (See Comments) 04/13/2023   Ace inhibitors Cough 02/01/2019    Family History  Problem Relation Age of Onset   Lung cancer Mother        Heavy smoker   Lung cancer Father        Not sure if he also died of lung CA--also a smoker   Hypertension Father    High blood pressure Paternal Aunt    High blood pressure Paternal Grandmother    Cancer Brother        congenital tumor of spine--malignant   Mental illness Brother        She does not know his diagnosis   Diabetes Neg Hx    Thyroid  disease Neg Hx    Breast cancer Neg Hx     Social History   Socioeconomic History   Marital status: Married    Spouse name: Lamar   Number of children: 1   Years of education: Not on file   Highest education level: Associate degree: academic program  Occupational History   Not on file  Tobacco Use   Smoking status: Former    Current packs/day: 0.00    Average packs/day: 0.3 packs/day for 28.2 years (7.0 ttl pk-yrs)    Types: Cigarettes    Start date: 01/27/1992    Quit date: 03/29/2020    Years since quitting: 3.6    Passive exposure: Past   Smokeless tobacco: Never   Tobacco comments:    once monthly  Vaping Use   Vaping status: Never Used  Substance and Sexual Activity   Alcohol use: Yes    Comment: occ   Drug use: Never   Sexual activity: Yes    Birth control/protection: Surgical  Other Topics Concern   Not on file  Social History Narrative   Lives with husband   Daughter lives in [place]   Social Drivers of Health   Financial Resource Strain: Not on file  Food Insecurity: No Food Insecurity (04/13/2023)   Hunger Vital Sign    Worried About Running Out of Food in the Last Year: Never true    Ran Out of Food in the Last  Year: Never true  Transportation Needs: No Transportation Needs (02/11/2023)   PRAPARE - Administrator, Civil Service (Medical): No    Lack of Transportation (Non-Medical): No  Physical Activity: Not on file  Stress: Not on file  Social Connections: Unknown (06/10/2021)   [facility]  Social Network    Social Network: Not on file  Intimate Partner Violence: Not At Risk (02/11/2023)   Humiliation, Afraid, Rape, and Kick questionnaire    Fear of Current or Ex-Partner: No    Emotionally Abused: No    Physically Abused: No    Sexually Abused: No    Review of Systems:    Constitutional: No weight loss, fever, chills, weakness or fatigue HEENT: Eyes: No change in vision               Ears, Nose, Throat:  No change in hearing or congestion Skin: No rash or itching Cardiovascular: No chest pain, chest pressure or palpitations   Respiratory: No SOB or cough Gastrointestinal: See HPI and otherwise negative Genitourinary: No dysuria or change in urinary frequency Neurological: No headache, dizziness or syncope Musculoskeletal: No new muscle or joint pain Hematologic: No bleeding or bruising Psychiatric: No history of depression or anxiety    Physical Exam:  Vital signs: There were no vitals taken for this visit.  Constitutional:   Pleasant Caucasian female appears to be in NAD, Well developed, Well nourished, alert and cooperative Head:  Normocephalic and atraumatic. Eyes:   PEERL, EOMI. No icterus. Conjunctiva pink. Ears:  Normal auditory acuity. Neck:  Supple Throat: Oral cavity and pharynx without inflammation, swelling or lesion.  Respiratory: Respirations even and unlabored. Lungs clear to auscultation bilaterally.   No wheezes, crackles, or rhonchi.  Cardiovascular: Normal S1, S2. No MRG. Regular rate and rhythm. No peripheral edema, cyanosis or pallor.  Gastrointestinal:  Soft, nondistended, nontender. No rebound or guarding. Normal bowel sounds. No  appreciable masses or hepatomegaly. Rectal:  Not performed.  Msk:  Symmetrical without gross deformities. Without edema, no deformity or joint abnormality.  Neurologic:  Alert and  oriented x4;  grossly normal neurologically.  Skin:   Dry and intact without significant lesions or rashes. Psychiatric: Oriented to person, place and time. Demonstrates good judgement and reason without abnormal affect or behaviors.  RELEVANT LABS AND IMAGING: CBC    Component Value Date/Time   WBC 10.7 04/13/2023 1258   WBC 11.1 (H) 12/03/2022 0921   RBC 5.04 04/13/2023 1258   RBC 5.05 12/03/2022 0921   HGB 13.1 04/13/2023 1258   HCT 39.9 04/13/2023 1258   PLT 327 04/13/2023 1258   MCV 79 04/13/2023 1258   MCH 26.0 (L) 04/13/2023 1258   MCH 25.5 (L) 12/03/2022 0921   MCHC 32.8 04/13/2023 1258   MCHC 31.7 12/03/2022 0921   RDW 13.5 04/13/2023 1258   LYMPHSABS 2.3 04/13/2023 1258   MONOABS 0.6 12/03/2022 0921   EOSABS 0.3 04/13/2023 1258   BASOSABS 0.1 04/13/2023 1258    CMP     Component Value Date/Time   NA 139 04/13/2023 1258   K 4.3 04/13/2023 1258   CL 101 04/13/2023 1258   CO2 24 04/13/2023 1258   GLUCOSE 107 (H) 04/13/2023 1258   GLUCOSE 89 12/03/2022 0921   BUN 18 04/13/2023 1258   CREATININE 1.31 (H) 04/13/2023 1258   CALCIUM  9.6 04/13/2023 1258   PROT 6.9 04/13/2023 1258   ALBUMIN 4.2 04/13/2023 1258   AST 7 04/13/2023 1258   ALT 6 04/13/2023 1258   ALKPHOS 103 04/13/2023 1258   BILITOT 0.2 04/13/2023 1258   GFRNONAA 44 (L) 12/03/2022 0921   GFRAA 113 12/08/2019 1125    Assessment: 1.  Screening for colorectal cancer:  2.  Chronic anticoagulation: On Plavix  initially started for chest pain, patient advised to have a  CT cardiac score before making a decision about stopping, but never had this done 3.  Obesity: On Mounjaro   Plan: 1. ***     Delon Failing, PA-C [facility] 12/02/2023, 1:25 PM  Cc: Adella Norris, MD

## 2023-12-03 ENCOUNTER — Encounter: Payer: Self-pay | Admitting: Physician Assistant

## 2023-12-03 ENCOUNTER — Telehealth: Payer: Self-pay | Admitting: *Deleted

## 2023-12-03 ENCOUNTER — Ambulatory Visit (INDEPENDENT_AMBULATORY_CARE_PROVIDER_SITE_OTHER): Admitting: Physician Assistant

## 2023-12-03 ENCOUNTER — Ambulatory Visit: Admitting: Gastroenterology

## 2023-12-03 VITALS — BP 118/60 | HR 72 | Ht 69.5 in | Wt 327.2 lb

## 2023-12-03 DIAGNOSIS — E1165 Type 2 diabetes mellitus with hyperglycemia: Secondary | ICD-10-CM | POA: Diagnosis not present

## 2023-12-03 DIAGNOSIS — E6609 Other obesity due to excess calories: Secondary | ICD-10-CM

## 2023-12-03 DIAGNOSIS — Z1211 Encounter for screening for malignant neoplasm of colon: Secondary | ICD-10-CM

## 2023-12-03 DIAGNOSIS — Z7901 Long term (current) use of anticoagulants: Secondary | ICD-10-CM

## 2023-12-03 DIAGNOSIS — Z794 Long term (current) use of insulin: Secondary | ICD-10-CM

## 2023-12-03 DIAGNOSIS — Z1212 Encounter for screening for malignant neoplasm of rectum: Secondary | ICD-10-CM

## 2023-12-03 DIAGNOSIS — R001 Bradycardia, unspecified: Secondary | ICD-10-CM

## 2023-12-03 NOTE — Progress Notes (Signed)
 Attending Physician's Attestation   I have reviewed the chart.   I agree with the Advanced Practitioner's note, impression, and recommendations with any updates as below. Reasonable if we get approval after Cardiology evaluation/followup to proceed with screening colonoscopy.  If this is too significant a risk for some reason, or if there is going to be extended time between evaluation, then consideration in this patient who is average risk for Cologuard could be made as well.  We will await Cardiology evaluation.   Aloha Finner, MD [facility] Advanced Endoscopy Office # [phone]

## 2023-12-03 NOTE — Patient Instructions (Signed)
 Will call back to schedule colonoscopy after we receive clearance from cardiology.   _______________________________________________________  If your blood pressure at your visit was 140/90 or greater, please contact your primary care physician to follow up on this.  _______________________________________________________  If you are age 57 or older, your body mass index should be between 23-30. Your Body mass index is 47.63 kg/m. If this is out of the aforementioned range listed, please consider follow up with your Primary Care Provider.  If you are age 22 or younger, your body mass index should be between 19-25. Your Body mass index is 47.63 kg/m. If this is out of the aformentioned range listed, please consider follow up with your Primary Care Provider.   ________________________________________________________  The  GI providers would like to encourage you to use MYCHART to communicate with providers for non-urgent requests or questions.  Due to long hold times on the telephone, sending your provider a message by [facility] may be a faster and more efficient way to get a response.  Please allow 48 business hours for a response.  Please remember that this is for non-urgent requests.  _______________________________________________________  [facility] is using a team-based approach to care.  Your team is made up of your doctor and two to three APPS. Our APPS (Nurse Practitioners and Physician Assistants) work with your physician to ensure care continuity for you. They are fully qualified to address your health concerns and develop a treatment plan. They communicate directly with your gastroenterologist to care for you. Seeing the Advanced Practice Practitioners on your physician's team can help you by facilitating care more promptly, often allowing for earlier appointments, access to diagnostic testing, procedures, and other specialty referrals.

## 2023-12-03 NOTE — Telephone Encounter (Signed)
 Called patient spouse answered, tried to schedule her for a pre-op clearance appointment. Patient was at the GI doctor and will call back when visit is over.

## 2023-12-03 NOTE — Telephone Encounter (Signed)
   Name: ANNAROSE OUELLET  DOB: February 24, 1965  MRN: [id number]  Primary Cardiologist: Arun K Thukkani, MD  Chart reviewed as part of pre-operative protocol coverage. Because of Dezire K Ashby's past medical history and time since last visit, she will require a follow-up in-office visit in order to better assess preoperative cardiovascular risk.   She was to have cardiac CT and discussion for holding Plavix  thereafter. She did not have cardiac CT completed. Will need to discuss her symptoms and make decision about Plavix  at that time before proceeding with pre-op recommendations. She does not have a follow up appointment on review.   Pre-op covering staff: - Please schedule appointment and call patient to inform them. If patient already had an upcoming appointment within acceptable timeframe, please add pre-op clearance to the appointment notes so provider is aware. - Please contact requesting surgeon's office via preferred method (i.e, phone, fax) to inform them of need for appointment prior to surgery.   Lamarr Satterfield, NP  12/03/2023, 11:37 AM

## 2023-12-03 NOTE — Telephone Encounter (Signed)
 [facility] Pre-operative Risk Assessment     Request for surgical clearance:     Endoscopy Procedure  What type of surgery is being performed?    colonoscopy  When is this surgery scheduled?     TBD  What type of clearance is required ?   Pharmacy & Cardiac  Are there any medications that need to be held prior to surgery and how long? Plavix  5  days  Practice name and name of physician performing surgery?      [facility]  What is your office phone and fax number?      Phone- [phone]  Fax- [phone]  Anesthesia type (None, local, MAC, general) ?       MAC   Please route your response to Powell Misty, CMA

## 2023-12-03 NOTE — Telephone Encounter (Signed)
 Patient returned call

## 2023-12-06 NOTE — Telephone Encounter (Signed)
 Left message for the pt to call back and let the operator know she needs an appt IN OFFICE PREOP CLEARANCE APPT.

## 2023-12-10 ENCOUNTER — Telehealth (HOSPITAL_BASED_OUTPATIENT_CLINIC_OR_DEPARTMENT_OTHER): Payer: Self-pay | Admitting: *Deleted

## 2023-12-10 ENCOUNTER — Telehealth: Payer: Self-pay | Admitting: Internal Medicine

## 2023-12-10 NOTE — Telephone Encounter (Signed)
 Rebecca Avila, Rebecca Avila JW   12/10/23  4:49 PM Note Patient returned Pre-op call.       12/10/23  4:41 PM Howse, Rozalynn K contacted Rebecca Avila, Rebecca Avila

## 2023-12-10 NOTE — Telephone Encounter (Signed)
 Per preop APP Josefa Beauvais, FNP: [name], looks like she needs a phone call, can hold Plavix  for 5 days before and does not need CT.   Pt has been scheduled tele preop appt 12/20/23. Med rec and consent are done. Med rec and consent are done.

## 2023-12-10 NOTE — Telephone Encounter (Signed)
 Per preop APP Josefa Beauvais, FNP: [name], looks like she needs a phone call, can hold Plavix  for 5 days before and does not need CT.   Pt has been scheduled tele preop appt 12/20/23. Med rec and consent are done. Med rec and consent are done.      Patient Consent for Virtual Visit        Rebecca Avila has provided verbal consent on 12/10/2023 for a virtual visit (video or telephone).   CONSENT FOR VIRTUAL VISIT FOR:  Rebecca Avila  By participating in this virtual visit I agree to the following:  I hereby voluntarily request, consent and authorize [facility] and its employed or contracted physicians, physician assistants, nurse practitioners or other licensed health care professionals (the Practitioner), to provide me with telemedicine health care services (the "Services) as deemed necessary by the treating Practitioner. I acknowledge and consent to receive the Services by the Practitioner via telemedicine. I understand that the telemedicine visit will involve communicating with the Practitioner through live audiovisual communication technology and the disclosure of certain medical information by electronic transmission. I acknowledge that I have been given the opportunity to request an in-person assessment or other available alternative prior to the telemedicine visit and am voluntarily participating in the telemedicine visit.  I understand that I have the right to withhold or withdraw my consent to the use of telemedicine in the course of my care at any time, without affecting my right to future care or treatment, and that the Practitioner or I may terminate the telemedicine visit at any time. I understand that I have the right to inspect all information obtained and/or recorded in the course of the telemedicine visit and may receive copies of available information for a reasonable fee.  I understand that some of the potential risks of receiving the Services via telemedicine include:   Delay or interruption in medical evaluation due to technological equipment failure or disruption; Information transmitted may not be sufficient (e.g. poor resolution of images) to allow for appropriate medical decision making by the Practitioner; and/or  In rare instances, security protocols could fail, causing a breach of personal health information.  Furthermore, I acknowledge that it is my responsibility to provide information about my medical history, conditions and care that is complete and accurate to the best of my ability. I acknowledge that Practitioner's advice, recommendations, and/or decision may be based on factors not within their control, such as incomplete or inaccurate data provided by me or distortions of diagnostic images or specimens that may result from electronic transmissions. I understand that the practice of medicine is not an exact science and that Practitioner makes no warranties or guarantees regarding treatment outcomes. I acknowledge that a copy of this consent can be made available to me via my patient portal [facility] MyChart), or I can request a printed copy by calling the office of [facility].    I understand that my insurance will be billed for this visit.   I have read or had this consent read to me. I understand the contents of this consent, which adequately explains the benefits and risks of the Services being provided via telemedicine.  I have been provided ample opportunity to ask questions regarding this consent and the Services and have had my questions answered to my satisfaction. I give my informed consent for the services to be provided through the use of telemedicine in my medical care

## 2023-12-10 NOTE — Telephone Encounter (Signed)
 Patient returned Pre-op call.

## 2023-12-10 NOTE — Telephone Encounter (Signed)
 3rd attempt to reach the pt to schedule an appt in office for preop clearance. I will update the requesting office.

## 2023-12-20 ENCOUNTER — Ambulatory Visit: Attending: Cardiology | Admitting: Cardiology

## 2023-12-20 DIAGNOSIS — Z01810 Encounter for preprocedural cardiovascular examination: Secondary | ICD-10-CM | POA: Diagnosis not present

## 2023-12-20 DIAGNOSIS — Z01818 Encounter for other preprocedural examination: Secondary | ICD-10-CM

## 2023-12-20 NOTE — Progress Notes (Signed)
 Virtual Visit via Telephone Note   Because of Rebecca Avila co-morbid illnesses, she is at least at moderate risk for complications without adequate follow up.  This format is felt to be most appropriate for this patient at this time.  Due to technical limitations with video connection web designer), today's appointment will be conducted as an audio only telehealth visit, and Rebecca Avila verbally agreed to proceed in this manner.   All issues noted in this document were discussed and addressed.  No physical exam could be performed with this format.  Evaluation Performed:  Preoperative cardiovascular risk assessment _____________   Date:  12/20/2023   Patient ID:  Rebecca Avila, DOB 06-24-1965, MRN [id number] Patient Location:  Home Provider location:   Office  Primary Care Provider:  Adella Norris, MD Primary Cardiologist:  Arun K Thukkani, MD  Chief Complaint / Patient Profile   58 y.o. y/o female with a h/o hypertension, hyperlipidemia, obesity, CKD, OSA, DM2, who is pending colonoscopy and presents today for telephonic preoperative cardiovascular risk assessment.  History of Present Illness    Rebecca Avila is a 58 y.o. female who presents via audio/video conferencing for a telehealth visit today.  Pt was last seen in cardiology clinic on 09/02/2023 by Artist Pouch PA. At that time Rebecca Avila was doing well.  The patient is now pending procedure as outlined above. Since her last visit, she has been doing well, she has been trying to increase her physical exercise and makes it a point to get up at work and walk around the parking lot numerous times throughout the day. She denies chest pain, palpitations, dyspnea, pnd, orthopnea, n, v, dizziness, syncope, edema, weight gain, or early satiety.     Past Medical History    Past Medical History:  Diagnosis Date   Anemia    Anxiety    Chest pain 2023   Was seen and now followed by Dr Wendel 07/2021.  echo normal, never went  for cardiac CT.  Chest pain resolved following medication changes for BP, Plavix  as ASA allergy and DM med changes.   CKD (chronic kidney disease)    Depression    Diabetes mellitus without complication (HCC)    type 2    Environmental allergies    Hyperlipidemia    Hypertension    Multinodular goiter    01/2023:  required follow up US  in 01/2024 to follow for stability of 2 nodules   Obesity    Osteopenia 2021   associated with primary hyperparathyroidism   Primary hyperparathyroidism 2021   Hypercalcemia from 2014 with significant increase in 2020 and elevated PTH.  Left parathyroid  adenoma excised in 2022   Sciatica    Past Surgical History:  Procedure Laterality Date   PARATHYROIDECTOMY Left 03/29/2020   Procedure: LEFT INFERIOR PARATHYROIDECTOMY;  Surgeon: Eletha Boas, MD;  Location: [facility];  Service: General;  Laterality: Left;  ROOM 1 STARTING AT 12:00PM FOR 90 MIN   TOTAL VAGINAL HYSTERECTOMY  2007   For benign reasons--fibroids    Allergies  Allergies  Allergen Reactions   Aspirin Anaphylaxis   Ibuprofen Anaphylaxis   Hydralazine  Other (See Comments)    polyarthralgias   Ace Inhibitors Cough    Home Medications    Prior to Admission medications   Medication Sig Start Date End Date Taking? Authorizing Provider  acetaminophen  (TYLENOL ) 500 MG tablet Take 500 mg by mouth as needed.    [provider]  amLODipine  (NORVASC ) 10 MG tablet  Take 1 tablet by mouth once daily 10/01/23   Adella Norris, MD  atorvastatin  (LIPITOR) 40 MG tablet TAKE ONE TABLET BY MOUTH ONCE DAILY STOP ZOCOR  04/08/23   Trudy Birmingham, PA-C  blood glucose meter kit and supplies Dispense based on patient and insurance preference. Use up to four times daily as directed. (FOR ICD-10 E10.9, E11.9). 09/17/21   Jillian Buttery, MD  Blood Glucose Monitoring Suppl (ONETOUCH VERIO) w/Device KIT Check blood sugars twice daily before meals 11/06/19   Adella Norris, MD  Calcium  Citrate 250 MG  TABS 2 tabs by mouth twice daily 02/23/23   Adella Norris, MD  Cholecalciferol  (VITAMIN D3) 25 MCG (1000 UT) CAPS Take 1 capsule (1,000 Units total) by mouth daily. 02/23/23   Adella Norris, MD  clopidogrel  (PLAVIX ) 75 MG tablet Take 1 tablet by mouth once daily 09/03/23   Thukkani, Arun K, MD  cyclobenzaprine  (FLEXERIL ) 10 MG tablet 1/2 to 1 tab by mouth twice daily as needed for muscle pain and spasm 02/19/23   Adella Norris, MD  empagliflozin  (JARDIANCE ) 10 MG TABS tablet Take 1 tablet (10 mg total) by mouth daily before breakfast. 11/23/23   Williams, Evan, PA-C  EPINEPHrine  (EPIPEN  2-PAK) 0.3 mg/0.3 mL IJ SOAJ injection Inject 0.3 mLs (0.3 mg total) into the muscle as needed for anaphylaxis. 02/01/19   Adella Norris, MD  ezetimibe  (ZETIA ) 10 MG tablet Take 1 tablet by mouth once daily 10/22/23   Thukkani, Arun K, MD  glucose blood (ONETOUCH VERIO) test strip Check blood glucose twice daily before meals 04/18/20   Adella Norris, MD  Insulin  Pen Needle (RELION PEN NEEDLES) 31G X 6 MM MISC USE 1  ONCE DAILY 08/13/23   Adella Norris, MD  Lancets [facility] ULTRASOFT) lancets Check blood sugar twice daily before meals 04/18/20   Adella Norris, MD  LANTUS  SOLOSTAR 100 UNIT/ML Solostar Pen 20 units injected subcutaneously before breakfast in morning. 11/22/22   Adella Norris, MD  losartan -hydrochlorothiazide  (HYZAAR) 100-25 MG tablet Take 1 tablet by mouth once daily 05/04/23   Adella Norris, MD  metoprolol  succinate (TOPROL -XL) 100 MG 24 hr tablet TAKE 1 TABLET BY MOUTH ONCE DAILY WITH  OR  FOLLOWING  A  MEAL 06/07/23   Adella Norris, MD  nitroGLYCERIN  (NITROSTAT ) 0.4 MG SL tablet 1 tab under tongue as needed for Chest discomfort and repeat every 5 minutes x 2 if discomfort not relieved 08/06/21   Adella Norris, MD  spironolactone  (ALDACTONE ) 25 MG tablet Take 1 tablet by mouth once daily 12/08/22   Thukkani, Arun K, MD  tirzepatide  (MOUNJARO ) 12.5  MG/0.5ML Pen Inject 12.5 mg into the skin once a week. 08/12/23   Adella Norris, MD    Physical Exam    Vital Signs:  Rebecca Avila does not have vital signs available for review today.  Given telephonic nature of communication, physical exam is limited. AAOx3. NAD. Normal affect.  Speech and respirations are unlabored.  Accessory Clinical Findings    None  Assessment & Plan    1.  Preoperative Cardiovascular Risk Assessment:     Rebecca Avila perioperative risk of a major cardiac event is  % according to the Revised Cardiac Risk Index (RCRI).  Therefore, she is at low risk for perioperative complications.   Her functional capacity is fair at 6.05 METs according to the Duke Activity Status Index (DASI). Recommendations: According to ACC/AHA guidelines, no further cardiovascular testing needed.  The patient may proceed to surgery at acceptable risk.   Antiplatelet and/or Anticoagulation Recommendations:  Clopidogrel  (Plavix ) can be held for 5 days prior to her surgery and resumed as soon as possible post op.   The patient was advised that if she develops new symptoms prior to surgery to contact our office to arrange for a follow-up visit, and she verbalized understanding.    A copy of this note will be routed to requesting surgeon.  Time:   Today, I have spent 10 minutes with the patient with telehealth technology discussing medical history, symptoms, and management plan.     Delon JAYSON Hoover, NP  12/20/2023, 8:19 AM

## 2023-12-21 ENCOUNTER — Telehealth: Payer: Self-pay | Admitting: *Deleted

## 2023-12-21 DIAGNOSIS — Z1211 Encounter for screening for malignant neoplasm of colon: Secondary | ICD-10-CM

## 2023-12-21 DIAGNOSIS — E6609 Other obesity due to excess calories: Secondary | ICD-10-CM

## 2023-12-21 DIAGNOSIS — Z7901 Long term (current) use of anticoagulants: Secondary | ICD-10-CM

## 2023-12-21 MED ORDER — NA SULFATE-K SULFATE-MG SULF 17.5-3.13-1.6 GM/177ML PO SOLN: 1.0000 | Freq: Once | ORAL | 0 refills | Status: AC

## 2023-12-21 NOTE — Telephone Encounter (Signed)
 See telemedicine visit from 12/20/23 patient was cleared for cardiology. Suprep sent to pharmacy. Instructions sent via Mychart.

## 2023-12-21 NOTE — Telephone Encounter (Signed)
 See telemedicine visit patient was cleared by cardiology. Scheduled colonoscopy for 02/18/24 @ 8 am.

## 2024-01-03 ENCOUNTER — Other Ambulatory Visit: Payer: Self-pay | Admitting: Internal Medicine

## 2024-01-10 ENCOUNTER — Emergency Department (HOSPITAL_COMMUNITY)
Admission: EM | Admit: 2024-01-10 | Discharge: 2024-01-10 | Disposition: A | Discharge status: Home / Self Care | Attending: Emergency Medicine | Admitting: Emergency Medicine

## 2024-01-10 ENCOUNTER — Emergency Department (HOSPITAL_COMMUNITY)

## 2024-01-10 ENCOUNTER — Encounter (HOSPITAL_COMMUNITY): Payer: Self-pay

## 2024-01-10 ENCOUNTER — Other Ambulatory Visit: Payer: Self-pay

## 2024-01-10 DIAGNOSIS — S8991XA Unspecified injury of right lower leg, initial encounter: Secondary | ICD-10-CM | POA: Diagnosis present

## 2024-01-10 DIAGNOSIS — M542 Cervicalgia: Secondary | ICD-10-CM | POA: Diagnosis not present

## 2024-01-10 DIAGNOSIS — I129 Hypertensive chronic kidney disease with stage 1 through stage 4 chronic kidney disease, or unspecified chronic kidney disease: Secondary | ICD-10-CM | POA: Diagnosis not present

## 2024-01-10 DIAGNOSIS — S0990XA Unspecified injury of head, initial encounter: Secondary | ICD-10-CM | POA: Diagnosis not present

## 2024-01-10 DIAGNOSIS — Z79899 Other long term (current) drug therapy: Secondary | ICD-10-CM | POA: Diagnosis not present

## 2024-01-10 DIAGNOSIS — E1122 Type 2 diabetes mellitus with diabetic chronic kidney disease: Secondary | ICD-10-CM | POA: Diagnosis not present

## 2024-01-10 DIAGNOSIS — N189 Chronic kidney disease, unspecified: Secondary | ICD-10-CM | POA: Diagnosis not present

## 2024-01-10 DIAGNOSIS — Z794 Long term (current) use of insulin: Secondary | ICD-10-CM | POA: Diagnosis not present

## 2024-01-10 DIAGNOSIS — Z7902 Long term (current) use of antithrombotics/antiplatelets: Secondary | ICD-10-CM | POA: Diagnosis not present

## 2024-01-10 DIAGNOSIS — W010XXA Fall on same level from slipping, tripping and stumbling without subsequent striking against object, initial encounter: Secondary | ICD-10-CM | POA: Diagnosis not present

## 2024-01-10 MED ORDER — OXYCODONE-ACETAMINOPHEN 5-325 MG PO TABS
1.0000 | ORAL_TABLET | Freq: Once | ORAL | Status: AC
  Administered 2024-01-10: 22:00:00 1 via ORAL
  Filled 2024-01-10: qty 1

## 2024-01-10 MED ORDER — OXYCODONE-ACETAMINOPHEN 5-325 MG PO TABS
1.0000 | ORAL_TABLET | Freq: Once | ORAL | Status: AC
  Administered 2024-01-10: 20:00:00 1 via ORAL
  Filled 2024-01-10: qty 1

## 2024-01-10 MED ORDER — HYDROCODONE-ACETAMINOPHEN 5-325 MG PO TABS: 1.0000 | ORAL_TABLET | Freq: Four times a day (QID) | ORAL | 0 refills | Status: DC | PRN

## 2024-01-10 NOTE — Discharge Instructions (Addendum)
 The x-ray did show an avulsion fracture in your knee.  Apply ice to help with the swelling.  Take medications as needed for pain.  Follow-up with an orthopedic doctor for further evaluation as we discussed

## 2024-01-10 NOTE — ED Provider Notes (Signed)
 [place] EMERGENCY DEPARTMENT AT [facility] Provider Note   CSN: [id number] Arrival date & time: 01/10/24  1847     Patient presents with: Rebecca Avila   Rebecca Avila is a 58 y.o. female.    Fall     Patient has a history of hypertension and sciatica diabetes anxiety chronic kidney disease goiter.  Patient states she presented to the ED for evaluation of a fall.  Patient tripped and fell forward.  She ended up striking her head.  Patient is also having significant pain primarily in her right knee.  She is having headache and some pain in the back of her neck.  She is some soreness on the right side but primarily is having pain in her right knee.  Prior to Admission medications  Medication Sig Start Date End Date Taking? Authorizing Provider  HYDROcodone -acetaminophen  (NORCO/VICODIN) 5-325 MG tablet Take 1 tablet by mouth every 6 (six) hours as needed. 01/10/24  Yes Randol Simmonds, MD  acetaminophen  (TYLENOL ) 500 MG tablet Take 500 mg by mouth as needed.    [provider]  amLODipine  (NORVASC ) 10 MG tablet Take 1 tablet by mouth once daily 10/01/23   Adella Norris, MD  atorvastatin  (LIPITOR) 40 MG tablet TAKE ONE TABLET BY MOUTH ONCE DAILY STOP ZOCOR  04/08/23   Trudy Birmingham, PA-C  blood glucose meter kit and supplies Dispense based on patient and insurance preference. Use up to four times daily as directed. (FOR ICD-10 E10.9, E11.9). 09/17/21   Jillian Buttery, MD  Blood Glucose Monitoring Suppl (ONETOUCH VERIO) w/Device KIT Check blood sugars twice daily before meals 11/06/19   Adella Norris, MD  Calcium  Citrate 250 MG TABS 2 tabs by mouth twice daily 02/23/23   Adella Norris, MD  Cholecalciferol  (VITAMIN D3) 25 MCG (1000 UT) CAPS Take 1 capsule (1,000 Units total) by mouth daily. 02/23/23   Adella Norris, MD  clopidogrel  (PLAVIX ) 75 MG tablet Take 1 tablet by mouth once daily 09/03/23   Thukkani, Arun K, MD  cyclobenzaprine  (FLEXERIL ) 10 MG tablet 1/2  to 1 tab by mouth twice daily as needed for muscle pain and spasm 02/19/23   Adella Norris, MD  empagliflozin  (JARDIANCE ) 10 MG TABS tablet Take 1 tablet (10 mg total) by mouth daily before breakfast. 11/23/23   Williams, Evan, PA-C  EPINEPHrine  (EPIPEN  2-PAK) 0.3 mg/0.3 mL IJ SOAJ injection Inject 0.3 mLs (0.3 mg total) into the muscle as needed for anaphylaxis. 02/01/19   Adella Norris, MD  ezetimibe  (ZETIA ) 10 MG tablet Take 1 tablet by mouth once daily 10/22/23   Thukkani, Arun K, MD  glucose blood (ONETOUCH VERIO) test strip Check blood glucose twice daily before meals 04/18/20   Adella Norris, MD  Insulin  Pen Needle (RELION PEN NEEDLES) 31G X 6 MM MISC USE 1  ONCE DAILY 08/13/23   Adella Norris, MD  Lancets [facility] ULTRASOFT) lancets Check blood sugar twice daily before meals 04/18/20   Adella Norris, MD  LANTUS  SOLOSTAR 100 UNIT/ML Solostar Pen INJECT 20 UNITS SUBCUTANEOUSLY BEFORE BREAKFAST IN THE MORNING 01/03/24   Adella Norris, MD  losartan -hydrochlorothiazide  (HYZAAR) 100-25 MG tablet Take 1 tablet by mouth once daily 05/04/23   Adella Norris, MD  metoprolol  succinate (TOPROL -XL) 100 MG 24 hr tablet TAKE 1 TABLET BY MOUTH ONCE DAILY WITH  OR  FOLLOWING  A  MEAL 06/07/23   Adella Norris, MD  nitroGLYCERIN  (NITROSTAT ) 0.4 MG SL tablet 1 tab under tongue as needed for Chest discomfort and repeat every 5 minutes x  2 if discomfort not relieved 08/06/21   Adella Norris, MD  spironolactone  (ALDACTONE ) 25 MG tablet Take 1 tablet by mouth once daily 12/08/22   Thukkani, Arun K, MD  tirzepatide  (MOUNJARO ) 12.5 MG/0.5ML Pen Inject 12.5 mg into the skin once a week. 08/12/23   Adella Norris, MD    Allergies: Aspirin, Ibuprofen, Hydralazine , and Ace inhibitors    Review of Systems  Updated Vital Signs BP 124/74   Pulse 65   Temp 97.6 F (36.4 C)   Resp 17   Ht 1.765 m (5' 9.5)   Wt (!) 148.4 kg   SpO2 96%   BMI 47.62 kg/m   Physical  Exam Vitals and nursing note reviewed.  Constitutional:      General: She is not in acute distress.    Appearance: She is well-developed.  HENT:     Head: Normocephalic and atraumatic.     Right Ear: External ear normal.     Left Ear: External ear normal.  Eyes:     General: No scleral icterus.       Right eye: No discharge.        Left eye: No discharge.     Conjunctiva/sclera: Conjunctivae normal.  Neck:     Trachea: No tracheal deviation.  Cardiovascular:     Rate and Rhythm: Normal rate and regular rhythm.  Pulmonary:     Effort: Pulmonary effort is normal. No respiratory distress.     Breath sounds: Normal breath sounds. No stridor. No wheezing or rales.  Abdominal:     General: Bowel sounds are normal. There is no distension.     Palpations: Abdomen is soft.     Tenderness: There is no abdominal tenderness. There is no guarding or rebound.  Musculoskeletal:        General: No deformity.     Cervical back: Neck supple. Tenderness present.     Thoracic back: No tenderness.     Lumbar back: No tenderness.     Right knee: Bony tenderness present. Tenderness present.     Comments: No hip tenderness no ankle tenderness, no tenderness palpation right shoulder elbow  Skin:    General: Skin is warm and dry.     Findings: No rash.  Neurological:     General: No focal deficit present.     Mental Status: She is alert.     Cranial Nerves: No cranial nerve deficit, dysarthria or facial asymmetry.     Sensory: No sensory deficit.     Motor: No abnormal muscle tone or seizure activity.     Coordination: Coordination normal.  Psychiatric:        Mood and Affect: Mood normal.     (all labs ordered are listed, but only abnormal results are displayed) Labs Reviewed - No data to display  EKG: None  Radiology: CT Head Wo Contrast Result Date: 01/10/2024 EXAM: CT HEAD WITHOUT 01/10/2024 08:28:36 PM TECHNIQUE: CT of the head was performed without the administration of  intravenous contrast. Automated exposure control, iterative reconstruction, and/or weight based adjustment of the mA/kV was utilized to reduce the radiation dose to as low as reasonably achievable. COMPARISON: 07/23/2014 CLINICAL HISTORY: Head trauma, moderate-severe FINDINGS: BRAIN AND VENTRICLES: No acute intracranial hemorrhage. No mass effect or midline shift. No extra-axial fluid collection. No evidence of acute infarct. No hydrocephalus. Subcortical and periventricular small vessel ischemic changes. ORBITS: No acute abnormality. SINUSES AND MASTOIDS: No acute abnormality. SOFT TISSUES AND SKULL: No acute skull fracture. No acute soft tissue  abnormality. IMPRESSION: 1. No acute intracranial abnormality. Electronically signed by: Pinkie Pebbles MD 01/10/2024 08:35 PM EST RP Workstation: [id number]   CT Cervical Spine Wo Contrast Result Date: 01/10/2024 EXAM: CT CERVICAL SPINE WITHOUT CONTRAST 01/10/2024 08:28:36 PM TECHNIQUE: CT of the cervical spine was performed without the administration of intravenous contrast. Multiplanar reformatted images are provided for review. Automated exposure control, iterative reconstruction, and/or weight based adjustment of the mA/kV was utilized to reduce the radiation dose to as low as reasonably achievable. COMPARISON: None available. CLINICAL HISTORY: Neck trauma, midline tenderness (Age 41-64y) FINDINGS: BONES AND ALIGNMENT: No acute fracture or traumatic malalignment. DEGENERATIVE CHANGES: Mild degenerative changes of the mid cervical spine. SOFT TISSUES: No prevertebral soft tissue swelling. IMPRESSION: 1. No acute cervical spine abnormality. Electronically signed by: Pinkie Pebbles MD 01/10/2024 08:34 PM EST RP Workstation: [id number]   DG Knee Complete 4 Views Right Result Date: 01/10/2024 EXAM: 4 VIEW(S) XRAY OF THE RIGHT KNEE 01/10/2024 08:00:00 PM COMPARISON: Comparison with 08/06/2009. CLINICAL HISTORY: Fall, pain. Pain after trip and fall injury.  FINDINGS: BONES AND JOINTS: Inguinal ossicle over the lateral aspect of the lateral tibial plateau likely representing an avulsion fracture. This suggests a Segond fracture, which may be associated with ACL tears. No malalignment. No significant joint effusion. No dislocation. No significant degenerative changes. SOFT TISSUES: The soft tissues are unremarkable. IMPRESSION: 1. Avulsion fracture at the lateral tibial plateau compatible with a Segond fracture, which can be associated with ACL tears. 2. No significant effusion or dislocation. Electronically signed by: Elsie Gravely MD 01/10/2024 08:06 PM EST RP Workstation: [id number]     Procedures   Medications Ordered in the ED  oxyCODONE -acetaminophen  (PERCOCET/ROXICET) 5-325 MG per tablet 1 tablet (has no administration in time range)  oxyCODONE -acetaminophen  (PERCOCET/ROXICET) 5-325 MG per tablet 1 tablet (1 tablet Oral Given 01/10/24 1945)    Clinical Course as of 01/10/24 2154  Mon Jan 10, 2024  2134 Head CT negative [JK]  2134 C spine negative [JK]  2134 . Avulsion fracture at the lateral tibial plateau compatible with a Segond fracture, which can be associated with ACL tears.   [JK]    Clinical Course User Index [JK] Randol Simmonds, MD                                 Medical Decision Making Problems Addressed: Injury of right knee, initial encounter: acute illness or injury that poses a threat to life or bodily functions  Amount and/or Complexity of Data Reviewed Radiology: ordered and independent interpretation performed.  Risk Prescription drug management.   Patient presented to the ED for evaluation after a fall.  Patient did hit her head and was complaining of neck pain.  CT scan of head and neck fortunately did not show any signs of serious injury.  Patient was also complaining of pain.  X-rays show an avulsion fracture at the lateral tibial plateau which can be associated with ACL injuries.  Patient provided knee  immobilizer and crutches.  Stable for outpatient follow-up with orthopedics.     Final diagnoses:  Injury of right knee, initial encounter    ED Discharge Orders          Ordered    For home use only DME 4 wheeled rolling walker with seat        01/10/24 2152    HYDROcodone -acetaminophen  (NORCO/VICODIN) 5-325 MG tablet  Every 6 hours PRN  01/10/24 2153               Randol Simmonds, MD 01/10/24 2154

## 2024-01-10 NOTE — ED Triage Notes (Signed)
 BIB EMS from home for mechanical fall tripping on rug. C/o right knee pain. Pt did hit head, is on plavix . No LOC. A&Ox4 on arrival. C/o headache.

## 2024-01-18 ENCOUNTER — Other Ambulatory Visit: Payer: Self-pay | Admitting: Internal Medicine

## 2024-02-11 ENCOUNTER — Encounter: Payer: Self-pay | Admitting: Gastroenterology

## 2024-02-11 ENCOUNTER — Other Ambulatory Visit (INDEPENDENT_AMBULATORY_CARE_PROVIDER_SITE_OTHER): Payer: 59

## 2024-02-11 DIAGNOSIS — Z794 Long term (current) use of insulin: Secondary | ICD-10-CM

## 2024-02-11 DIAGNOSIS — E1165 Type 2 diabetes mellitus with hyperglycemia: Secondary | ICD-10-CM | POA: Diagnosis not present

## 2024-02-11 DIAGNOSIS — E1169 Type 2 diabetes mellitus with other specified complication: Secondary | ICD-10-CM

## 2024-02-11 DIAGNOSIS — E785 Hyperlipidemia, unspecified: Secondary | ICD-10-CM

## 2024-02-11 NOTE — Progress Notes (Signed)
 Only able to withdraw one Lavender. Sent for A1C and CBC. She has to come again for rest of labs

## 2024-02-12 LAB — CBC WITH DIFFERENTIAL/PLATELET
Basophils Absolute: 0.1 x10E3/uL (ref 0.0–0.2)
Basos: 1 %
EOS (ABSOLUTE): 0.2 x10E3/uL (ref 0.0–0.4)
Eos: 2 %
Hematocrit: 42.1 % (ref 34.0–46.6)
Hemoglobin: 13.7 g/dL (ref 11.1–15.9)
Immature Grans (Abs): 0 x10E3/uL (ref 0.0–0.1)
Immature Granulocytes: 0 %
Lymphocytes Absolute: 2.1 x10E3/uL (ref 0.7–3.1)
Lymphs: 23 %
MCH: 26.6 pg (ref 26.6–33.0)
MCHC: 32.5 g/dL (ref 31.5–35.7)
MCV: 82 fL (ref 79–97)
Monocytes Absolute: 0.5 x10E3/uL (ref 0.1–0.9)
Monocytes: 5 %
Neutrophils Absolute: 6.4 x10E3/uL (ref 1.4–7.0)
Neutrophils: 69 %
Platelets: 311 x10E3/uL (ref 150–450)
RBC: 5.15 x10E6/uL (ref 3.77–5.28)
RDW: 13.4 % (ref 11.7–15.4)
WBC: 9.2 x10E3/uL (ref 3.4–10.8)

## 2024-02-12 LAB — HEMOGLOBIN A1C
Est. average glucose Bld gHb Est-mCnc: 126 mg/dL
Hgb A1c MFr Bld: 6 % — ABNORMAL HIGH (ref 4.8–5.6)

## 2024-02-15 ENCOUNTER — Ambulatory Visit (INDEPENDENT_AMBULATORY_CARE_PROVIDER_SITE_OTHER): Payer: 59 | Admitting: Internal Medicine

## 2024-02-15 ENCOUNTER — Encounter: Payer: Self-pay | Admitting: Internal Medicine

## 2024-02-15 VITALS — BP 130/82 | HR 62 | Resp 14 | Ht 70.0 in | Wt 324.0 lb

## 2024-02-15 DIAGNOSIS — Z23 Encounter for immunization: Secondary | ICD-10-CM

## 2024-02-15 DIAGNOSIS — E1165 Type 2 diabetes mellitus with hyperglycemia: Secondary | ICD-10-CM

## 2024-02-15 DIAGNOSIS — N182 Chronic kidney disease, stage 2 (mild): Secondary | ICD-10-CM | POA: Diagnosis not present

## 2024-02-15 DIAGNOSIS — E785 Hyperlipidemia, unspecified: Secondary | ICD-10-CM | POA: Diagnosis not present

## 2024-02-15 DIAGNOSIS — E1169 Type 2 diabetes mellitus with other specified complication: Secondary | ICD-10-CM | POA: Diagnosis not present

## 2024-02-15 DIAGNOSIS — Z794 Long term (current) use of insulin: Secondary | ICD-10-CM

## 2024-02-15 DIAGNOSIS — E66813 Obesity, class 3: Secondary | ICD-10-CM

## 2024-02-15 DIAGNOSIS — Z0000 Encounter for general adult medical examination without abnormal findings: Secondary | ICD-10-CM | POA: Diagnosis not present

## 2024-02-15 DIAGNOSIS — I10 Essential (primary) hypertension: Secondary | ICD-10-CM | POA: Diagnosis not present

## 2024-02-15 DIAGNOSIS — Z6841 Body Mass Index (BMI) 40.0 and over, adult: Secondary | ICD-10-CM

## 2024-02-15 MED ORDER — LANTUS SOLOSTAR 100 UNIT/ML ~~LOC~~ SOPN: PEN_INJECTOR | SUBCUTANEOUS | 7 refills | Status: AC

## 2024-02-15 MED ORDER — TIRZEPATIDE 15 MG/0.5ML ~~LOC~~ SOAJ: 15.0000 mg | SUBCUTANEOUS | 3 refills | Status: AC

## 2024-02-15 NOTE — Patient Instructions (Signed)
 Please obtain COVID vaccine at pharmacy

## 2024-02-15 NOTE — Progress Notes (Signed)
 "   Subjective:    Patient ID: Rebecca Avila, female   DOB: March 29, 1965, 59 y.o.   MRN: [id number]   HPI  CPE with pap  1.  Pap:  Normal before total vaginal hysterectomy in 2007  2.  Mammogram:  Last 10/2023 and normal.  No family history of breast cancer.    3.  Osteoprevention:  DEXA in 2021 showing osteopenia.  Taking Calcium  citrate 500 mg twice daily and Vitamin D3 1000 units daily, but Vitamin D  level never improves from just under 11.    4.  Guaiac Cards/FIT:  Never returned  5.  Colonoscopy:  First screening colonoscopy planned for this Friday.  Plavix  on hold   6.  Immunizations:  Has not had influenza and has never obtained.  She will need to think about it.  Has not had COVID nor shingrix . Immunization History  Administered Date(s) Administered   PFIZER Comirnaty(Gray Top)Covid-19 Tri-Sucrose Vaccine 06/05/2020   PFIZER(Purple Top)SARS-COV-2 Vaccination 09/25/2019, 10/24/2019   PNEUMOCOCCAL CONJUGATE-20 02/11/2023   Pfizer Covid-19 Vaccine Bivalent Booster 43yrs & up 10/18/2020   Pneumococcal Polysaccharide-23 11/06/2019   Td 11/06/2019     7.  Glucose/Cholesterol:  Did not get chemistries with blood draws as difficulty drawing blood las week.   A1C is good at 6.0%.   Cholesterol at goal last year save for low HDL at 37.   She feels she eats well, but not physically active.   Hurts to walk with her right knee.    8.  Right knee pain:  [facility] is asking her to lose weight and she may need to have knee surgery.  Had a cortisone shot in right knee about 1 week ago, which has helped significantly.      Current Meds  Medication Sig   acetaminophen  (TYLENOL ) 500 MG tablet Take 500 mg by mouth as needed.   amLODipine  (NORVASC ) 10 MG tablet Take 1 tablet by mouth once daily   atorvastatin  (LIPITOR) 40 MG tablet TAKE ONE TABLET BY MOUTH ONCE DAILY STOP ZOCOR    blood glucose meter kit and supplies Dispense based on patient and insurance preference. Use up to four  times daily as directed. (FOR ICD-10 E10.9, E11.9).   Blood Glucose Monitoring Suppl (ONETOUCH VERIO) w/Device KIT Check blood sugars twice daily before meals   Calcium  Citrate 250 MG TABS 2 tabs by mouth twice daily   Cholecalciferol  (VITAMIN D3) 25 MCG (1000 UT) CAPS Take 1 capsule (1,000 Units total) by mouth daily.   cyclobenzaprine  (FLEXERIL ) 10 MG tablet 1/2 to 1 tab by mouth twice daily as needed for muscle pain and spasm   empagliflozin  (JARDIANCE ) 10 MG TABS tablet Take 1 tablet (10 mg total) by mouth daily before breakfast.   ezetimibe  (ZETIA ) 10 MG tablet Take 1 tablet by mouth once daily   glucose blood (ONETOUCH VERIO) test strip Check blood glucose twice daily before meals   Insulin  Pen Needle (RELION PEN NEEDLES) 31G X 6 MM MISC USE 1  ONCE DAILY   Lancets (ONETOUCH ULTRASOFT) lancets Check blood sugar twice daily before meals   LANTUS  SOLOSTAR 100 UNIT/ML Solostar Pen INJECT 20 UNITS SUBCUTANEOUSLY BEFORE BREAKFAST IN THE MORNING   losartan -hydrochlorothiazide  (HYZAAR) 100-25 MG tablet Take 1 tablet by mouth once daily   metoprolol  succinate (TOPROL -XL) 100 MG 24 hr tablet TAKE 1 TABLET BY MOUTH ONCE DAILY WITH  OR  FOLLOWING  A  MEAL   spironolactone  (ALDACTONE ) 25 MG tablet Take 1 tablet by mouth once daily  tirzepatide  (MOUNJARO ) 12.5 MG/0.5ML Pen Inject 12.5 mg into the skin once a week.   Allergies  Allergen Reactions   Aspirin Anaphylaxis   Ibuprofen Anaphylaxis   Hydralazine  Other (See Comments)    polyarthralgias   Ace Inhibitors Cough   Past Medical History:  Diagnosis Date   Anemia    Anxiety    Chest pain 2023   Was seen and now followed by Dr Wendel 07/2021.  echo normal, never went for cardiac CT.  Chest pain resolved following medication changes for BP, Plavix  as ASA allergy and DM med changes.   CKD (chronic kidney disease)    Depression    Diabetes mellitus without complication (HCC)    type 2    Environmental allergies    Hyperlipidemia     Hypertension    Morbid (severe) obesity due to excess calories (HCC)    bmi 47.63   Multinodular goiter    01/2023:  required follow up US  in 01/2024 to follow for stability of 2 nodules   Obesity    Osteopenia 2021   associated with primary hyperparathyroidism   Primary hyperparathyroidism 2021   Hypercalcemia from 2014 with significant increase in 2020 and elevated PTH.  Left parathyroid  adenoma excised in 2022   Sciatica    Past Surgical History:  Procedure Laterality Date   PARATHYROIDECTOMY Left 03/29/2020   Procedure: LEFT INFERIOR PARATHYROIDECTOMY;  Surgeon: Eletha Boas, MD;  Location: [facility];  Service: General;  Laterality: Left;  ROOM 1 STARTING AT 12:00PM FOR 90 MIN   TOTAL VAGINAL HYSTERECTOMY  2007   For benign reasons--fibroids   Family History  Problem Relation Age of Onset   Lung cancer Mother        Heavy smoker   Lung cancer Father        Not sure if he also died of lung CA--also a smoker   Hypertension Father    Cancer Brother        congenital tumor of spine--malignant   Mental illness Brother        She believes he has been diagnosed with Schizophrenia   Autism spectrum disorder Brother    High blood pressure Paternal Aunt    High blood pressure Paternal Grandmother    Diabetes Neg Hx    Thyroid  disease Neg Hx    Breast cancer Neg Hx    Social History   Socioeconomic History   Marital status: Married    Spouse name: Lamar   Number of children: 1   Years of education: Not on file   Highest education level: Associate degree: academic program  Occupational History   Not on file  Tobacco Use   Smoking status: Former    Current packs/day: 0.00    Average packs/day: 0.3 packs/day for 28.2 years (7.0 ttl pk-yrs)    Types: Cigarettes    Start date: 01/27/1992    Quit date: 03/29/2020    Years since quitting: 3.8    Passive exposure: Past   Smokeless tobacco: Never   Tobacco comments:    once monthly  Vaping Use   Vaping status: Never Used   Substance and Sexual Activity   Alcohol use: Yes    Comment: occ   Drug use: Never   Sexual activity: Yes    Birth control/protection: Surgical  Other Topics Concern   Not on file  Social History Narrative   Lives with husband   Daughter lives in [place]   Social Drivers of Health   Tobacco Use:  Medium Risk (02/15/2024)   Patient History    Smoking Tobacco Use: Former    Smokeless Tobacco Use: Never    Passive Exposure: Past  Physicist, Medical Strain: Low Risk (02/15/2024)   Overall Financial Resource Strain (CARDIA)    Difficulty of Paying Living Expenses: Not hard at all  Food Insecurity: No Food Insecurity (02/15/2024)   Epic    Worried About Programme Researcher, Broadcasting/film/video in the Last Year: Never true    Ran Out of Food in the Last Year: Never true  Transportation Needs: No Transportation Needs (02/15/2024)   Epic    Lack of Transportation (Medical): No    Lack of Transportation (Non-Medical): No  Physical Activity: Not on file  Stress: Not on file  Social Connections: Unknown (06/10/2021)   Received from [facility]   Social Network    Social Network: Not on file  Intimate Partner Violence: Not At Risk (02/15/2024)   Epic    Fear of Current or Ex-Partner: No    Emotionally Abused: No    Physically Abused: No    Sexually Abused: No  Depression (PHQ2-9): Low Risk (04/13/2023)   Depression (PHQ2-9)    PHQ-2 Score: 0  Alcohol Screen: Not on file  Housing: Unknown (02/15/2024)   Epic    Unable to Pay for Housing in the Last Year: No    Number of Times Moved in the Last Year: Not on file    Homeless in the Last Year: No  Utilities: Not At Risk (02/15/2024)   Epic    Threatened with loss of utilities: No  Health Literacy: Not on file      Review of Systems  HENT:  Negative for dental problem.   Eyes:  Negative for visual disturbance (Was seen by Dr. Octavia this past year.).  Respiratory:  Negative for shortness of breath.   Cardiovascular:  Negative for chest pain (Does walk  for 30 minutes daily without CP.), palpitations and leg swelling.  Gastrointestinal:  Negative for abdominal pain and blood in stool (No melena).  Endocrine:       Multinodular goiter with 2 nodules requiring US  follow up in 1 year.  She has appt 1/26 for that.  Genitourinary:  Negative for vaginal discharge.  Musculoskeletal:  Positive for arthralgias (right knee as in history.).  Neurological:  Positive for numbness (Bilateral lateral and anterior thigh numbness if standing in kitchen too long (cooking)).  Psychiatric/Behavioral:  Negative for dysphoric mood. The patient is not nervous/anxious.       Objective:   BP 130/82 (BP Location: Right Arm, Patient Position: Sitting, Cuff Size: Large)   Pulse 62   Resp 14   Ht 5' 10 (1.778 m)   Wt (!) 324 lb (147 kg)   BMI 46.49 kg/m   Physical Exam Constitutional:      Appearance: She is obese.  HENT:     Head: Normocephalic and atraumatic.     Right Ear: Tympanic membrane, ear canal and external ear normal.     Left Ear: Tympanic membrane, ear canal and external ear normal.     Nose: Nose normal.     Mouth/Throat:     Mouth: Mucous membranes are moist.     Pharynx: Oropharynx is clear.  Eyes:     Extraocular Movements: Extraocular movements intact.     Conjunctiva/sclera: Conjunctivae normal.     Pupils: Pupils are equal, round, and reactive to light.  Neck:     Thyroid : Thyroid  mass (generally nodular) and  thyromegaly present.  Cardiovascular:     Rate and Rhythm: Normal rate and regular rhythm.     Pulses:          Dorsalis pedis pulses are 2+ on the right side and 2+ on the left side.       Posterior tibial pulses are 2+ on the right side and 2+ on the left side.     Heart sounds: S1 normal and S2 normal. No murmur heard.    No friction rub. No S3 or S4 sounds.     Comments: No carotid bruits.  Carotid, radial, femoral, DP and PT pulses normal and equal.   Pulmonary:     Effort: Pulmonary effort is normal.     Breath  sounds: Normal breath sounds and air entry.  Chest:  Breasts:    Right: No inverted nipple, mass or nipple discharge.     Left: No inverted nipple, mass or nipple discharge.  Abdominal:     General: Bowel sounds are normal.     Palpations: Abdomen is soft. There is no hepatomegaly, splenomegaly or mass.     Tenderness: There is no abdominal tenderness.     Hernia: No hernia is present.  Genitourinary:    Comments: Declined exam Musculoskeletal:        General: Normal range of motion.     Cervical back: Normal range of motion and neck supple.     Right lower leg: No edema.     Left lower leg: No edema.  Feet:     Right foot:     Protective Sensation: 10 sites tested.  10 sites sensed.     Skin integrity: Skin integrity normal.     Toenail Condition: Right toenails are normal.     Left foot:     Protective Sensation: 10 sites tested.  10 sites sensed.     Skin integrity: Skin integrity normal.     Toenail Condition: Left toenails are normal.  Lymphadenopathy:     Head:     Right side of head: No submental or submandibular adenopathy.     Left side of head: No submental or submandibular adenopathy.     Cervical: No cervical adenopathy.     Upper Body:     Right upper body: No supraclavicular or axillary adenopathy.     Left upper body: No supraclavicular or axillary adenopathy.     Lower Body: No right inguinal adenopathy. No left inguinal adenopathy.  Skin:    General: Skin is warm.     Capillary Refill: Capillary refill takes less than 2 seconds.     Findings: No rash.  Neurological:     General: No focal deficit present.     Mental Status: She is alert and oriented to person, place, and time.     Cranial Nerves: Cranial nerves 2-12 are intact.     Sensory: Sensation is intact.     Motor: Motor function is intact.     Coordination: Coordination is intact.     Gait: Gait is intact.     Deep Tendon Reflexes: Reflexes are normal and symmetric.  Psychiatric:        Mood  and Affect: Mood normal.        Behavior: Behavior normal.      Assessment & Plan   CPE without pap Mammogram again in October Colonoscopy this week Shingrix  #1/2 to repeat in 6 months Declined influenza Encouraged COVID to be obtained at pharmacy  2.  DM:  still using Lantus  and would like to wean.  Increase Mounjaro  to 15 mg and wean Lantus  by 2 units daily as long as sugar below 125.  She will not be giving the higher dose of Mounjaro  until next Monday, almost a week from now, but should drop insulin  to at least 10 units by then if not already there.  Follow up 2 weeks after starting higher dose.  Continue Jardiance .   Urine microalbumin/crea  3.  Hyperlipidemia:  FLP CMP  4.  Hypertension:  fair control.  5.  OA of right knee:  needs to find a place for water exercise and recumbent bike and get started on daily routine.  [facility] is an option.  Needs weight loss  6.  Multinodular goiter:  has US  appt to follow up on 2 nodules on the 26th.  7.  CKD:  Jardiance , BP control, Diabetic control.  CMP   "

## 2024-02-16 LAB — MICROALBUMIN / CREATININE URINE RATIO
Creatinine, Urine: 67.2 mg/dL
Microalb/Creat Ratio: 20 mg/g{creat} (ref 0–29)
Microalbumin, Urine: 13.4 ug/mL

## 2024-02-16 LAB — LIPID PANEL W/O CHOL/HDL RATIO
Cholesterol, Total: 117 mg/dL (ref 100–199)
HDL: 48 mg/dL
LDL Chol Calc (NIH): 58 mg/dL (ref 0–99)
Triglycerides: 45 mg/dL (ref 0–149)
VLDL Cholesterol Cal: 11 mg/dL (ref 5–40)

## 2024-02-16 LAB — COMPREHENSIVE METABOLIC PANEL WITH GFR
ALT: 11 IU/L (ref 0–32)
AST: 11 IU/L (ref 0–40)
Albumin: 4.3 g/dL (ref 3.8–4.9)
Alkaline Phosphatase: 97 IU/L (ref 49–135)
BUN/Creatinine Ratio: 19 (ref 9–23)
BUN: 27 mg/dL — ABNORMAL HIGH (ref 6–24)
Bilirubin Total: 0.3 mg/dL (ref 0.0–1.2)
CO2: 23 mmol/L (ref 20–29)
Calcium: 9.5 mg/dL (ref 8.7–10.2)
Chloride: 104 mmol/L (ref 96–106)
Creatinine, Ser: 1.44 mg/dL — ABNORMAL HIGH (ref 0.57–1.00)
Globulin, Total: 3.3 g/dL (ref 1.5–4.5)
Glucose: 73 mg/dL (ref 70–99)
Potassium: 4.7 mmol/L (ref 3.5–5.2)
Sodium: 141 mmol/L (ref 134–144)
Total Protein: 7.6 g/dL (ref 6.0–8.5)
eGFR: 42 mL/min/1.73 — ABNORMAL LOW

## 2024-02-18 ENCOUNTER — Ambulatory Visit: Admitting: Gastroenterology

## 2024-02-18 ENCOUNTER — Encounter: Payer: Self-pay | Admitting: Gastroenterology

## 2024-02-18 VITALS — BP 125/75 | HR 67 | Temp 97.3°F | Resp 10 | Ht 69.5 in | Wt 327.0 lb

## 2024-02-18 DIAGNOSIS — K635 Polyp of colon: Secondary | ICD-10-CM | POA: Diagnosis not present

## 2024-02-18 DIAGNOSIS — D124 Benign neoplasm of descending colon: Secondary | ICD-10-CM

## 2024-02-18 DIAGNOSIS — D125 Benign neoplasm of sigmoid colon: Secondary | ICD-10-CM

## 2024-02-18 DIAGNOSIS — K5730 Diverticulosis of large intestine without perforation or abscess without bleeding: Secondary | ICD-10-CM

## 2024-02-18 DIAGNOSIS — Z1211 Encounter for screening for malignant neoplasm of colon: Secondary | ICD-10-CM

## 2024-02-18 DIAGNOSIS — D128 Benign neoplasm of rectum: Secondary | ICD-10-CM

## 2024-02-18 DIAGNOSIS — D122 Benign neoplasm of ascending colon: Secondary | ICD-10-CM | POA: Diagnosis not present

## 2024-02-18 DIAGNOSIS — K641 Second degree hemorrhoids: Secondary | ICD-10-CM | POA: Diagnosis not present

## 2024-02-18 DIAGNOSIS — K621 Rectal polyp: Secondary | ICD-10-CM

## 2024-02-18 DIAGNOSIS — K644 Residual hemorrhoidal skin tags: Secondary | ICD-10-CM | POA: Diagnosis not present

## 2024-02-18 MED ORDER — SODIUM CHLORIDE 0.9 % IV SOLN: 500.0000 mL | INTRAVENOUS | Status: DC

## 2024-02-18 MED ORDER — DEXTROSE 5 % IV SOLN: INTRAVENOUS | Status: AC

## 2024-02-18 NOTE — Progress Notes (Signed)
 Pt's states no medical or surgical changes since previsit or office visit.

## 2024-02-18 NOTE — Op Note (Signed)
 [facility] Patient Name: Rebecca Avila Procedure Date: 02/18/2024 9:05 AM MRN: [id number] Endoscopist: Aloha Finner , MD, [id number] Age: 59 Referring MD:  Date of Birth: [birth year] Gender: Female Account #: [id number] Procedure:                Colonoscopy Indications:              Screening for colorectal malignant neoplasm Medicines:                Monitored Anesthesia Care Procedure:                Pre-Anesthesia Assessment:                           - Prior to the procedure, a History and Physical                            was performed, and patient medications and                            allergies were reviewed. The patient's tolerance of                            previous anesthesia was also reviewed. The risks                            and benefits of the procedure and the sedation                            options and risks were discussed with the patient.                            All questions were answered, and informed consent                            was obtained. Prior Anticoagulants: The patient has                            taken Plavix  (clopidogrel ), last dose was 5 days                            prior to procedure. ASA Grade Assessment: III - A                            patient with severe systemic disease. After                            reviewing the risks and benefits, the patient was                            deemed in satisfactory condition to undergo the                            procedure.  After obtaining informed consent, the colonoscope                            was passed under direct vision. Throughout the                            procedure, the patient's blood pressure, pulse, and                            oxygen saturations were monitored continuously. The                            CF [id number] #[id number] was introduced through the anus                            and advanced to the the cecum,  identified by                            appendiceal orifice and ileocecal valve. The                            colonoscopy was performed without difficulty. The                            patient tolerated the procedure. The quality of the                            bowel preparation was adequate. The ileocecal                            valve, appendiceal orifice, and rectum were                            photographed. Scope In: 9:09:31 AM Scope Out: 9:25:57 AM Scope Withdrawal Time: 0 hours 14 minutes 14 seconds  Total Procedure Duration: 0 hours 16 minutes 26 seconds  Findings:                 The digital rectal exam findings include                            hemorrhoids. Pertinent negatives include no                            palpable rectal lesions.                           A large amount of semi-liquid stool was found in                            the entire colon, interfering with visualization.                            Lavage of the area was performed using copious  amounts, resulting in clearance with adequate                            visualization.                           A 35 mm polyp was found in the proximal ascending                            colon. The polyp was granular lateral spreading                            over 1/3 of the wall of the colon. Biopsies were                            taken with a cold forceps for histology. Area on                            contralateral wall just distal was tattooed with an                            injection of Spot (carbon black).                           Two sessile polyps were found in the rectum and                            descending colon. The polyps were 3 to 6 mm in                            size. These polyps were removed with a cold snare.                            Resection and retrieval were complete.                           A 45 mm polyp was found in the sigmoid colon.  The                            polyp was significantly pedunculated (4 cm stalk).                            Biopsies were taken with a cold forceps for                            histology. Area on contralateral wall just distal                            was tattooed with an injection of Spot (carbon                            black).  A few small-mouthed diverticula were found in the                            recto-sigmoid colon and sigmoid colon.                           Non-bleeding non-thrombosed external and internal                            hemorrhoids were found during retroflexion, during                            perianal exam and during digital exam. The                            hemorrhoids were Grade II (internal hemorrhoids                            that prolapse but reduce spontaneously). Complications:            No immediate complications. Estimated Blood Loss:     Estimated blood loss was minimal. Impression:               - Hemorrhoids found on digital rectal exam. Lavaged                            copiously for adequate visualization.                           - Stool in the entire examined colon.                           - One 35 mm polyp in the proximal ascending colon.                            Biopsied. Tattooed.                           - Two 3 to 6 mm polyps in the rectum and in the                            descending colon, removed with a cold snare.                            Resected and retrieved.                           - One 45 mm polyp in the sigmoid colon. Biopsied.                            Tattooed.                           - Diverticulosis in the recto-sigmoid colon and in  the sigmoid colon.                           - Non-bleeding non-thrombosed external and internal                            hemorrhoids. Recommendation:           - The patient will be observed post-procedure,                             until all discharge criteria are met.                           - Discharge patient to home.                           - Patient has a contact number available for                            emergencies. The signs and symptoms of potential                            delayed complications were discussed with the                            patient. Return to normal activities tomorrow.                            Written discharge instructions were provided to the                            patient.                           - High fiber diet.                           - Use FiberCon 1-2 tablets PO daily.                           - Continue present medications.                           - Await pathology results.                           - Repeat colonoscopy in next 3 months for polyp                            resection via EMR in hospital-based setting of 2                            lesions present. 2-day preparation or 1 week of                            daily Miralax  +  Dulcolax with Standard preparation.                           - May restart Plavix  on 1/24.                           - Continue present medications otherwise.                           - Await pathology results.                           - The findings and recommendations were discussed                            with the patient.                           - The findings and recommendations were discussed                            with the patient's family. Aloha Finner, MD 02/18/2024 9:34:59 AM

## 2024-02-18 NOTE — Progress Notes (Signed)
 Dr Wilhelmenia informed of last dose Mounjaro  02/15/24 ,Plavix - 02/13/24& BS= 56, per protocol- D50 1/2 amp iv and d5w started iv by J DeBruler,RN

## 2024-02-18 NOTE — Progress Notes (Signed)
 Called to room to assist during endoscopic procedure.  Patient ID and intended procedure confirmed with present staff. Received instructions for my participation in the procedure from the performing physician.

## 2024-02-18 NOTE — Patient Instructions (Signed)
-  Await pathology results -Handout on polyps, diverticulosis and hemorrhoids provided -You can restart your Plavix  tomorrow (02/19/2024) -Repeat colonoscopy in the next 3 months for polyp resection via EMR in hospital based setting (expect a call from our office)  YOU HAD AN ENDOSCOPIC PROCEDURE TODAY AT THE [facility]:   Refer to the procedure report that was given to you for any specific questions about what was found during the examination.  If the procedure report does not answer your questions, please call your gastroenterologist to clarify.  If you requested that your care partner not be given the details of your procedure findings, then the procedure report has been included in a sealed envelope for you to review at your convenience later.  YOU SHOULD EXPECT: Some feelings of bloating in the abdomen. Passage of more gas than usual.  Walking can help get rid of the air that was put into your GI tract during the procedure and reduce the bloating. If you had a lower endoscopy (such as a colonoscopy or flexible sigmoidoscopy) you may notice spotting of blood in your stool or on the toilet paper. If you underwent a bowel prep for your procedure, you may not have a normal bowel movement for a few days.  Please Note:  You might notice some irritation and congestion in your nose or some drainage.  This is from the oxygen used during your procedure.  There is no need for concern and it should clear up in a day or so.  SYMPTOMS TO REPORT IMMEDIATELY:  Following lower endoscopy (colonoscopy or flexible sigmoidoscopy):  Excessive amounts of blood in the stool  Significant tenderness or worsening of abdominal pains  Swelling of the abdomen that is new, acute  Fever of 100F or higher  For urgent or emergent issues, a gastroenterologist can be reached at any hour by calling [phone]. Do not use MyChart messaging for urgent concerns.    DIET:  We do recommend a small meal at first,  but then you may proceed to your regular diet.  Drink plenty of fluids but you should avoid alcoholic beverages for 24 hours.  ACTIVITY:  You should plan to take it easy for the rest of today and you should NOT DRIVE or use heavy machinery until tomorrow (because of the sedation medicines used during the test).    FOLLOW UP: Our staff will call the number listed on your records the next business day following your procedure.  We will call around 7:15- 8:00 am to check on you and address any questions or concerns that you may have regarding the information given to you following your procedure. If we do not reach you, we will leave a message.     If any biopsies were taken you will be contacted by phone or by letter within the next 1-3 weeks.  Please call us  at [phone] if you have not heard about the biopsies in 3 weeks.    SIGNATURES/CONFIDENTIALITY: You and/or your care partner have signed paperwork which will be entered into your electronic medical record.  These signatures attest to the fact that that the information above on your After Visit Summary has been reviewed and is understood.  Full responsibility of the confidentiality of this discharge information lies with you and/or your care-partner.

## 2024-02-18 NOTE — Progress Notes (Signed)
 "  GASTROENTEROLOGY PROCEDURE H&P NOTE   Primary Care Physician: Adella Norris, MD  HPI: Rebecca Avila is a 59 y.o. female who presents for Screening colonoscopy.  Past Medical History:  Diagnosis Date   Anemia    Anxiety    Chest pain 2023   Was seen and now followed by Dr Wendel 07/2021.  echo normal, never went for cardiac CT.  Chest pain resolved following medication changes for BP, Plavix  as ASA allergy and DM med changes.   CKD (chronic kidney disease)    Depression    Diabetes mellitus without complication (HCC)    type 2    Environmental allergies    Hyperlipidemia    Hypertension    Morbid (severe) obesity due to excess calories (HCC)    bmi 47.63   Multinodular goiter    01/2023:  required follow up US  in 01/2024 to follow for stability of 2 nodules   Obesity    Osteopenia 2021   associated with primary hyperparathyroidism   Primary hyperparathyroidism 2021   Hypercalcemia from 2014 with significant increase in 2020 and elevated PTH.  Left parathyroid  adenoma excised in 2022   Sciatica    Past Surgical History:  Procedure Laterality Date   PARATHYROIDECTOMY Left 03/29/2020   Procedure: LEFT INFERIOR PARATHYROIDECTOMY;  Surgeon: Eletha Boas, MD;  Location: [facility];  Service: General;  Laterality: Left;  ROOM 1 STARTING AT 12:00PM FOR 90 MIN   TOTAL VAGINAL HYSTERECTOMY  2007   For benign reasons--fibroids   Current Outpatient Medications  Medication Sig Dispense Refill   acetaminophen  (TYLENOL ) 500 MG tablet Take 500 mg by mouth as needed.     amLODipine  (NORVASC ) 10 MG tablet Take 1 tablet by mouth once daily 90 tablet 3   atorvastatin  (LIPITOR) 40 MG tablet TAKE ONE TABLET BY MOUTH ONCE DAILY STOP ZOCOR  90 tablet 3   blood glucose meter kit and supplies Dispense based on patient and insurance preference. Use up to four times daily as directed. (FOR ICD-10 E10.9, E11.9). 1 each 0   Blood Glucose Monitoring Suppl (ONETOUCH VERIO) w/Device KIT Check blood  sugars twice daily before meals 1 kit 0   Calcium  Citrate 250 MG TABS 2 tabs by mouth twice daily 120 tablet 11   Cholecalciferol  (VITAMIN D3) 25 MCG (1000 UT) CAPS Take 1 capsule (1,000 Units total) by mouth daily. 30 capsule 11   clopidogrel  (PLAVIX ) 75 MG tablet Take 1 tablet by mouth once daily (Patient not taking: Reported on 02/15/2024) 90 tablet 1   cyclobenzaprine  (FLEXERIL ) 10 MG tablet 1/2 to 1 tab by mouth twice daily as needed for muscle pain and spasm 60 tablet 0   empagliflozin  (JARDIANCE ) 10 MG TABS tablet Take 1 tablet (10 mg total) by mouth daily before breakfast. 30 tablet 3   EPINEPHrine  (EPIPEN  2-PAK) 0.3 mg/0.3 mL IJ SOAJ injection Inject 0.3 mLs (0.3 mg total) into the muscle as needed for anaphylaxis. 1 each 1   ezetimibe  (ZETIA ) 10 MG tablet Take 1 tablet by mouth once daily 90 tablet 1   glucose blood (ONETOUCH VERIO) test strip Check blood glucose twice daily before meals 300 each 3   Insulin  Pen Needle (RELION PEN NEEDLES) 31G X 6 MM MISC USE 1  ONCE DAILY 50 each 11   Lancets (ONETOUCH ULTRASOFT) lancets Check blood sugar twice daily before meals 300 each 3   LANTUS  SOLOSTAR 100 UNIT/ML Solostar Pen INJECT 10 UNITS SUBCUTANEOUSLY BEFORE BREAKFAST IN THE MORNING 15 mL 7  losartan -hydrochlorothiazide  (HYZAAR) 100-25 MG tablet Take 1 tablet by mouth once daily 90 tablet 3   metoprolol  succinate (TOPROL -XL) 100 MG 24 hr tablet TAKE 1 TABLET BY MOUTH ONCE DAILY WITH  OR  FOLLOWING  A  MEAL 90 tablet 3   nitroGLYCERIN  (NITROSTAT ) 0.4 MG SL tablet 1 tab under tongue as needed for Chest discomfort and repeat every 5 minutes x 2 if discomfort not relieved (Patient not taking: Reported on 02/15/2024) 25 tablet 2   spironolactone  (ALDACTONE ) 25 MG tablet Take 1 tablet by mouth once daily 90 tablet 0   tirzepatide  (MOUNJARO ) 15 MG/0.5ML Pen Inject 15 mg into the skin once a week. 6 mL 3   No current facility-administered medications for this visit.   Current  Medications[1] Allergies[2] Family History  Problem Relation Age of Onset   Lung cancer Mother        Heavy smoker   Lung cancer Father        Not sure if he also died of lung CA--also a smoker   Hypertension Father    Cancer Brother        congenital tumor of spine--malignant   Mental illness Brother        She believes he has been diagnosed with Schizophrenia   Autism spectrum disorder Brother    High blood pressure Paternal Aunt    High blood pressure Paternal Grandmother    Diabetes Neg Hx    Thyroid  disease Neg Hx    Breast cancer Neg Hx    Social History   Socioeconomic History   Marital status: Married    Spouse name: Lamar   Number of children: 1   Years of education: Not on file   Highest education level: Associate degree: academic program  Occupational History   Not on file  Tobacco Use   Smoking status: Former    Current packs/day: 0.00    Average packs/day: 0.3 packs/day for 28.2 years (7.0 ttl pk-yrs)    Types: Cigarettes    Start date: 01/27/1992    Quit date: 03/29/2020    Years since quitting: 3.8    Passive exposure: Past   Smokeless tobacco: Never   Tobacco comments:    once monthly  Vaping Use   Vaping status: Never Used  Substance and Sexual Activity   Alcohol use: Yes    Comment: occ   Drug use: Never   Sexual activity: Yes    Birth control/protection: Surgical  Other Topics Concern   Not on file  Social History Narrative   Lives with husband   Daughter lives in [place]   Social Drivers of Health   Tobacco Use: Medium Risk (02/15/2024)   Patient History    Smoking Tobacco Use: Former    Smokeless Tobacco Use: Never    Passive Exposure: Past  Physicist, Medical Strain: Low Risk (02/15/2024)   Overall Financial Resource Strain (CARDIA)    Difficulty of Paying Living Expenses: Not hard at all  Food Insecurity: No Food Insecurity (02/15/2024)   Epic    Worried About Radiation Protection Practitioner of Food in the Last Year: Never true    Ran Out of Food in the  Last Year: Never true  Transportation Needs: No Transportation Needs (02/15/2024)   Epic    Lack of Transportation (Medical): No    Lack of Transportation (Non-Medical): No  Physical Activity: Not on file  Stress: Not on file  Social Connections: Unknown (06/10/2021)   [facility]   Social Network  Social Network: Not on file  Intimate Partner Violence: Not At Risk (02/15/2024)   Epic    Fear of Current or Ex-Partner: No    Emotionally Abused: No    Physically Abused: No    Sexually Abused: No  Depression (PHQ2-9): Low Risk (04/13/2023)   Depression (PHQ2-9)    PHQ-2 Score: 0  Alcohol Screen: Not on file  Housing: Unknown (02/15/2024)   Epic    Unable to Pay for Housing in the Last Year: No    Number of Times Moved in the Last Year: Not on file    Homeless in the Last Year: No  Utilities: Not At Risk (02/15/2024)   Epic    Threatened with loss of utilities: No  Health Literacy: Not on file    Physical Exam: There were no vitals filed for this visit. There is no height or weight on file to calculate BMI. GEN: NAD EYE: Sclerae anicteric ENT: MMM CV: Non-tachycardic GI: Soft, NT/ND NEURO:  Alert & Oriented x 3  Lab Results: No results for input(s): WBC, HGB, HCT, PLT in the last 72 hours. BMET Recent Labs    02/15/24 0958  NA 141  K 4.7  CL 104  CO2 23  GLUCOSE 73  BUN 27*  CREATININE 1.44*  CALCIUM  9.5   LFT Recent Labs    02/15/24 0958  PROT 7.6  ALBUMIN 4.3  AST 11  ALT 11  ALKPHOS 97  BILITOT 0.3   PT/INR No results for input(s): LABPROT, INR in the last 72 hours.   Impression / Plan: This is a 59 y.o.female who presents for Screening colonoscopy.  The risks and benefits of endoscopic evaluation/treatment were discussed with the patient and/or family; these include but are not limited to the risk of perforation, infection, bleeding, missed lesions, lack of diagnosis, severe illness requiring hospitalization, as well  as anesthesia and sedation related illnesses.  The patient's history has been reviewed, patient examined, no change in status, and deemed stable for procedure.  The patient and/or family was provided an opportunity to ask questions and all were answered.  The patient and/or family is agreeable to proceed.    Aloha Finner, MD [facility] Office # [phone]    [1]  Current Outpatient Medications:    acetaminophen  (TYLENOL ) 500 MG tablet, Take 500 mg by mouth as needed., Disp: , Rfl:    amLODipine  (NORVASC ) 10 MG tablet, Take 1 tablet by mouth once daily, Disp: 90 tablet, Rfl: 3   atorvastatin  (LIPITOR) 40 MG tablet, TAKE ONE TABLET BY MOUTH ONCE DAILY STOP ZOCOR , Disp: 90 tablet, Rfl: 3   blood glucose meter kit and supplies, Dispense based on patient and insurance preference. Use up to four times daily as directed. (FOR ICD-10 E10.9, E11.9)., Disp: 1 each, Rfl: 0   Blood Glucose Monitoring Suppl (ONETOUCH VERIO) w/Device KIT, Check blood sugars twice daily before meals, Disp: 1 kit, Rfl: 0   Calcium  Citrate 250 MG TABS, 2 tabs by mouth twice daily, Disp: 120 tablet, Rfl: 11   Cholecalciferol  (VITAMIN D3) 25 MCG (1000 UT) CAPS, Take 1 capsule (1,000 Units total) by mouth daily., Disp: 30 capsule, Rfl: 11   clopidogrel  (PLAVIX ) 75 MG tablet, Take 1 tablet by mouth once daily (Patient not taking: Reported on 02/15/2024), Disp: 90 tablet, Rfl: 1   cyclobenzaprine  (FLEXERIL ) 10 MG tablet, 1/2 to 1 tab by mouth twice daily as needed for muscle pain and spasm, Disp: 60 tablet, Rfl: 0   empagliflozin  (  JARDIANCE ) 10 MG TABS tablet, Take 1 tablet (10 mg total) by mouth daily before breakfast., Disp: 30 tablet, Rfl: 3   EPINEPHrine  (EPIPEN  2-PAK) 0.3 mg/0.3 mL IJ SOAJ injection, Inject 0.3 mLs (0.3 mg total) into the muscle as needed for anaphylaxis., Disp: 1 each, Rfl: 1   ezetimibe  (ZETIA ) 10 MG tablet, Take 1 tablet by mouth once daily, Disp: 90 tablet, Rfl: 1    glucose blood (ONETOUCH VERIO) test strip, Check blood glucose twice daily before meals, Disp: 300 each, Rfl: 3   Insulin  Pen Needle (RELION PEN NEEDLES) 31G X 6 MM MISC, USE 1  ONCE DAILY, Disp: 50 each, Rfl: 11   Lancets (ONETOUCH ULTRASOFT) lancets, Check blood sugar twice daily before meals, Disp: 300 each, Rfl: 3   LANTUS  SOLOSTAR 100 UNIT/ML Solostar Pen, INJECT 10 UNITS SUBCUTANEOUSLY BEFORE BREAKFAST IN THE MORNING, Disp: 15 mL, Rfl: 7   losartan -hydrochlorothiazide  (HYZAAR) 100-25 MG tablet, Take 1 tablet by mouth once daily, Disp: 90 tablet, Rfl: 3   metoprolol  succinate (TOPROL -XL) 100 MG 24 hr tablet, TAKE 1 TABLET BY MOUTH ONCE DAILY WITH  OR  FOLLOWING  A  MEAL, Disp: 90 tablet, Rfl: 3   nitroGLYCERIN  (NITROSTAT ) 0.4 MG SL tablet, 1 tab under tongue as needed for Chest discomfort and repeat every 5 minutes x 2 if discomfort not relieved (Patient not taking: Reported on 02/15/2024), Disp: 25 tablet, Rfl: 2   spironolactone  (ALDACTONE ) 25 MG tablet, Take 1 tablet by mouth once daily, Disp: 90 tablet, Rfl: 0   tirzepatide  (MOUNJARO ) 15 MG/0.5ML Pen, Inject 15 mg into the skin once a week., Disp: 6 mL, Rfl: 3 [2]  Allergies Allergen Reactions   Aspirin Anaphylaxis   Ibuprofen Anaphylaxis   Hydralazine  Other (See Comments)    polyarthralgias   Ace Inhibitors Cough   "

## 2024-02-18 NOTE — Progress Notes (Signed)
 Report given to PACU, vss

## 2024-02-21 ENCOUNTER — Other Ambulatory Visit: Payer: 59

## 2024-02-22 ENCOUNTER — Telehealth: Payer: Self-pay

## 2024-02-22 NOTE — Telephone Encounter (Signed)
" °  Follow up Call-     02/18/2024    8:24 AM  Call back number  Post procedure Call Back phone  # [phone]  Permission to leave phone message Yes     Patient questions:  Do you have a fever, pain , or abdominal swelling? No. Pain Score  0 *  Have you tolerated food without any problems? Yes.    Have you been able to return to your normal activities? Yes.    Do you have any questions about your discharge instructions: Diet   No. Medications  No. Follow up visit  No.  Do you have questions or concerns about your Care? No.  Actions: * If pain score is 4 or above: No action needed, pain <4.   "

## 2024-02-23 ENCOUNTER — Ambulatory Visit: Payer: Self-pay | Admitting: Gastroenterology

## 2024-02-23 LAB — SURGICAL PATHOLOGY

## 2024-02-24 ENCOUNTER — Other Ambulatory Visit: Payer: Self-pay

## 2024-02-24 ENCOUNTER — Telehealth: Payer: Self-pay

## 2024-02-24 DIAGNOSIS — K639 Disease of intestine, unspecified: Secondary | ICD-10-CM

## 2024-02-24 MED ORDER — NA SULFATE-K SULFATE-MG SULF 17.5-3.13-1.6 GM/177ML PO SOLN: 1.0000 | Freq: Once | ORAL | 0 refills | Status: AC

## 2024-02-24 NOTE — Telephone Encounter (Signed)
 Pt pathology letter noted that she is scheduled for EMR at [facility]. I wanted to make you aware of Dr. Melba  procedure report stating that he wanted a 2 day prep and a week of daily Miralax  and Dulcolax with standard prep.

## 2024-02-24 NOTE — Telephone Encounter (Signed)
 The pt has been advised to follow the recommendations per the procedure report. See below.  The pt has been advised of the information and verbalized understanding.

## 2024-02-29 ENCOUNTER — Inpatient Hospital Stay: Admission: RE | Admit: 2024-02-29 | Source: Ambulatory Visit

## 2024-02-29 ENCOUNTER — Other Ambulatory Visit: Admitting: Internal Medicine

## 2024-03-07 ENCOUNTER — Other Ambulatory Visit

## 2024-04-20 ENCOUNTER — Encounter (HOSPITAL_COMMUNITY): Payer: Self-pay

## 2024-04-20 ENCOUNTER — Ambulatory Visit (HOSPITAL_COMMUNITY): Admit: 2024-04-20 | Admitting: Gastroenterology

## 2024-08-15 ENCOUNTER — Ambulatory Visit: Admitting: Internal Medicine
